# Patient Record
Sex: Female | Born: 1940 | Race: White | Hispanic: No | Marital: Married | Smoking: Never smoker
Health system: Southern US, Community
[De-identification: ages and names within clinical notes are randomized; demographics above are authoritative.]

## PROBLEM LIST (undated history)

## (undated) DIAGNOSIS — E039 Hypothyroidism, unspecified: Secondary | ICD-10-CM

## (undated) DIAGNOSIS — R5381 Other malaise: Secondary | ICD-10-CM

## (undated) DIAGNOSIS — R5383 Other fatigue: Secondary | ICD-10-CM

## (undated) DIAGNOSIS — Z85828 Personal history of other malignant neoplasm of skin: Secondary | ICD-10-CM

## (undated) DIAGNOSIS — R0789 Other chest pain: Secondary | ICD-10-CM

## (undated) DIAGNOSIS — K5730 Diverticulosis of large intestine without perforation or abscess without bleeding: Secondary | ICD-10-CM

## (undated) DIAGNOSIS — K649 Unspecified hemorrhoids: Secondary | ICD-10-CM

## (undated) DIAGNOSIS — I10 Essential (primary) hypertension: Secondary | ICD-10-CM

## (undated) DIAGNOSIS — F411 Generalized anxiety disorder: Secondary | ICD-10-CM

## (undated) DIAGNOSIS — M810 Age-related osteoporosis without current pathological fracture: Secondary | ICD-10-CM

## (undated) HISTORY — PX: OTHER SURGICAL HISTORY: SHX169

## (undated) HISTORY — DX: Unspecified hemorrhoids: K64.9

## (undated) HISTORY — DX: Other chest pain: R07.89

## (undated) HISTORY — DX: Age-related osteoporosis without current pathological fracture: M81.0

## (undated) HISTORY — PX: COLONOSCOPY: SHX174

## (undated) HISTORY — DX: Hypothyroidism, unspecified: E03.9

## (undated) HISTORY — DX: Personal history of other malignant neoplasm of skin: Z85.828

## (undated) HISTORY — DX: Generalized anxiety disorder: F41.1

## (undated) HISTORY — DX: Diverticulosis of large intestine without perforation or abscess without bleeding: K57.30

## (undated) HISTORY — DX: Other fatigue: R53.83

## (undated) HISTORY — DX: Other malaise: R53.81

---

## 1997-09-10 ENCOUNTER — Ambulatory Visit (HOSPITAL_COMMUNITY): Admission: RE | Admit: 1997-09-10 | Discharge: 1997-09-10 | Payer: Self-pay | Admitting: *Deleted

## 1997-09-10 ENCOUNTER — Other Ambulatory Visit: Admission: RE | Admit: 1997-09-10 | Discharge: 1997-09-10 | Payer: Self-pay | Admitting: *Deleted

## 1998-05-05 ENCOUNTER — Ambulatory Visit (HOSPITAL_COMMUNITY): Admission: RE | Admit: 1998-05-05 | Discharge: 1998-05-05 | Payer: Self-pay | Admitting: Internal Medicine

## 1998-05-05 ENCOUNTER — Encounter: Payer: Self-pay | Admitting: Internal Medicine

## 1998-09-17 ENCOUNTER — Other Ambulatory Visit: Admission: RE | Admit: 1998-09-17 | Discharge: 1998-09-17 | Payer: Self-pay | Admitting: *Deleted

## 2000-05-09 ENCOUNTER — Other Ambulatory Visit: Admission: RE | Admit: 2000-05-09 | Discharge: 2000-05-09 | Payer: Self-pay | Admitting: *Deleted

## 2000-05-17 ENCOUNTER — Ambulatory Visit (HOSPITAL_COMMUNITY): Admission: RE | Admit: 2000-05-17 | Discharge: 2000-05-17 | Payer: Self-pay | Admitting: Internal Medicine

## 2000-05-17 ENCOUNTER — Encounter: Payer: Self-pay | Admitting: Internal Medicine

## 2002-08-20 ENCOUNTER — Ambulatory Visit (HOSPITAL_COMMUNITY): Admission: RE | Admit: 2002-08-20 | Discharge: 2002-08-20 | Payer: Self-pay | Admitting: Pulmonary Disease

## 2002-08-20 ENCOUNTER — Encounter: Payer: Self-pay | Admitting: Pulmonary Disease

## 2004-03-18 ENCOUNTER — Ambulatory Visit: Payer: Self-pay | Admitting: Pulmonary Disease

## 2004-11-18 ENCOUNTER — Ambulatory Visit: Payer: Self-pay | Admitting: Pulmonary Disease

## 2004-12-24 ENCOUNTER — Ambulatory Visit: Payer: Self-pay | Admitting: Pulmonary Disease

## 2005-12-21 ENCOUNTER — Ambulatory Visit: Payer: Self-pay | Admitting: Pulmonary Disease

## 2006-01-18 ENCOUNTER — Ambulatory Visit: Payer: Self-pay | Admitting: Pulmonary Disease

## 2006-01-18 ENCOUNTER — Ambulatory Visit: Payer: Self-pay | Admitting: Internal Medicine

## 2006-01-19 ENCOUNTER — Ambulatory Visit: Payer: Self-pay

## 2006-11-29 ENCOUNTER — Ambulatory Visit: Payer: Self-pay | Admitting: Pulmonary Disease

## 2006-12-03 ENCOUNTER — Emergency Department (HOSPITAL_COMMUNITY): Admission: EM | Admit: 2006-12-03 | Discharge: 2006-12-03 | Payer: Self-pay | Admitting: Emergency Medicine

## 2007-05-03 DIAGNOSIS — E039 Hypothyroidism, unspecified: Secondary | ICD-10-CM

## 2007-05-03 DIAGNOSIS — R5383 Other fatigue: Secondary | ICD-10-CM

## 2007-05-03 DIAGNOSIS — F411 Generalized anxiety disorder: Secondary | ICD-10-CM

## 2007-05-03 DIAGNOSIS — R5381 Other malaise: Secondary | ICD-10-CM | POA: Insufficient documentation

## 2007-05-03 DIAGNOSIS — K649 Unspecified hemorrhoids: Secondary | ICD-10-CM

## 2007-05-04 ENCOUNTER — Ambulatory Visit: Payer: Self-pay | Admitting: Pulmonary Disease

## 2007-05-04 DIAGNOSIS — K5730 Diverticulosis of large intestine without perforation or abscess without bleeding: Secondary | ICD-10-CM

## 2007-05-04 DIAGNOSIS — Z85828 Personal history of other malignant neoplasm of skin: Secondary | ICD-10-CM

## 2007-05-04 DIAGNOSIS — R0789 Other chest pain: Secondary | ICD-10-CM | POA: Insufficient documentation

## 2007-05-09 LAB — CONVERTED CEMR LAB
ALT: 19 U/L
AST: 35 U/L
Albumin: 4.1 g/dL
Alkaline Phosphatase: 55 U/L
BUN: 12 mg/dL
Basophils Absolute: 0 K/uL
Basophils Relative: 1 %
Bilirubin, Direct: 0.6 mg/dL — ABNORMAL HIGH
CO2: 30 meq/L
Calcium: 9.5 mg/dL
Chloride: 99 meq/L
Cholesterol: 199 mg/dL
Creatinine, Ser: 0.8 mg/dL
Eosinophils Absolute: 0.1 K/uL
Eosinophils Relative: 1.6 %
GFR calc Af Amer: 92 mL/min
GFR calc non Af Amer: 76 mL/min
Glucose, Bld: 116 mg/dL — ABNORMAL HIGH
HCT: 39 %
HDL: 74.5 mg/dL
Hemoglobin: 13.1 g/dL
Hgb A1c MFr Bld: 5.7 %
LDL Cholesterol: 107 mg/dL — ABNORMAL HIGH
Lymphocytes Relative: 36.8 %
MCHC: 33.7 g/dL
MCV: 92.2 fL
Monocytes Absolute: 0.3 K/uL
Monocytes Relative: 8.2 %
Neutro Abs: 2.3 K/uL
Neutrophils Relative %: 52.4 %
Platelets: 194 K/uL
Potassium: 4.5 meq/L
RBC: 4.23 M/uL
RDW: 12.5 %
Sodium: 136 meq/L
TSH: 1.45 u[IU]/mL
Total Bilirubin: 1.5 mg/dL — ABNORMAL HIGH
Total CHOL/HDL Ratio: 2.7
Total Protein: 7 g/dL
Triglycerides: 88 mg/dL
VLDL: 18 mg/dL
WBC: 4.2 10*3/microliter — ABNORMAL LOW

## 2008-01-02 ENCOUNTER — Telehealth (INDEPENDENT_AMBULATORY_CARE_PROVIDER_SITE_OTHER): Payer: Self-pay | Admitting: *Deleted

## 2008-01-28 ENCOUNTER — Encounter: Payer: Self-pay | Admitting: Pulmonary Disease

## 2008-02-07 ENCOUNTER — Ambulatory Visit: Payer: Self-pay | Admitting: Pulmonary Disease

## 2008-02-07 LAB — CONVERTED CEMR LAB
BUN: 13 mg/dL (ref 6–23)
CO2: 29 meq/L (ref 19–32)
Calcium: 9.7 mg/dL (ref 8.4–10.5)
Chloride: 104 meq/L (ref 96–112)
Cholesterol: 183 mg/dL (ref 0–200)
Creatinine, Ser: 0.7 mg/dL (ref 0.4–1.2)
GFR calc Af Amer: 107 mL/min
GFR calc non Af Amer: 88 mL/min
Glucose, Bld: 116 mg/dL — ABNORMAL HIGH (ref 70–99)
HDL: 86.3 mg/dL (ref >39.0)
LDL Cholesterol: 83 mg/dL (ref 0–99)
Potassium: 4.1 meq/L (ref 3.5–5.1)
Sodium: 140 meq/L (ref 135–145)
TSH: 0.15 microintl units/mL — ABNORMAL LOW (ref 0.35–5.50)
Total CHOL/HDL Ratio: 2.1
Triglycerides: 68 mg/dL (ref 0–149)
VLDL: 14 mg/dL (ref 0–40)

## 2008-02-26 ENCOUNTER — Telehealth (INDEPENDENT_AMBULATORY_CARE_PROVIDER_SITE_OTHER): Payer: Self-pay | Admitting: *Deleted

## 2008-03-11 ENCOUNTER — Telehealth: Payer: Self-pay | Admitting: Pulmonary Disease

## 2008-03-25 ENCOUNTER — Ambulatory Visit: Payer: Self-pay | Admitting: Pulmonary Disease

## 2008-03-31 ENCOUNTER — Encounter: Admission: RE | Admit: 2008-03-31 | Discharge: 2008-03-31 | Payer: Self-pay | Admitting: Pulmonary Disease

## 2008-03-31 LAB — CONVERTED CEMR LAB: TSH: 0.08 microintl units/mL — ABNORMAL LOW (ref 0.35–5.50)

## 2008-04-29 ENCOUNTER — Ambulatory Visit: Payer: Self-pay | Admitting: Pulmonary Disease

## 2008-05-01 ENCOUNTER — Ambulatory Visit (HOSPITAL_COMMUNITY): Admission: RE | Admit: 2008-05-01 | Discharge: 2008-05-01 | Payer: Self-pay | Admitting: Pulmonary Disease

## 2008-05-06 LAB — CONVERTED CEMR LAB
ALT: 23 units/L (ref 0–35)
Albumin: 4.1 g/dL (ref 3.5–5.2)
Basophils Relative: 1 % (ref 0.0–3.0)
Bilirubin, Direct: 0.1 mg/dL (ref 0.0–0.3)
CO2: 32 meq/L (ref 19–32)
Chloride: 103 meq/L (ref 96–112)
Creatinine, Ser: 0.8 mg/dL (ref 0.4–1.2)
Eosinophils Absolute: 0.1 10*3/uL (ref 0.0–0.7)
Eosinophils Relative: 1.1 % (ref 0.0–5.0)
HCT: 40 % (ref 36.0–46.0)
Hemoglobin: 13.8 g/dL (ref 12.0–15.0)
Hgb A1c MFr Bld: 5.8 % (ref 4.6–6.5)
MCHC: 34.5 g/dL (ref 30.0–36.0)
MCV: 90.7 fL (ref 78.0–100.0)
Monocytes Absolute: 0.3 10*3/uL (ref 0.1–1.0)
Neutro Abs: 3 10*3/uL (ref 1.4–7.7)
Neutrophils Relative %: 62.3 % (ref 43.0–77.0)
Potassium: 4.7 meq/L (ref 3.5–5.1)
RBC: 4.42 M/uL (ref 3.87–5.11)
Sodium: 141 meq/L (ref 135–145)
Total Protein: 7.2 g/dL (ref 6.0–8.3)
WBC: 4.9 10*3/uL (ref 4.5–10.5)

## 2009-04-20 ENCOUNTER — Telehealth: Payer: Self-pay | Admitting: Pulmonary Disease

## 2009-05-27 ENCOUNTER — Ambulatory Visit: Payer: Self-pay | Admitting: Pulmonary Disease

## 2009-05-27 ENCOUNTER — Encounter: Payer: Self-pay | Admitting: Adult Health

## 2009-05-29 LAB — CONVERTED CEMR LAB
AST: 20 units/L (ref 0–37)
Alkaline Phosphatase: 57 units/L (ref 39–117)
Basophils Absolute: 0 10*3/uL (ref 0.0–0.1)
Bilirubin, Direct: 0.1 mg/dL (ref 0.0–0.3)
CO2: 32 meq/L (ref 19–32)
Calcium: 9.5 mg/dL (ref 8.4–10.5)
Creatinine, Ser: 0.7 mg/dL (ref 0.4–1.2)
Direct LDL: 107.3 mg/dL
Eosinophils Absolute: 0 10*3/uL (ref 0.0–0.7)
GFR calc non Af Amer: 87.92 mL/min (ref >60)
Glucose, Bld: 111 mg/dL — ABNORMAL HIGH (ref 70–99)
HDL: 76.6 mg/dL (ref >39.00)
Lymphocytes Relative: 29.7 % (ref 12.0–46.0)
MCHC: 34.5 g/dL (ref 30.0–36.0)
Monocytes Relative: 5.4 % (ref 3.0–12.0)
Neutrophils Relative %: 63 % (ref 43.0–77.0)
RBC: 4.48 M/uL (ref 3.87–5.11)
RDW: 13.4 % (ref 11.5–14.6)
Total CHOL/HDL Ratio: 3
Triglycerides: 148 mg/dL (ref 0.0–149.0)
VLDL: 29.6 mg/dL (ref 0.0–40.0)

## 2009-09-16 ENCOUNTER — Encounter (INDEPENDENT_AMBULATORY_CARE_PROVIDER_SITE_OTHER): Payer: Self-pay | Admitting: *Deleted

## 2010-01-15 ENCOUNTER — Telehealth (INDEPENDENT_AMBULATORY_CARE_PROVIDER_SITE_OTHER): Payer: Self-pay | Admitting: *Deleted

## 2010-03-18 NOTE — Progress Notes (Signed)
Summary: lorazepam  Phone Note Call from Patient   Caller: Patient Call For: Iva Montelongo Summary of Call: pt wants rx of lorazepam. she wants 1mg  (so she can split this in half) says the last rx given to her has expired- it was for 0.5mg . [facility] [address]. call pt on cell [phone]. pt wants a 90 days supply as well Initial call taken by: Tivis Ringer, CNA,  April 20, 2009 12:14 PM  Follow-up for Phone Call        please advise if ok to send rx as pt requested. pt last seen 04/29/2008 with no schedueld appts. Carron Curie CMA  April 20, 2009 12:33 PM  ok for the #90 but only for the 0.5mg ---we can not write for the 1 mg she will need ov to discuss this med with SN----thanks Randell Loop CMA  April 20, 2009 12:49 PM   pt advised and is ok with 0.5 mg tabs. i advised pt she needs to schedule appt because it has been 1 year fince appt. I advised she will not be able to get any refills unless she schedules an appt. Sh states she will call to schedue. Rx sent. Carron Curie CMA  April 20, 2009 2:42 PM     Prescriptions: LORAZEPAM 0.5 MG  TABS (LORAZEPAM) take one tablet by mouth at bedtime as needed for sleep...  #90 x 0   Entered by:   Carron Curie CMA   Authorized by:   Michele Mcalpine MD   Signed by:   Carron Curie CMA on 04/20/2009   Method used:   Telephoned to ...       [facility] ([facility]       [address]       [place], [place]  [address]       Ph: [phone]       Fax: [phone]   RxID:   [id number]

## 2010-03-18 NOTE — Assessment & Plan Note (Signed)
Summary: Ashley [name]   CC:  yearly follow up .  History of Present Illness: 70  y/o WF with known history of hypothyroidim, anxiety, osteoporosis.   02/07/08--/follow up visit... she notes that she has had considerable dental problems this year and feels the Actonel was responsible- after stopping the Actonel for the last 2 months things have improved... she had a f/u BMD at he GYN office w/ stable osteopenia and she is going to leave the Actonel off and treat her bones w/ calcium, vitamins, and vit D...  March 25, 2008-had TSH drawn 02-07-08, level was low and synthroid was decreased to but pt is still experiencing discomfort in left throat area, underarm arm area concerned this is related to her thyroid. She complains of multiple complaints today, aching along upper neck shoulder back under arm. nervous lately, does not respond to stress events very well, worrying alot. Husband sick recently w/ a-fib. no chest pain, dyspnea, rash, joint swelling, n/v/d, abd. pain. Has been having heartburn, belching lastely. uses lorazepam 1-2 daily.   May 27, 2009--Presents for Yearly Follow up.  Requesting blood work today - is fasting. She is feeling good. Her and husband have been doing alot of home remodeling. She is very active. Has not been to gym lately but starting back when they finish house worl. She has been under alot of family stress. She needs refills. Would like labs mailed to her. She is due for her routine colonoscopy. Denies chest pain, dyspnea, orthopnea, hemoptysis, fever, n/v/d, edema, headache,bloody stools or weight loss.    Preventive Screening-Counseling & Management  Alcohol-Tobacco     Smoking Status: never  Current Medications (verified): 1)  Adult Aspirin Low Strength 81 Mg  Tbdp (Aspirin) .... Take 1 Tablet By Mouth Once A Day 2)  Synthroid 75 Mcg Tabs (Levothyroxine Sodium) .... Take 1 Tablet By Mouth Once A Day 3)  Lorazepam 0.5 Mg  Tabs (Lorazepam) .... Take One  Tablet By Mouth At Bedtime As Needed For Sleep.Marland KitchenMarland Kitchen 4)  Caltrate 600+d 600-400 Mg-Unit  Tabs (Calcium Carbonate-Vitamin D) .... Take One Tab By Mouth Two Times A Day For Bone Health... 5)  Vitamin D 1000 Unit Caps (Cholecalciferol) .... Take 1 Cap By Mouth Once Daily.Marland KitchenMarland Kitchen 6)  Sertraline Hcl 25 Mg Tabs (Sertraline Hcl) .... Take One Tablet By Mouth Once Daily  Allergies (verified): 1)  ! Penicillin 2)  ! Evista (Raloxifene Hcl) 3)  ! Fosamax (Alendronate Sodium) 4)  ! Actonel (Risedronate Sodium)  Past History:  Family History: Last updated: 05/27/07 Father died age 56 in MVA Mother died age 2 from ovarian cancer 3 Sibs: 1Bro died age 38 w/ pancreatic cancer, hx DM;             1Bro has MS  Social History: Last updated: 05/27/2009 Patient never smoked.  married 2 children no alcohol  Risk Factors: Smoking Status: never (05/27/2009)  Past Medical History: Hx of CHEST PAIN, ATYPICAL (ICD-786.59) - on ASA 81mg /d...  ~  NuclearStressTest 3/02 was normal w/o scar or ischemia, EF=81%.  ~  ABI's 12/07 were normal (c/o pain & numbness in legs).  HYPOTHYROIDISM (ICD-244.9) - ** see above ** on SYNTHROID... she doesn't want generic... on Synthroid x yrs w/ TSH  ~14 in 1983 & med started by DrESL- she had a norm I-131 uptake & scan.   ~  labs 2/10 showed TSH= 0.08... rec- decr to 1/2 tab daily...  ~  labs 3/10 showed = 13, adjusted.   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 f/u planned 42yrs.  OSTEOPOROSIS (ICD-733.00) - prev on ACTONEL 35mg /wk (DC'd 9/09), plus ca++ & vits...  ~  BMD 12/07 showed TScores -0.8 (hips) to -2.0 (spine)... sl improved from 2005 and before.  ~  pt states VitD level was OK per DrMcPhail...  ~  repeat BMD by [facility] showed TScores -1.3 R FemNeck & -2.0 in Spine... she has stopped the Bisphos therapy due to dental  problems and feels she is improved off this med...  FATIGUE (ICD-780.79)  ANXIETY (ICD-300.00) - on LORAZEPAM 0.5mg  as needed... usually takes 1 Qhs...  ~  3/10:  she relates a story where her brother developed DM and later died from pancreatic cancer...  --abd [place] neg. 3/10  SKIN CANCER, HX OF (ICD-V10.83) - DrHall removed a squamous cell lesion from chest wall... SHINGLES - she had a bout of shingles Oct08 on her left hip area, treated at the ER w/ cortisone shot and Famvir (she refused Pred tablets due to her osteopenia)...   HEALTH MAINTENANCE -   ~  FLP 12/07 showed TChol 189, TG 102, HDL 70, LDL 99... on diet alone.  ~  FLP 3/09 showed TChol 199, TG 75, HDL 88, LDL 107...  ~  GYN= [facility] now & she saw Debbora Dus 12/09- all neg per pt.      Social History: Patient never smoked.  married 2 children no alcohol  Review of Systems      See HPI  Vital Signs:  Patient profile:   70 year old female Height:      66 inches Weight:      158.13 pounds BMI:     25.62 O2 Sat:      96 % on Room air Temp:     97.2 degrees F oral Pulse rate:   62 / minute BP sitting:   134 / 84  (left arm) Cuff size:   regular  Vitals Entered By: Gweneth Dimitri RN (May 27, 2009 9:48 AM)  O2 Flow:  Room air CC: yearly follow up  Is Patient Diabetic? No Comments Medications reviewed with patient Daytime contact number verified with patient. Gweneth Dimitri RN  May 27, 2009 9:49 AM    Physical Exam  Additional Exam:  WD, WN, 70 y/o WF in NAD... GENERAL:  Alert & oriented; pleasant & cooperative... HEENT:  [place]/AT, EOM-wnl, PERRLA, EACs-clear, TMs-wnl, NOSE-clear, THROAT-clear & wnl. NECK:  Supple w/ full ROM; no JVD; normal carotid impulses w/o bruits; no thyromegaly or nodules palpated; no lymphadenopathy. CHEST:  Clear to P & A; without wheezes/ rales/ or rhonchi. HEART:  Regular Rhythm; without murmurs/ rubs/ or gallops. ABDOMEN:  Soft & nontender; normal bowel sounds; no  organomegaly or masses detected; +left fem bruit. EXT: without deformities or arthritic changes; no varicose veins/ venous insuffic/ or edema. NEURO:  CN's intact;  no focal neuro deficits... DERM:  No lesions noted; no rash etc...     Impression & Recommendations:  Problem # 1:  HYPOTHYROIDISM (ICD-244.9)  TSH pending today. adjust meds accordingly  Her updated medication list for this problem includes:    Synthroid 75 Mcg Tabs (Levothyroxine sodium) .Marland Kitchen... Take 1 tablet by mouth once a day  Orders: TLB-BMP (Basic Metabolic Panel-BMET) (80048-METABOL) TLB-CBC Platelet - w/Differential (85025-CBCD) TLB-Hepatic/Liver Function Pnl (80076-HEPATIC) TLB-TSH (Thyroid Stimulating Hormone) (84443-TSH) TLB-Lipid Panel (80061-LIPID) Est. Patient  Level IV ([address]) Prescription Created Electronically [phone])  Problem # 2:  OSTEOPOROSIS (ICD-733.00) cont on calcium/ vit d , wt bearing exercises BMD per GYN   Problem # 3:  ANXIETY (ICD-300.00)  she is on longer taking sertraline. only took for short time.  may use ativan as needed .   advised on stress reducers   The following medications were removed from the medication list:    Sertraline Hcl 25 Mg Tabs (Sertraline hcl) .Marland Kitchen... Take one tablet by mouth once daily Her updated medication list for this problem includes:    Lorazepam 0.5 Mg Tabs (Lorazepam) .Marland Kitchen... Take one tablet by mouth at bedtime as needed for sleep...  Orders: Est. Patient Level IV ([address])  Problem # 4:  DIVERTICULOSIS OF COLON (ICD-562.10)  set up for routine colonoscopy.  Orders: TLB-CBC Platelet - w/Differential (85025-CBCD) TLB-Hepatic/Liver Function Pnl (80076-HEPATIC) Est. Patient Level IV ([address])  Labs Reviewed: Hgb: 13.8 (04/29/2008)   Hct: 40.0 (04/29/2008)   WBC: 4.9 (04/29/2008)  Medications Added to Medication List This Visit: 1)  Sertraline Hcl 25 Mg Tabs (Sertraline hcl) .... Take one tablet by mouth once daily  Complete Medication List: 1)   Adult Aspirin Low Strength 81 Mg Tbdp (Aspirin) .... Take 1 tablet by mouth once a day 2)  Synthroid 75 Mcg Tabs (Levothyroxine sodium) .... Take 1 tablet by mouth once a day 3)  Caltrate 600+d 600-400 Mg-unit Tabs (Calcium carbonate-vitamin d) .... Take one tab by mouth two times a day for bone health... 4)  Vitamin D 1000 Unit Caps (Cholecalciferol) .... Take 1 cap by mouth once daily.Marland KitchenMarland Kitchen 5)  Lorazepam 0.5 Mg Tabs (Lorazepam) .... Take one tablet by mouth at bedtime as needed for sleep...  Other Orders: T-Vitamin D (25-Hydroxy) [phone])  Patient Instructions: 1)  We are referring you GI for routine colonoscopy due in May.  2)  Continue for follow up Mammogram and Pap smears-w/ [facility] 3)  Continue on same meds.  4)  I will call with lab results.  5)  Continue on diet and exercise.  6)  follow up Dr. Kriste Basque in 6-12 months and as needed  Prescriptions: SYNTHROID 75 MCG TABS (LEVOTHYROXINE SODIUM) Take 1 tablet by mouth once a day  #90 x 3   Entered and Authorized by:   Rubye Oaks NP   Signed by:   Rubye Oaks NP on 05/27/2009   Method used:   Electronically to        [facility] ([facility]ail)       [address]       [place], [place]  [address]       Ph: [phone]       Fax: [phone]   RxID:   [id number]    Immunization History:  Influenza Immunization History:    Influenza:  historical (11/14/2008)  Pneumovax Immunization History:    Pneumovax:  historical (05/16/2007)

## 2010-03-18 NOTE — Letter (Signed)
Summary: Colonoscopy Letter  [facility]  [address] [place], [place] [address]   Phone: [phone]  Fax: [phone]      September 16, 2009 MRN: [id number]   [facility] [address] [place], [place]  [address]   Dear Ms. Presley,   According to your medical record, it is time for you to schedule a Colonoscopy. The American Cancer Society recommends this procedure as a method to detect early colon cancer. Patients with a family history of colon cancer, or a personal history of colon polyps or inflammatory bowel disease are at increased risk.  This letter has been generated based on the recommendations made at the time of your procedure. If you feel that in your particular situation this may no longer apply, please contact our office.  Please call our office at [phone] to schedule this appointment or to update your records at your earliest convenience.  Thank you for cooperating with [place] to provide you with the very best care possible.   Sincerely,   Barbette Hair. Arlyce Dice, M.D.  [facility] [phone]

## 2010-03-18 NOTE — Progress Notes (Signed)
Summary: prescription > synthroid mailed to pt's home  Phone Note Call from Patient Call back at [facility] Phone [phone]   Caller: Patient Call For: NADEL Summary of Call: Pt states when her synthroid was lowered to ., her symptoms were tiredness, sleepiness, aches in her body, and when she resumed her old rx synthroid , all these symptoms disappeared therefore she wants a written rx for synthroid mailed to her home, pls advise. Initial call taken by: Darletta Moll,  January 15, 2010 9:11 AM  Follow-up for Phone Call        spoke with pt and she states she was on Synthroid but was having some tiredness, sleepiness, and body aches, so she started taking Synthroid . She states she was on this prev and had some left over. She states she has been on the x 3 months now and her symptoms have go away. She is now requesting a rx for symthroid . Please advise. Carron Curie CMA  January 15, 2010 10:19 AM   Additional Follow-up for Phone Call Additional follow up Details #1::        per SN: okay for synthroid , 30 or 90 day supply.  1 by mouth once daily , refill as needed.  called spoke with patient, advised of SN's recs as stated above.  pt verbalized her understanding.  requesting rx be mailed to her home.  home address verified with patient.  rx signed by SN and placed in the mail. Boone Master CNA/MA  January 15, 2010 4:23 PM     New/Updated Medications: SYNTHROID 112 MCG TABS (LEVOTHYROXINE SODIUM) Take 1 tablet by mouth once a day Prescriptions: SYNTHROID 112 MCG TABS (LEVOTHYROXINE SODIUM) Take 1 tablet by mouth once a day  #90 x 4   Entered by:   Boone Master CNA/MA   Authorized by:   Michele Mcalpine MD   Signed by:   Boone Master CNA/MA on 01/15/2010   Method used:   Print then Give to Patient   RxID:   [id number]

## 2010-04-27 ENCOUNTER — Telehealth (INDEPENDENT_AMBULATORY_CARE_PROVIDER_SITE_OTHER): Payer: Self-pay | Admitting: *Deleted

## 2010-04-29 ENCOUNTER — Encounter: Payer: Self-pay | Admitting: Adult Health

## 2010-04-29 ENCOUNTER — Ambulatory Visit (INDEPENDENT_AMBULATORY_CARE_PROVIDER_SITE_OTHER): Payer: Medicare Other | Admitting: Adult Health

## 2010-04-29 DIAGNOSIS — R030 Elevated blood-pressure reading, without diagnosis of hypertension: Secondary | ICD-10-CM

## 2010-05-04 ENCOUNTER — Telehealth: Payer: Self-pay | Admitting: Pulmonary Disease

## 2010-05-04 NOTE — Telephone Encounter (Signed)
Pt was just here on 04/29/10 and was going to keep log of b/p  It is fine for her to come in for bmet  But if she wants rx will need ov

## 2010-05-04 NOTE — Telephone Encounter (Signed)
Called and spoke with patient and per TP pt will come in on 3-21 for BMP to check her sugar---pt voiced her understanding of this.

## 2010-05-04 NOTE — Telephone Encounter (Signed)
LMOMTCB

## 2010-05-04 NOTE — Telephone Encounter (Signed)
Called and spoke with pt and she states she has an apt with Dr. Kriste Basque 4/12 and wants top go ahead and haver her labs done. Pt states this is bc she has been waking up in the mornings and "feeling strange in her head" and feels shaky and weak during the day. Pt states her BP has been staying high aroung 152/90. Pt wanted to come in and just have her blood sugar checked but i informed her we do not do do that unless she comes in for ov. Pt states then she would like to have her labs done. Please advise Dr. Kriste Basque if okay for pt to have labs done. Thanks Carver [place], [place]

## 2010-05-04 NOTE — Progress Notes (Signed)
Summary: bp slightly elevated wants to know if she should come in before   Phone Note Call from Patient   Caller: Patient Call For: NADEL Summary of Call: Patient phoned she has an appt with Dr Kriste Basque on 4/12 but her BP has been slightoly elevated running around 160/82 to 140/75 but mostly in the 140 range. She stated that she has had some stressful family issues. She wants to know if she needs to come in and have it checked or if it can wait until her April 12 visit. Patient can be reached at [phone] Initial call taken by: Vedia Coffer,  April 27, 2010 10:53 AM  Follow-up for Phone Call        Spoke with pt.  She states that she is having elevated BP readings.  She states that the diastolic number is okay but systolic is usually in the upper 160's.  She states that she does not feel "too bad"- but states does not feel like herself and sometimes feels tigh in her chest.  No vision changes or HA.  I advised needs ov and so sched her to see TP at 9:15 on 3/15 and advised call sooner if it gets worse or go to ED if needed. Pt verbalized understanding.  Follow-up by: Vernie Murders,  April 27, 2010 11:02 AM

## 2010-05-05 ENCOUNTER — Ambulatory Visit: Payer: Medicare Other

## 2010-05-05 DIAGNOSIS — I10 Essential (primary) hypertension: Secondary | ICD-10-CM

## 2010-05-05 LAB — BASIC METABOLIC PANEL
BUN: 14 mg/dL (ref 6–23)
Calcium: 9.6 mg/dL (ref 8.4–10.5)
Chloride: 102 mEq/L (ref 96–112)
Creatinine, Ser: 0.7 mg/dL (ref 0.4–1.2)
GFR: 83.53 mL/min (ref >60.00)

## 2010-05-06 ENCOUNTER — Telehealth: Payer: Self-pay | Admitting: *Deleted

## 2010-05-06 NOTE — Telephone Encounter (Signed)
Called and spoke with pt about her lab results per SN---BMP is normal----kidney function and bs were normal.  This lab was done at pts request.

## 2010-05-13 NOTE — Assessment & Plan Note (Signed)
Summary: Acute NP office visit - HTN   CC:  elevated blood pressure .  History of Present Illness: 70  y/o WF with known history of hypothyroidim, anxiety, osteoporosis.   May 03, 2010 --Presents for work in visit. Complains of that blood pressure has been running high at times. She checks several times a day with labile readings 120-170/80-100. No associated headache, visual changes, chest pain or dyspnea. Denies caffeine, sudafed or NSAIDS. Denies chest pain, dyspnea, orthopnea, hemoptysis, fever, n/v/d, edema, headache. Per chart review b/p averaging 130-140/80-88 on past visits.   Medications Prior to Update: 1)  Adult Aspirin Low Strength 81 Mg  Tbdp (Aspirin) .... Take 1 Tablet By Mouth Once A Day 2)  Synthroid 112 Mcg Tabs (Levothyroxine Sodium) .... Take 1 Tablet By Mouth Once A Day 3)  Caltrate 600+d 600-400 Mg-Unit  Tabs (Calcium Carbonate-Vitamin D) .... Take One Tab By Mouth Two Times A Day For Bone Health... 4)  Vitamin D 1000 Unit Caps (Cholecalciferol) .... Take 1 Cap By Mouth Once Daily.Marland KitchenMarland Kitchen 5)  Lorazepam 0.5 Mg  Tabs (Lorazepam) .... Take One Tablet By Mouth At Bedtime As Needed For Sleep...  Current Medications (verified): 1)  Adult Aspirin Low Strength 81 Mg  Tbdp (Aspirin) .... Take 1 Tablet By Mouth Once A Day 2)  Synthroid 112 Mcg Tabs (Levothyroxine Sodium) .... Take 1 Tablet By Mouth Once A Day 3)  Caltrate 600+d 600-400 Mg-Unit  Tabs (Calcium Carbonate-Vitamin D) .... Take One Tab By Mouth Two Times A Day For Bone Health... 4)  Vitamin D 1000 Unit Caps (Cholecalciferol) .... Take 1 Cap By Mouth Once Daily.Marland KitchenMarland Kitchen 5)  Lorazepam 0.5 Mg  Tabs (Lorazepam) .... Take One Tablet By Mouth At Bedtime As Needed For Sleep...  Allergies (verified): 1)  ! Penicillin 2)  ! Evista (Raloxifene Hcl) 3)  ! Fosamax (Alendronate Sodium) 4)  ! Actonel (Risedronate Sodium)  Past History:  Family History: Last updated: 18-May-2007 Father died age 58 in MVA Mother died age 47 from  ovarian cancer 3 Sibs: 1Bro died age 91 w/ pancreatic cancer, hx DM;             1Bro has MS  Social History: Last updated: 05/27/2009 Patient never smoked.  married 2 children no alcohol  Risk Factors: Smoking Status: never (05/27/2009)  Past Medical History: Hx of CHEST PAIN, ATYPICAL (ICD-786.59) - on ASA 81mg /d...  ~  NuclearStressTest 3/02 was normal w/o scar or ischemia, EF=81%.  ~  ABI's 12/07 were normal (c/o pain & numbness in legs).  HYPOTHYROIDISM (ICD-244.9) - ** see above ** on SYNTHROID... she doesn't want generic... on Synthroid x yrs w/ TSH  ~14 in 1983 & med started by DrESL- she had a norm I-131 uptake & scan.   ~  labs 2/10 showed TSH= 0.08... rec- decr to 1/2 tab daily...  ~  labs 3/10 showed = 13, adjusted.   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 f/u planned 11yrs.  OSTEOPOROSIS (ICD-733.00) - prev on ACTONEL 35mg /wk (DC'd 9/09), plus ca++ & vits...  ~  BMD 12/07 showed TScores -0.8 (hips) to -2.0 (spine)... sl improved from 2005 and before.  ~  pt states VitD level was OK per DrMcPhail...  ~  repeat BMD by [facility] showed TScores -1.3 R FemNeck & -2.0 in Spine... she has stopped the Bisphos therapy due to dental problems  and feels she is improved off this med...  FATIGUE (ICD-780.79)  ANXIETY (ICD-300.00) - on LORAZEPAM 0.5mg  as needed... usually takes 1 Qhs...   SKIN CANCER, HX OF (ICD-V10.83) - DrHall removed a squamous cell lesion from chest wall... SHINGLES - she had a bout of shingles Oct08 on her left hip area, treated at the ER w/ cortisone shot and Famvir (she refused Pred tablets due to her osteopenia)...   HEALTH MAINTENANCE -   ~  FLP 12/07 showed TChol 189, TG 102, HDL 70, LDL 99... on diet alone.  ~  FLP 3/09 showed TChol 199, TG 75, HDL 88, LDL 107...  ~  GYN= [facility] now & she saw Debbora Dus 12/09- all neg per pt.      Review of Systems      See HPI  Vital Signs:  Patient profile:   70 year old female Height:      66 inches Weight:      158.13 pounds BMI:     25.62 O2 Sat:      100 % on Room air Temp:     97.0 degrees F oral Pulse rate:   63 / minute BP sitting:   132 / 84  (left arm) Cuff size:   regular  Vitals Entered By: Boone Master CNA/MA (April 29, 2010 9:44 AM)  O2 Flow:  Room air  Physical Exam  Additional Exam:  WD, WN, 70 y/o WF in NAD... GENERAL:  Alert & oriented; pleasant & cooperative... HEENT:  [place]/AT, EOM-wnl, PERRLA, EACs-clear, TMs-wnl, NOSE-clear, THROAT-clear & wnl. NECK:  Supple w/ full ROM; no JVD; normal carotid impulses w/o bruits; no thyromegaly or nodules palpated; no lymphadenopathy. CHEST:  Clear to P & A; without wheezes/ rales/ or rhonchi. HEART:  Regular Rhythm; without murmurs/ rubs/ or gallops. ABDOMEN:  Soft & nontender; normal bowel sounds; no organomegaly or masses detected; +left fem bruit. EXT: without deformities or arthritic changes; no varicose veins/ venous insuffic/ or edema. NEURO:  CN's intact;  no focal neuro deficits... DERM:  No lesions noted; no rash etc...     Impression & Recommendations:  Problem # 1:  ELEVATED BLOOD PRESSURE (ICD-796.2)  we discussed several options, verified her b/p machine.  no meds at this time will keep log at home and bring to next ov.  plan:  Check blood pressure daily, record in log.  Avoid ibuprofen, advil, aleve, etc Low salt diet Exercise and walking as tolerated.  follow up Dr. Kriste Basque in 2 weeks as planned and as needed  Please contact office for sooner follow up if symptoms do not improve or worsen   Orders: Est. Patient Level III (119[address])  Patient Instructions: 1)  Check blood pressure daily, record in log.  2)  Avoid ibuprofen, advil, aleve, etc 3)  Low salt diet 4)  Exercise and walking as tolerated.  5)  follow up Dr. Kriste Basque in 2 weeks as planned and as  needed  6)  Please contact office for sooner follow up if symptoms do not improve or worsen    Immunization History:  Influenza Immunization History:    Influenza:  historical (11/14/2009)

## 2010-05-25 ENCOUNTER — Encounter: Payer: Self-pay | Admitting: Pulmonary Disease

## 2010-05-27 ENCOUNTER — Encounter: Payer: Self-pay | Admitting: Pulmonary Disease

## 2010-05-27 ENCOUNTER — Other Ambulatory Visit (INDEPENDENT_AMBULATORY_CARE_PROVIDER_SITE_OTHER): Payer: Medicare Other

## 2010-05-27 ENCOUNTER — Ambulatory Visit (INDEPENDENT_AMBULATORY_CARE_PROVIDER_SITE_OTHER): Payer: Medicare Other | Admitting: Pulmonary Disease

## 2010-05-27 DIAGNOSIS — E7800 Pure hypercholesterolemia, unspecified: Secondary | ICD-10-CM

## 2010-05-27 DIAGNOSIS — R5381 Other malaise: Secondary | ICD-10-CM

## 2010-05-27 DIAGNOSIS — R030 Elevated blood-pressure reading, without diagnosis of hypertension: Secondary | ICD-10-CM

## 2010-05-27 DIAGNOSIS — E039 Hypothyroidism, unspecified: Secondary | ICD-10-CM

## 2010-05-27 DIAGNOSIS — R5383 Other fatigue: Secondary | ICD-10-CM

## 2010-05-27 LAB — CBC WITH DIFFERENTIAL/PLATELET
Basophils Absolute: 0 10*3/uL (ref 0.0–0.1)
Basophils Relative: 0.8 % (ref 0.0–3.0)
Eosinophils Absolute: 0 10*3/uL (ref 0.0–0.7)
Hemoglobin: 14.7 g/dL (ref 12.0–15.0)
Lymphocytes Relative: 31.3 % (ref 12.0–46.0)
MCHC: 34.5 g/dL (ref 30.0–36.0)
MCV: 92.8 fl (ref 78.0–100.0)
Monocytes Absolute: 0.3 10*3/uL (ref 0.1–1.0)
Neutro Abs: 2.7 10*3/uL (ref 1.4–7.7)
RDW: 12.8 % (ref 11.5–14.6)

## 2010-05-27 LAB — BASIC METABOLIC PANEL
BUN: 17 mg/dL (ref 6–23)
Calcium: 9.8 mg/dL (ref 8.4–10.5)
Creatinine, Ser: 0.7 mg/dL (ref 0.4–1.2)
GFR: 90.65 mL/min (ref >60.00)

## 2010-05-27 LAB — HEPATIC FUNCTION PANEL: Total Bilirubin: 0.6 mg/dL (ref 0.3–1.2)

## 2010-05-27 LAB — LIPID PANEL
Cholesterol: 195 mg/dL (ref 0–200)
LDL Cholesterol: 93 mg/dL (ref 0–99)
Triglycerides: 110 mg/dL (ref 0.0–149.0)
VLDL: 22 mg/dL (ref 0.0–40.0)

## 2010-05-27 MED ORDER — LORAZEPAM 1 MG PO TABS: 1.0000 mg | ORAL_TABLET | Freq: Three times a day (TID) | ORAL | Status: DC | PRN

## 2010-05-27 MED ORDER — LORAZEPAM 0.5 MG PO TABS: ORAL_TABLET | ORAL | Status: DC

## 2010-05-27 MED ORDER — LEVOTHYROXINE SODIUM 112 MCG PO TABS: 112.0000 ug | ORAL_TABLET | Freq: Every day | ORAL | Status: DC

## 2010-05-27 NOTE — Patient Instructions (Signed)
Today we updated your meds in our EPIC system...    We refilled Your Lorazepam & Levothyroid as we discussed...  Today we did your follow up Fasting blood work...    We will mail a copy to you for your records...  Try to continue your aerobic activity, it is also a great stress reliever...    Try to monitor your BP at home & call if you find the readings are consistantly >150/90...    Call for any questions...    Let's plan a routine follow up in 1 yr, but sooner if needed.Marland KitchenMarland Kitchen

## 2010-05-27 NOTE — Progress Notes (Signed)
Subjective:    Patient ID: Ashley [name], female    DOB: [birth year], 70 y.o.   MRN: [id number]  HPI 70 y/o WF here for a follow up visit...  She has mult medical problems including:  Hypothyroidism;  Divertics/ Hems;  DJD/ Osteopenia;  Anxiety...  ~  May 27, 2010:  2 yr ROV and she has been under a lot of stress w/ daughter's divorce "it's taking a toll on me";  She was having weak spells & BP sl labile but has settled at 140s/ 80s at home checks;  She notes that for the weak spells she will go to sleep for relief & wakes feeling better, also eating more freq smaller meals helps "even eating celery helps";  She requests increase in her Ativan Rx, fasting blood work, and Synthroid refill today...  Also c/o some right shoulder discomfort & inability to fully abduct right arm- [facility]- we discussed Ortho eval (she will call DrWainer when she is ready for this)...     Problem List:  Hx of CHEST PAIN, ATYPICAL (ICD-786.59) - on ASA 81mg /d... ~  NuclearStressTest 3/02 was normal w/o scar or ischemia, EF=81%. ~  ABI's 12/07 were normal (c/o pain & numbness in legs). ~  4/12:  She denies CP, palpit, dyspnea, syncope, edema, etc...  HYPOTHYROIDISM (ICD-244.9) - currently on SYNTHROID 163mcg/d she doesn't want generic...  ~  on Synthroid Rx x yrs w/ TSH ~14 in 1983 & med started by DrESL. ~  Thyroid Ultrasound 3/10 showed = sl goiter, no nodules seen. ~  Labs & Synthroid dose have been adjusted slightly over the yrs... ~  Labs 4/12 on Synth112 showed TSH= 0.30... rec sl decr to 125mcg/d.  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 f/u planned 51yrs. ~  4/12:  Reminded of the need for her f/u colon & she promises to call GI to set this up at her convenience.  DEGENERATIVE ARTHRITIS (ICD-715.90) - she uses OTC anti-inflamm meds Prn. ~  4/12: c/o right shoulder pain & decr ROM, advised to get  eval by Ortho & she will call DrWainer when she is ready.  OSTEOPOROSIS (ICD-733.00) - prev Actonel was stopped 9/09; taking Ca++/ Vits/ Vit D... ~  BMD 12/07 showed TScores -0.8 (hips) to -2.0 (spine)... sl improved from 2005 and before. ~  pt states VitD level was OK per DrMcPhail... ~  repeat BMD 2009 by [facility] showed TScores -1.3 R FemNeck & -2.0 in Spine... she has stopped the Bisphos therapy due to dental problems and feels she is improved off this med... ~  4/12:  She tells me that GYN continues to follow her BMDs & manages this prob for her.  FATIGUE (ICD-780.79)  ANXIETY (ICD-300.00) - on LORAZEPAM 0.5mg  as needed... usually takes 1 Qhs... ~  3/10:  she relates a story where her brother developed DM and later died from pancreatic cancer... she likes to get copies of all her lab work and noted her BS was 116 & she thinks that she is diabetic and may also have pancreatic cancer causing this... we discussed this in detail & I tried to reassure her--- we will check BS=109/ A1c=5.8/ Abd Ultrasound= WNL.Marland Kitchen. ~  4/12:  Under a lot of stress w/ daughter's divorce> she wants to incr the LORAZEPAM to 1mg  tabs- 1/2 to 1 tab Tid Prn...  SKIN CANCER, HX OF (ICD-V10.83) - DrHall removed a  squamous cell lesion from chest wall... SHINGLES - she had a bout of shingles Oct08 on her left hip area, treated at the ER w/ cortisone shot and Famvir (she refused Pred tablets due to her osteopenia)...  HEALTH MAINTENANCE -  ~  FLP 12/07 showed TChol 189, TG 102, HDL 70, LDL 99... on diet alone. ~  FLP 3/09 showed TChol 199, TG 75, HDL 88, LDL 107... ~  GYN= [facility] now & she saw Debbora Dus 12/09- all neg per pt.  No past surgical history on file.   Outpatient Encounter Prescriptions as of 05/27/2010  Medication Sig Dispense Refill  . aspirin 81 MG tablet Take 81 mg by mouth daily.        . Calcium Carbonate-Vitamin D (CALTRATE 600+D) 600-400 MG-UNIT per tablet Take 1 tablet by mouth 2 (two)  times daily.        . Cholecalciferol (VITAMIN D) 1000 UNITS capsule Take 1,000 Units by mouth daily.        Marland Kitchen levothyroxine (SYNTHROID, LEVOTHROID) 112 MCG tablet Take 112 mcg by mouth daily.    ==> DOSE DECREASED to 124mcg/d this visit...    . LORazepam (ATIVAN) 0.5 MG tablet Take 0.5 mg by mouth at bedtime as needed.    ==> DOSE Increased to 1mg  tabs this visit...      Allergies  Allergen Reactions  . Alendronate Sodium     REACTION: pt states "blisters in my mouth"  . Latex     sensitive  . Penicillins     REACTION: fatigue/malaise, "feels extremely bad"  . Raloxifene     REACTION: pt states "it dried me out"  . Risedronate Sodium     REACTION: pt states "teeth problems"    Review of Systems        See HPI - all other systems neg except as noted...       The patient c/o anxiety, weak spells, & pain in the right shoulder;  She denies anorexia, fever, weight loss, weight gain, vision loss, decreased hearing, hoarseness, chest pain, syncope, dyspnea on exertion, peripheral edema, prolonged cough, headaches, hemoptysis, abdominal pain, melena, hematochezia, severe indigestion/heartburn, hematuria, incontinence, suspicious skin lesions, transient blindness, difficulty walking, depression, unusual weight change, abnormal bleeding, enlarged lymph nodes, and angioedema.     Objective:   Physical Exam      WD, WN, 70 y/o WF in NAD... GENERAL:  Alert & oriented; pleasant & cooperative... HEENT:  [place]/AT, EOM-wnl, PERRLA, EACs-clear, TMs-wnl, NOSE-clear, THROAT-clear & wnl. NECK:  Supple w/ full ROM; no JVD; normal carotid impulses w/o bruits; no thyromegaly or nodules palpated; no lymphadenopathy. CHEST:  Clear to P & A; without wheezes/ rales/ or rhonchi. HEART:  Regular Rhythm; without murmurs/ rubs/ or gallops. ABDOMEN:  Soft & nontender; normal bowel sounds; no organomegaly or masses detected; +left fem bruit. EXT: without deformities, mild arthritic changes & decr ROM right shoulder;  no varicose veins/ venous insuffic/ or edema. NEURO:  CN's intact;  no focal neuro deficits... DERM:  No lesions noted; no rash etc...   Assessment & Plan:   HYPOTHYROIDISM>  She is clinically euthyroid but TSH returns sl over suppressed at 0.30 on the 112 mcg/d dose;  We discussed decr dose to 175mcg/d...  DIVERTICS>  She is overdue for her screening colon, last done 5/01 by Emiliano Dyer;  We offered to set this up for her but she wants to sched her own appt & will call.  OSTEOARTHRITIS>  She has some right shoulder pain &  decr ROM (can't abduct);  We have rec Ortho eval & she will call DrWainer when she is ready for this eval...  OSTEOPENIA>  She is on Caltrate/ MVI/ Vit D & she is followed by her GYN;  Advised to have f/u BMD on sched so they canaddress the need for medication if nec...  ANXIETY>  She requests incr in her Lorazepam to 1mg  tabs- 1/2 to 1 tab  Tid Prn.Marland KitchenMarland Kitchen

## 2010-05-29 ENCOUNTER — Encounter: Payer: Self-pay | Admitting: Pulmonary Disease

## 2010-05-29 MED ORDER — SYNTHROID 100 MCG PO TABS: 100.0000 ug | ORAL_TABLET | Freq: Every day | ORAL | Status: DC

## 2010-06-14 ENCOUNTER — Encounter: Payer: Self-pay | Admitting: Pulmonary Disease

## 2010-06-17 ENCOUNTER — Telehealth: Payer: Self-pay | Admitting: Pulmonary Disease

## 2010-06-17 MED ORDER — AMLODIPINE BESYLATE 5 MG PO TABS: 5.0000 mg | ORAL_TABLET | Freq: Every day | ORAL | Status: DC

## 2010-06-17 NOTE — Telephone Encounter (Signed)
Spoke w/ pt and she states she has been monitoring her BP. Pt states last night it was 190/86 and this morning it was 156/84. Pt states she has been just feeling "bad". Pt states Tammy advised her that if she continued to have this BP problem then she could possibly be put on a low dose BP medication. Pt is just worried that something may happen to her if she continues like this. Pt states if Dr. Kriste Basque wants her to wait and continue to monitor her BP she will before putting her on any bp medication. Please advise Dr. Kriste Basque. Thanks  Allergies  Allergen Reactions  . Alendronate Sodium     REACTION: pt states "blisters in my mouth"  . Latex     sensitive  . Penicillins     REACTION: fatigue/malaise, "feels extremely bad"  . Raloxifene     REACTION: pt states "it dried me out"  . Risedronate Sodium     REACTION: pt states "teeth problems"    Carver Fila, CMA

## 2010-06-17 NOTE — Telephone Encounter (Signed)
lmomtcb x1. RX for amlodipine sent to [facility] on [address].

## 2010-06-17 NOTE — Telephone Encounter (Signed)
Pt called back iand is aware of rx for amlodipine. She was instructed to call with any problems or questions on this medication.

## 2010-06-17 NOTE — Telephone Encounter (Signed)
PT USES [facility] ON [address]

## 2010-06-17 NOTE — Telephone Encounter (Signed)
Per SN--yes looks like its time to start on a low dose anti-hypertensive med---amlodipine 5mg   #30   1 daily  Refill x 11.  thanks

## 2010-08-02 ENCOUNTER — Telehealth: Payer: Self-pay | Admitting: Pulmonary Disease

## 2010-08-02 NOTE — Telephone Encounter (Signed)
Spoke with pt. She states that she has been taking amlodipine for approx 6 wks and her BP has improved a lot, but she is c/o constipation x 2 wks- has to take MOM and dulcolax every day and this helps, but does not want to continue to have to take these meds. She likes the amlodipine, and wants to stay on it, however wants recs from SN of "something natural" she can take to help with the constipation. She states if he wants her to stop med that would be okay also. Please advise thanks!

## 2010-08-02 NOTE — Telephone Encounter (Signed)
Per SN- stay on the amlodipine and takes miralax daily. Spoke with pt and notified of these recs and she verbalized understanding.

## 2010-12-08 ENCOUNTER — Other Ambulatory Visit: Payer: Self-pay | Admitting: *Deleted

## 2010-12-08 MED ORDER — LORAZEPAM 1 MG PO TABS: 1.0000 mg | ORAL_TABLET | Freq: Three times a day (TID) | ORAL | Status: DC | PRN

## 2011-03-21 ENCOUNTER — Telehealth: Payer: Self-pay | Admitting: *Deleted

## 2011-03-21 NOTE — Telephone Encounter (Signed)
LM for pt to r/c

## 2011-03-23 NOTE — Telephone Encounter (Signed)
Pt said she is returning your call. Her daughter is having surgery and then she will be out of town. Will call back in April to sch'd COL

## 2011-04-26 ENCOUNTER — Telehealth: Payer: Self-pay | Admitting: Pulmonary Disease

## 2011-04-26 NOTE — Telephone Encounter (Signed)
She will need to check with travel agency or check on line for any vaccinations that she may need.  thanks

## 2011-04-26 NOTE — Telephone Encounter (Signed)
Spoke with pt. She states traveling to [facility] in 1 month and was told by the HD that there are no vaccines necessarily required, but she should have TDAP. I have ordered paper chart to see when last one was given. Pt states that after doing some research, she thinks she may need to have Hep A shot just to be safe. She knows this is not required, but wants to know what SN thinks. Please advise, thanks

## 2011-04-26 NOTE — Telephone Encounter (Signed)
Spoke with pt and made aware of recs per SN. She states will call and check with travel agency and let [place] know if there is anything needed.

## 2011-04-26 NOTE — Telephone Encounter (Signed)
lmomtcb  

## 2011-04-26 NOTE — Telephone Encounter (Signed)
Pt return call °

## 2011-05-11 ENCOUNTER — Telehealth: Payer: Self-pay | Admitting: Pulmonary Disease

## 2011-05-11 NOTE — Telephone Encounter (Signed)
I spoke with pt and she states she is wanting to come in for TDAP shot. Pt states she knows she had the tetanus 3-4 years ago. Pt is wanting to come in as soon as possible. Please advise Dr. Kriste Basque, thanks

## 2011-05-11 NOTE — Telephone Encounter (Signed)
Per SN---this vaccine is given every 10 years.  Pt is not due for the vaccine at this time if she had this done 3-4 years ago.  thanks

## 2011-05-11 NOTE — Telephone Encounter (Signed)
Spoke with pt-she is thinking she only had a tetanus injection and not a TDAP about 3-4 years ago; she would like for [place] to check our records and make sure. I explained to her that I would have to order her chart and see in that record as EMR doesn't show any records of a tetanus shot. Will hold in Triage until chart comes up.

## 2011-05-12 NOTE — Telephone Encounter (Signed)
Pt aware when the tdap was done and is fine with this

## 2011-05-12 NOTE — Telephone Encounter (Signed)
Pt last tdap was 11/2004.   Her immunizations have been updated.  thanks

## 2011-05-31 ENCOUNTER — Ambulatory Visit (INDEPENDENT_AMBULATORY_CARE_PROVIDER_SITE_OTHER)
Admission: RE | Admit: 2011-05-31 | Discharge: 2011-05-31 | Disposition: A | Discharge status: Home / Self Care | Payer: Medicare Other | Source: Ambulatory Visit | Attending: Pulmonary Disease | Admitting: Pulmonary Disease

## 2011-05-31 ENCOUNTER — Ambulatory Visit (INDEPENDENT_AMBULATORY_CARE_PROVIDER_SITE_OTHER): Payer: Medicare Other | Admitting: Pulmonary Disease

## 2011-05-31 ENCOUNTER — Encounter: Payer: Self-pay | Admitting: Pulmonary Disease

## 2011-05-31 ENCOUNTER — Encounter: Payer: Self-pay | Admitting: Gastroenterology

## 2011-05-31 ENCOUNTER — Other Ambulatory Visit (INDEPENDENT_AMBULATORY_CARE_PROVIDER_SITE_OTHER): Payer: Medicare Other

## 2011-05-31 VITALS — BP 126/82 | HR 72 | Temp 96.9°F | Ht 67.0 in | Wt 156.4 lb

## 2011-05-31 DIAGNOSIS — E039 Hypothyroidism, unspecified: Secondary | ICD-10-CM

## 2011-05-31 DIAGNOSIS — Z85828 Personal history of other malignant neoplasm of skin: Secondary | ICD-10-CM

## 2011-05-31 DIAGNOSIS — K5730 Diverticulosis of large intestine without perforation or abscess without bleeding: Secondary | ICD-10-CM

## 2011-05-31 DIAGNOSIS — M810 Age-related osteoporosis without current pathological fracture: Secondary | ICD-10-CM

## 2011-05-31 DIAGNOSIS — R0789 Other chest pain: Secondary | ICD-10-CM

## 2011-05-31 DIAGNOSIS — F411 Generalized anxiety disorder: Secondary | ICD-10-CM

## 2011-05-31 LAB — BASIC METABOLIC PANEL
BUN: 14 mg/dL (ref 6–23)
Chloride: 102 mEq/L (ref 96–112)
Glucose, Bld: 107 mg/dL — ABNORMAL HIGH (ref 70–99)
Potassium: 4.1 mEq/L (ref 3.5–5.1)
Sodium: 139 mEq/L (ref 135–145)

## 2011-05-31 LAB — HEPATIC FUNCTION PANEL
ALT: 17 U/L (ref 0–35)
AST: 19 U/L (ref 0–37)
Alkaline Phosphatase: 49 U/L (ref 39–117)
Total Bilirubin: 0.4 mg/dL (ref 0.3–1.2)

## 2011-05-31 LAB — CBC WITH DIFFERENTIAL/PLATELET
Eosinophils Relative: 1.5 % (ref 0.0–5.0)
HCT: 41.3 % (ref 36.0–46.0)
Lymphs Abs: 1.4 10*3/uL (ref 0.7–4.0)
MCHC: 33.3 g/dL (ref 30.0–36.0)
MCV: 93.7 fl (ref 78.0–100.0)
Monocytes Absolute: 0.2 10*3/uL (ref 0.1–1.0)
Platelets: 184 10*3/uL (ref 150.0–400.0)
RDW: 13.2 % (ref 11.5–14.6)
WBC: 3.8 10*3/uL — ABNORMAL LOW (ref 4.5–10.5)

## 2011-05-31 LAB — TSH: TSH: 1.33 u[IU]/mL (ref 0.35–5.50)

## 2011-05-31 LAB — LIPID PANEL: VLDL: 28 mg/dL (ref 0.0–40.0)

## 2011-05-31 MED ORDER — LORAZEPAM 1 MG PO TABS: 1.0000 mg | ORAL_TABLET | Freq: Three times a day (TID) | ORAL | Status: DC | PRN

## 2011-05-31 MED ORDER — SYNTHROID 100 MCG PO TABS: 100.0000 ug | ORAL_TABLET | Freq: Every day | ORAL | Status: DC

## 2011-05-31 NOTE — Progress Notes (Signed)
Subjective:    Patient ID: Ashley [name], female    DOB: [birth year], 71 y.o.   MRN: [id number]  HPI 71 y/o WF here for a follow up visit...  She has mult medical problems including:  Hypothyroidism;  Divertics/ Hems;  DJD/ Osteopenia;  Anxiety...  ~  May 27, 2010:  2 yr ROV and she has been under a lot of stress w/ daughter's divorce "it's taking a toll on me";  She was having weak spells & BP sl labile but has settled at 140s/ 80s at home checks;  She notes that for the weak spells she will go to sleep for relief & wakes feeling better, also eating more freq smaller meals helps "even eating celery helps";  She requests increase in her Ativan Rx, fasting blood work, and Synthroid refill today...  Also c/o some right shoulder discomfort & inability to fully abduct right arm- [facility]- we discussed Ortho eval (she will call DrWainer when she is ready for this)...  ~  May 31, 2011:  Yearly ROV & [place] reports that she was Dx w/ overactive bladder by DrOttelin & started on new medication MyrbetriqSR 25mg /d- and it seems to be helping;  She wants me to know that she stopped her Norvasc on her own because she didn't think she needed it (stress level is decr & home BP checks all good in the 120-130 range) & claims it caused constipation;  She denies CP, palpit, dizzy, SOB, edema, etc...  See prob list below>>  We reviewed medical issues, meds, XRays, & lab work... CXR 4/13 showed normal heart size, clear lungs, WNL.Marland KitchenMarland Kitchen LABS 4/13:  FLP- ok on diet;  Chems- ok w/ BS107;  CBC- wnl;  TSH=1.33, VitD=67    Problem List:    << PROBLEM LIST UPDATED 05/31/11 >>  Hx of CHEST PAIN, ATYPICAL (ICD-786.59) - on ASA 81mg /d... ~  NuclearStressTest 3/02 was normal w/o scar or ischemia, EF=81%. ~  ABI's 12/07 were normal (c/o pain & numbness in legs). ~  4/13:  She denies CP, palpit, dyspnea, syncope, edema, etc...  HYPOTHYROIDISM (ICD-244.9) - currently on SYNTHROID 173mcg/d she doesn't want  generic...  ~  on Synthroid Rx x yrs w/ TSH ~14 in 1983 & med started by DrESL. ~  Thyroid Ultrasound 3/10 showed = sl goiter, no nodules seen. ~  Labs & Synthroid dose have been adjusted slightly over the yrs... ~  Labs 4/12 on Synth112 showed TSH= 0.30... rec sl decr to 158mcg/d. ~  Labs 4/13 on Synth100 showed TSH= 1.33... Continue same.  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 f/u planned 37yrs. ~  4/12 & 4/13:  Reminded of the need for her f/u colon & she promises to call GI to set this up at her convenience.  DEGENERATIVE ARTHRITIS (ICD-715.90) - she uses OTC anti-inflamm meds Prn. ~  4/12: c/o right shoulder pain & decr ROM, advised to get eval by Ortho==>DrWainer, Dx rotator cuff tendonitis vs part tear; given shot, PT, Mobic15, & consider MRI. ~  4/13:  She reports improved, no problem...  OSTEOPOROSIS (ICD-733.00) - prev Actonel was stopped 9/09; taking Ca++/ Vits/ Vit D... ~  BMD 12/07 showed TScores -0.8 (hips) to -2.0 (spine)... sl improved from 2005 and before. ~  pt states VitD level was OK per DrMcPhail... ~  repeat BMD 12/09 by [facility] showed TScores -1.3 R FemNeck & -2.0 in Spine.Marland KitchenMarland Kitchen  she has stopped the Bisphos therapy due to dental problems and feels she is improved off this med... ~  4/12:  She tells me that GYN continues to follow her BMDs & manages this prob for her; she refuses all bisphos therapies...  FATIGUE (ICD-780.79)  ANXIETY (ICD-300.00) - on LORAZEPAM 1mg  as needed... usually takes 1 Qhs... ~  3/10:  she relates a story where her brother developed DM and later died from pancreatic cancer... she likes to get copies of all her lab work and noted her BS was 116 & she thinks that she is diabetic and may also have pancreatic cancer causing this... we discussed this in detail & I tried to reassure her--- we will check BS=109/ A1c=5.8/ Abd Ultrasound= WNL.Marland Kitchen. ~  4/12:   Under a lot of stress w/ daughter's divorce> she wants to incr the LORAZEPAM to 1mg  tabs- 1/2 to 1 tab Tid Prn... ~  4/13:  She notes that stress is diminished but still taking ativan 1mg  Qhs for sleep...  SKIN CANCER, HX OF (ICD-V10.83) - DrHall removed a squamous cell lesion from chest wall... SHINGLES - she had a bout of shingles Oct08 on her left hip area, treated at the ER w/ cortisone shot and Famvir (she refused Pred tablets due to her osteopenia)...  HEALTH MAINTENANCE -  ~  FLP 4/13 showed TChol 201, TG 140, HDL 79, LDL 97... on diet alone. ~  GYN= [facility] now & she saw Debbora Dus for PAP etc... ~  Urology, DrOttelin for overactive bladder eval & Rx w/ Myrbetriq... ~  Immuniz:  She gets the yearly seasonal Flu vaccines...  No past surgical history on file.   Outpatient Encounter Prescriptions as of 05/31/2011  Medication Sig Dispense Refill  . aspirin 81 MG tablet Take 81 mg by mouth daily.        . Calcium Carbonate-Vitamin D (CALTRATE 600+D) 600-400 MG-UNIT per tablet Take 1 tablet by mouth 2 (two) times daily.        . Cholecalciferol (VITAMIN D) 1000 UNITS capsule Take 1,000 Units by mouth daily.        Marland Kitchen LORazepam (ATIVAN) 1 MG tablet Take 1 tablet (1 mg total) by mouth 3 (three) times daily as needed.  90 tablet  3  . Mirabegron ER (MYRBETRIQ) 25 MG TB24 Take 1 tablet by mouth daily.      Marland Kitchen SYNTHROID 100 MCG tablet Take 1 tablet (100 mcg total) by mouth daily. BRAND NAME ONLY- per pt request.  90 tablet  3  . amLODipine (NORVASC) 5 MG tablet  she stopped NORVASC on her own in 2012...      Allergies  Allergen Reactions  . Alendronate Sodium     REACTION: pt states "blisters in my mouth"  . Latex     sensitive  . Penicillins     REACTION: fatigue/malaise, "feels extremely bad"  . Raloxifene     REACTION: pt states "it dried me out"  . Risedronate Sodium     REACTION: pt states "teeth problems"    Review of Systems        See HPI - all other systems neg  except as noted...       The patient c/o anxiety, weak spells, & pain in the right shoulder;  She denies anorexia, fever, weight loss, weight gain, vision loss, decreased hearing, hoarseness, chest pain, syncope, dyspnea on exertion, peripheral edema, prolonged cough, headaches, hemoptysis, abdominal pain, melena, hematochezia, severe indigestion/heartburn, hematuria, incontinence, suspicious skin lesions, transient  blindness, difficulty walking, depression, unusual weight change, abnormal bleeding, enlarged lymph nodes, and angioedema.     Objective:   Physical Exam      WD, WN, 71 y/o WF in NAD... GENERAL:  Alert & oriented; pleasant & cooperative... HEENT:  [place]/AT, EOM-wnl, PERRLA, EACs-clear, TMs-wnl, NOSE-clear, THROAT-clear & wnl. NECK:  Supple w/ full ROM; no JVD; normal carotid impulses w/o bruits; no thyromegaly or nodules palpated; no lymphadenopathy. CHEST:  Clear to P & A; without wheezes/ rales/ or rhonchi. HEART:  Regular Rhythm; without murmurs/ rubs/ or gallops. ABDOMEN:  Soft & nontender; normal bowel sounds; no organomegaly or masses detected; +left fem bruit. EXT: without deformities, mild arthritic changes & decr ROM right shoulder; no varicose veins/ venous insuffic/ or edema. NEURO:  CN's intact;  no focal neuro deficits... DERM:  No lesions noted; no rash etc...  RADIOLOGY DATA:  Reviewed in the EPIC EMR & discussed w/ the patient...  LABORATORY DATA:  Reviewed in the EPIC EMR & discussed w/ the patient...   Assessment & Plan:   Borderline BP>  She stopped Norvasc on her own;  BP is reasonable at present 7 asked to monitor BP at home please...  HYPOTHYROIDISM>  She is clinically euthyroid on Synthroid 179mcg/d; TSH is now WNL, continue same...  DIVERTICS>  She is overdue for her screening colon, last done 5/01 by Emiliano Dyer;  We offered to set this up for her but she wants to sched her own appt & will call.  Overactive Bladder>  Dx by DrOttelin during recent eval,  her voiding symptoms are improvedon Myrbetiq...  OSTEOARTHRITIS>  She has some right shoulder pain & decr ROM (can't abduct);  She saw DrWainer & he Rx w/ shot + Medrol, etc,,,  OSTEOPENIA>  She is on Caltrate/ MVI/ Vit D & she is followed by her GYN;  Advised to have f/u BMD on sched so they can address the need for medication if nec...  ANXIETY>   On Lorazepam to 1mg  tabs- 1/2 to 1 tab  Tid Prn & she tells me she is using one qhs...   Patient's Medications  New Prescriptions   No medications on file  Previous Medications   ASPIRIN 81 MG TABLET    Take 81 mg by mouth daily.     CALCIUM CARBONATE-VITAMIN D (CALTRATE 600+D) 600-400 MG-UNIT PER TABLET    Take 1 tablet by mouth 2 (two) times daily.     CHOLECALCIFEROL (VITAMIN D) 1000 UNITS CAPSULE    Take 1,000 Units by mouth daily.     MIRABEGRON ER (MYRBETRIQ) 25 MG TB24    Take 1 tablet by mouth daily.  Modified Medications   Modified Medication Previous Medication   LORAZEPAM (ATIVAN) 1 MG TABLET LORazepam (ATIVAN) 1 MG tablet      Take 1 tablet (1 mg total) by mouth 3 (three) times daily as needed.    Take 1 tablet (1 mg total) by mouth 3 (three) times daily as needed.   SYNTHROID 100 MCG TABLET SYNTHROID 100 MCG tablet      Take 1 tablet (100 mcg total) by mouth daily. BRAND NAME ONLY- per pt request.    Take 1 tablet (100 mcg total) by mouth daily. BRAND NAME ONLY- per pt request.  Discontinued Medications   AMLODIPINE (NORVASC) 5 MG TABLET    Take 1 tablet (5 mg total) by mouth daily.

## 2011-05-31 NOTE — Patient Instructions (Signed)
Today we updated your med list in our EPIC system...    Continue your current medications the same...    We refilled your meds per request...  Today we did your follow up CXR & FASTING blood work...    We will call you w/ the results and mail a copy to you for your records...  Call for any questions...  Let's plan a follow up visit in 1 yr, sooner if needed of rproblems.Marland KitchenMarland Kitchen

## 2011-06-06 ENCOUNTER — Telehealth: Payer: Self-pay | Admitting: Pulmonary Disease

## 2011-06-06 NOTE — Telephone Encounter (Signed)
Per result note for 4.16.13 labs:  Notes Recorded by Marcellus Scott, CMA on 06/03/2011 at 2:18 PM lmomtcb to discuss lab results. Notes Recorded by Michele Mcalpine, MD on 06/01/2011 at 8:12 AM Pt notified by Leigh... SMN FLP looks good on diet alone... Chems w/ borderline FBS> rec low carb diet; otherw all wnl... CBC- wnl; Thyroid- ok on Synthroid100> continue same; VitD is good on OTC supplement. ----------------- Pt returned call, advised of lab results / recs as stated by SN above.  Pt verbalized her understanding and denied any questions.

## 2011-06-14 ENCOUNTER — Ambulatory Visit (AMBULATORY_SURGERY_CENTER): Payer: Medicare Other

## 2011-06-14 ENCOUNTER — Encounter: Payer: Self-pay | Admitting: Gastroenterology

## 2011-06-14 VITALS — Ht 67.0 in | Wt 156.0 lb

## 2011-06-14 DIAGNOSIS — Z1211 Encounter for screening for malignant neoplasm of colon: Secondary | ICD-10-CM

## 2011-06-14 MED ORDER — PEG-KCL-NACL-NASULF-NA ASC-C 100 G PO SOLR: 1.0000 | Freq: Once | ORAL | Status: AC

## 2011-06-28 ENCOUNTER — Ambulatory Visit (AMBULATORY_SURGERY_CENTER): Payer: Medicare Other | Admitting: Gastroenterology

## 2011-06-28 ENCOUNTER — Encounter: Payer: Self-pay | Admitting: Gastroenterology

## 2011-06-28 VITALS — BP 147/71 | HR 69 | Temp 95.9°F | Resp 21 | Ht 67.0 in | Wt 156.0 lb

## 2011-06-28 DIAGNOSIS — K5730 Diverticulosis of large intestine without perforation or abscess without bleeding: Secondary | ICD-10-CM

## 2011-06-28 DIAGNOSIS — Z1211 Encounter for screening for malignant neoplasm of colon: Secondary | ICD-10-CM

## 2011-06-28 MED ORDER — SODIUM CHLORIDE 0.9 % IV SOLN: 500.0000 mL | INTRAVENOUS | Status: DC

## 2011-06-28 NOTE — Progress Notes (Signed)
Difficulity advancing the scope.  The tech applied abdominal pressure to the pt which aided the scope advancement to reach the cecum. The pt tolerated the colonoscopy very well. Maw

## 2011-06-28 NOTE — Progress Notes (Signed)
Patient did not have preoperative order for IV antibiotic SSI prophylaxis. (G8918)  Patient did not experience any of the following events: a burn prior to discharge; a fall within the facility; wrong site/side/patient/procedure/implant event; or a hospital transfer or hospital admission upon discharge from the facility. ([id number])  

## 2011-06-28 NOTE — Op Note (Signed)
[facility] [address] [place], [place]  [address]  COLONOSCOPY PROCEDURE REPORT  PATIENT:  Ashley [name], Ashley [name]  MR#:  [id number] BIRTHDATE:  06-04-1940, 71 yrs. old  GENDER:  female ENDOSCOPIST:  Barbette Hair. Arlyce Dice, MD REF. BY: PROCEDURE DATE:  06/28/2011 PROCEDURE:  Diagnostic Colonoscopy ASA CLASS:  Class II INDICATIONS:  Routine Risk Screening MEDICATIONS:   MAC sedation, administered by CRNA propofol 150mg IV  DESCRIPTION OF PROCEDURE:   After the risks benefits and alternatives of the procedure were thoroughly explained, informed consent was obtained.  Digital rectal exam was performed and revealed no abnormalities.   The LB [id number]-[id number] endoscope was introduced through the anus and advanced to the cecum, which was identified by both the appendix and ileocecal valve, without limitations.  The quality of the prep was excellent, using MoviPrep.  The instrument was then slowly withdrawn as the colon was fully examined. <<PROCEDUREIMAGES>>  FINDINGS:  Scattered diverticula were found in the sigmoid colon. This was otherwise a normal examination of the colon (see image1, image2, and image3).   Retroflexed views in the rectum revealed no abnormalities.    The time to cecum =  1) 5.75  minutes. The scope was then withdrawn in  1) 6.75  minutes from the cecum and the procedure completed. COMPLICATIONS:  None ENDOSCOPIC IMPRESSION: 1) Diverticula, scattered in the sigmoid colon 2) Otherwise normal examination RECOMMENDATIONS: 1) Continue current colorectal screening recommendations for "routine risk" patients with a repeat colonoscopy in 10 years. REPEAT EXAM:  In 10 year(s) for Colonoscopy.  ______________________________ Barbette Hair. Arlyce Dice, MD  CC:  Michele Mcalpine, MD  n. Rosalie Doctor:   Barbette Hair. Shuronda Santino at 06/28/2011 10:11 AM  [place], Bonita Quin, [id number]

## 2011-06-28 NOTE — Patient Instructions (Signed)
Impressions/recommendations:  Diverticulosis (handout given) High Fiber Diet (handout given)  Repeat colonoscopy in 10 years.  YOU HAD AN ENDOSCOPIC PROCEDURE TODAY AT THE [facility]: Refer to the procedure report that was given to you for any specific questions about what was found during the examination.  If the procedure report does not answer your questions, please call your gastroenterologist to clarify.  If you requested that your care partner not be given the details of your procedure findings, then the procedure report has been included in a sealed envelope for you to review at your convenience later.  YOU SHOULD EXPECT: Some feelings of bloating in the abdomen. Passage of more gas than usual.  Walking can help get rid of the air that was put into your GI tract during the procedure and reduce the bloating. If you had a lower endoscopy (such as a colonoscopy or flexible sigmoidoscopy) you may notice spotting of blood in your stool or on the toilet paper. If you underwent a bowel prep for your procedure, then you may not have a normal bowel movement for a few days.  DIET: Your first meal following the procedure should be a light meal and then it is ok to progress to your normal diet.  A half-sandwich or bowl of soup is an example of a good first meal.  Heavy or fried foods are harder to digest and may make you feel nauseous or bloated.  Likewise meals heavy in dairy and vegetables can cause extra gas to form and this can also increase the bloating.  Drink plenty of fluids but you should avoid alcoholic beverages for 24 hours.  ACTIVITY: Your care partner should take you home directly after the procedure.  You should plan to take it easy, moving slowly for the rest of the day.  You can resume normal activity the day after the procedure however you should NOT DRIVE or use heavy machinery for 24 hours (because of the sedation medicines used during the test).    SYMPTOMS TO REPORT  IMMEDIATELY: A gastroenterologist can be reached at any hour.  During normal business hours, 8:30 AM to 5:00 PM Monday through Friday, call [phone].  After hours and on weekends, please call the GI answering service at [phone] who will take a message and have the physician on call contact you.   Following lower endoscopy (colonoscopy or flexible sigmoidoscopy):  Excessive amounts of blood in the stool  Significant tenderness or worsening of abdominal pains  Swelling of the abdomen that is new, acute  Fever of 100F or higher  Following upper endoscopy (EGD)  Vomiting of blood or coffee ground material  New chest pain or pain under the shoulder blades  Painful or persistently difficult swallowing  New shortness of breath  Fever of 100F or higher  Black, tarry-looking stools  FOLLOW UP: If any biopsies were taken you will be contacted by phone or by letter within the next 1-3 weeks.  Call your gastroenterologist if you have not heard about the biopsies in 3 weeks.  Our staff will call the home number listed on your records the next business day following your procedure to check on you and address any questions or concerns that you may have at that time regarding the information given to you following your procedure. This is a courtesy call and so if there is no answer at the home number and we have not heard from you through the emergency physician on call, we will assume   that you have returned to your regular daily activities without incident.  SIGNATURES/CONFIDENTIALITY: You and/or your care partner have signed paperwork which will be entered into your electronic medical record.  These signatures attest to the fact that that the information above on your After Visit Summary has been reviewed and is understood.  Full responsibility of the confidentiality of this discharge information lies with you and/or your care-partner.  

## 2011-06-29 ENCOUNTER — Telehealth: Payer: Self-pay

## 2011-06-29 NOTE — Telephone Encounter (Signed)
  Follow up Call-  Call back number 06/28/2011  Post procedure Call Back phone  # [phone]  Permission to leave phone message Yes     Patient questions:  Do you have a fever, pain , or abdominal swelling? no Pain Score  0 *  Have you tolerated food without any problems? yes  Have you been able to return to your normal activities? yes  Do you have any questions about your discharge instructions: Diet   no Medications  no Follow up visit  no  Do you have questions or concerns about your Care? no  Actions: * If pain score is 4 or above: No action needed, pain <4.

## 2011-08-31 ENCOUNTER — Other Ambulatory Visit: Payer: Self-pay | Admitting: *Deleted

## 2011-08-31 MED ORDER — SYNTHROID 100 MCG PO TABS: 100.0000 ug | ORAL_TABLET | Freq: Every day | ORAL | Status: DC

## 2011-11-27 ENCOUNTER — Other Ambulatory Visit: Payer: Self-pay | Admitting: Pulmonary Disease

## 2012-01-17 ENCOUNTER — Telehealth: Payer: Self-pay | Admitting: Pulmonary Disease

## 2012-01-17 MED ORDER — LORAZEPAM 1 MG PO TABS: ORAL_TABLET | ORAL | Status: DC

## 2012-01-17 NOTE — Telephone Encounter (Signed)
SN---pt is requesting that her lorazapam 1 mg be sent in to the [facility] for a refill.  Please advise on the directions of this medication and quantity to give the pt.  thanks

## 2012-01-17 NOTE — Telephone Encounter (Signed)
rx has been printed for the pt and will be faxed to [facility] per pts request. Nothing further is needed.

## 2012-03-01 ENCOUNTER — Telehealth: Payer: Self-pay | Admitting: Pulmonary Disease

## 2012-03-01 NOTE — Telephone Encounter (Signed)
[email or web address].Jennifer Castillo, CMA  

## 2012-03-02 MED ORDER — SYNTHROID 100 MCG PO TABS: ORAL_TABLET | ORAL | Status: DC

## 2012-03-02 NOTE — Telephone Encounter (Signed)
Called, spoke with pt.  Pt states she has been on synthroid brand name for 30 yrs so doesn't want to change rxs but this level is going up.  Pt states it will be over $100 through [facility] but could be cheaper through [facility].  Pt states she was told by [facility] that if she didn't want to change rxs she could get our office to call them and request she be left on a level 3 so the cost doesn't go up for her.  Advised I would call [facility] and call her back.  She verbalized understanding.  Called [facility], spoke with Nidhi, Pharmacist, was advised she could not do anything on the levels and had no idea what I was talking about.  Was advised she could only take a VO for the medication synthriod.  Gave VO for this and was advised by [facility] that she was going to place this medication on hold with the comment about in it and pt would have to call to release the order.  Per Nidhi, this medication would be $80.88 for a 3 month supply.    Called, spoke with pt again.  Informed her of above per Nidhi.  Pt states she called [phone] when she was advised of above.    I called # provided by pt, spoke with Renita with [facility].  States there is a Tier exception form that needs to be filled out.  States she will locate this form and fax to triage.  She will also call back later today to make sure we received the fax.    Called, spoke with pt.  Informed her of above per Renita and advised we would call her back once this has been taken care of.  She verbalized understanding and voiced no further questions or concern at this time.  Will route msg to triage to await fax.

## 2012-03-06 NOTE — Telephone Encounter (Signed)
Leigh, I do not see the tier exception form in triage. I have looked on the fax and in the forms. Have you seen this come across yet?

## 2012-03-14 ENCOUNTER — Telehealth: Payer: Self-pay | Admitting: Pulmonary Disease

## 2012-03-14 NOTE — Telephone Encounter (Signed)
I spoke with pt. She stated her insurance is going to fax form over for her synthroid medication. She stated as soon as we receive this she needs this faxed back over. Will forward to leigh to look out for fax

## 2012-03-14 NOTE — Telephone Encounter (Signed)
i have called and spoke with [facility] pharmacy---they stated that this medication price has been increased by the manufacturer and the insurance company and the pharmacy are not able to change the price of the medication.  If she feels that her insurance company will write a letter to Dr. Kriste Basque stating that if he writes a letter for her that they will be able to give her this medication cheaper, then she will need to have this letter sent to Dr. Kriste Basque from her insurance company.  thanks

## 2012-03-14 NOTE — Telephone Encounter (Signed)
The pt was made aware of the below and will contact her insurance regarding this matter. Pt verbalized understanding that there may be nothing else to be done to lower the price of the Synthroid.

## 2012-03-15 NOTE — Telephone Encounter (Signed)
i have not seen any form come through for her.  thanks

## 2012-03-15 NOTE — Telephone Encounter (Signed)
Leigh, please advise if fax has been received.

## 2012-03-15 NOTE — Telephone Encounter (Signed)
I called [employer] [phone] and they will have form re-sent to [phone]. Will forward to leigh so she is aware

## 2012-03-15 NOTE — Telephone Encounter (Signed)
Pt aware form has been received from [facility] and given to Marliss Czar, SN nurse. She will have SN review form or complete and fax this back to [facility], We will call her once we get a response from her insurance.

## 2012-03-15 NOTE — Telephone Encounter (Signed)
Pt called back again.  She can be reached @ [phone]. Ashley [name]

## 2012-03-15 NOTE — Telephone Encounter (Signed)
Alatiesha with [employer] called re: form that was faxed twice on 03-14-12. She needs dx and "list of meds that have been tried as regards to SYNTHROID.  Ph# is [phone]. Hazel Sams

## 2012-03-16 ENCOUNTER — Telehealth: Payer: Self-pay | Admitting: Pulmonary Disease

## 2012-03-16 NOTE — Telephone Encounter (Signed)
I will forward this message to Leigh.

## 2012-03-16 NOTE — Telephone Encounter (Signed)
See other phone note.  This form has been received from [employer] and denied since the pt has not tried the generic form of the medication.

## 2012-03-16 NOTE — Telephone Encounter (Signed)
i called and spoke with pt and she is aware and she stated that they did call her to and said that they have not closed this out yet.  The pt stated that she felt that they would not approve it.  Nothing further is needed.

## 2012-04-23 ENCOUNTER — Telehealth: Payer: Self-pay | Admitting: Pulmonary Disease

## 2012-04-23 NOTE — Telephone Encounter (Signed)
I spoke with pt and she stated she is already taking this once. Please advise SN thanks

## 2012-04-23 NOTE — Telephone Encounter (Signed)
Per SN----  Restart the amlodipine  5 mg  1 daily and will need an rov in 1 month with SN.  thanks

## 2012-04-23 NOTE — Telephone Encounter (Signed)
Patient states she was given BP (amlodipine 5mg  1 tab daily)  in 2012. States she used it briefly but has not needed in in a while. Patient states she has been going through a diff time last few weeks Has noticed BP has been increasing.  Systolic 170s-180s and diastolic has been 70s-80s. States the AVS says to call MD if BP is 150s consistently Also states she has been having a slight "tightness" across forehead but not quite an actual headache Patient would like recs on this matter.  Dr. Kriste Basque please advise, thank you.

## 2012-04-23 NOTE — Telephone Encounter (Signed)
Called spoke with patient for clarification per SN's request Patient stated that she began taking the amlodipine 5mg  4 days ago on 3.6.14 Has been taking regularly Taking her BP twice daily > in the AM before taking her medication and in the PM AM readings are still elevated after restarting the amlodipine, though lower still than prior Advised patient to continue taking the amlodipine EVERY DAY and REGULARLY Advised will take a few weeks for her BP to level out and the med to be in full effect Advised pt if her BPs are consistently over >160 with the medication, she will need to call the office Pt verbalized her understanding and denied any further questions Will sign off

## 2012-05-25 ENCOUNTER — Other Ambulatory Visit: Payer: Self-pay | Admitting: Pulmonary Disease

## 2012-06-15 ENCOUNTER — Telehealth: Payer: Self-pay | Admitting: Pulmonary Disease

## 2012-06-15 ENCOUNTER — Telehealth: Payer: Self-pay | Admitting: Internal Medicine

## 2012-06-15 MED ORDER — SYNTHROID 100 MCG PO TABS: ORAL_TABLET | ORAL | Status: DC

## 2012-06-15 NOTE — Telephone Encounter (Signed)
I spoke with pt and she wanted her synthroid RX sent to [facility] in [place]. She stated she knows her insurance isn't going to pay for it but it is cheaper there cash price.Marland Kitchen i have sent RX. Nothing further was needed

## 2012-06-15 NOTE — Telephone Encounter (Signed)
Error.  Wrong doc.  Holly D Pryor ° °

## 2012-06-15 NOTE — Telephone Encounter (Signed)
Called and spoke with Dr. Pollyann Kennedy and he stated that the appeal for the synthroid for this pt had come to him--he was trying to make the decision today for this and stated that on the papers sent in this was requested by the pt.  He is aware that the pt has been on this dose for years and did not want to change to another "brand" of synthroid.  Dr. Pollyann Kennedy did not need anything further  And they will fax over the denial or approval. Nothing further is needed.

## 2012-08-15 ENCOUNTER — Ambulatory Visit (INDEPENDENT_AMBULATORY_CARE_PROVIDER_SITE_OTHER): Payer: Medicare Other | Admitting: Pulmonary Disease

## 2012-08-15 ENCOUNTER — Ambulatory Visit: Payer: Medicare Other | Admitting: Pulmonary Disease

## 2012-08-15 ENCOUNTER — Encounter: Payer: Self-pay | Admitting: Pulmonary Disease

## 2012-08-15 VITALS — BP 124/82 | HR 64 | Temp 97.5°F | Ht 67.0 in | Wt 154.6 lb

## 2012-08-15 DIAGNOSIS — E039 Hypothyroidism, unspecified: Secondary | ICD-10-CM

## 2012-08-15 DIAGNOSIS — R030 Elevated blood-pressure reading, without diagnosis of hypertension: Secondary | ICD-10-CM

## 2012-08-15 DIAGNOSIS — Z85828 Personal history of other malignant neoplasm of skin: Secondary | ICD-10-CM

## 2012-08-15 DIAGNOSIS — E7800 Pure hypercholesterolemia, unspecified: Secondary | ICD-10-CM

## 2012-08-15 DIAGNOSIS — M810 Age-related osteoporosis without current pathological fracture: Secondary | ICD-10-CM

## 2012-08-15 DIAGNOSIS — K5730 Diverticulosis of large intestine without perforation or abscess without bleeding: Secondary | ICD-10-CM

## 2012-08-15 DIAGNOSIS — F411 Generalized anxiety disorder: Secondary | ICD-10-CM

## 2012-08-15 MED ORDER — AMLODIPINE BESYLATE 5 MG PO TABS: 5.0000 mg | ORAL_TABLET | Freq: Every day | ORAL | Status: DC

## 2012-08-15 MED ORDER — LORAZEPAM 1 MG PO TABS: ORAL_TABLET | ORAL | Status: DC

## 2012-08-15 NOTE — Patient Instructions (Addendum)
Today we updated your med list in our EPIC system...    Continue your current medications the same...  We refilled the meds you requested....  Please return to our lab in the AM for your FASTING blood work...    We will contact you w/ the results when available...   Call for any questions...  Let's plan a follow up visit in 5yr, sooner if needed for problems.Marland KitchenMarland Kitchen

## 2012-08-15 NOTE — Progress Notes (Signed)
Subjective:    Patient ID: Ashley [name], female    DOB: 1940/04/16, 72 y.o.   MRN: [id number]  HPI 72 y/o WF here for a follow up visit...  She has mult medical problems including:  Hypothyroidism;  Divertics/ Hems;  DJD/ Osteopenia;  Anxiety...  ~  May 27, 2010:  2 yr ROV and she has been under a lot of stress w/ daughter's divorce "it's taking a toll on me";  She was having weak spells & BP sl labile but has settled at 140s/ 80s at home checks;  She notes that for the weak spells she will go to sleep for relief & wakes feeling better, also eating more freq smaller meals helps "even eating celery helps";  She requests increase in her Ativan Rx, fasting blood work, and Synthroid refill today...  Also c/o some right shoulder discomfort & inability to fully abduct right arm- [facility]- we discussed Ortho eval (she will call DrWainer when she is ready for this)...  ~  May 31, 2011:  Yearly ROV & Aldea reports that she was Dx w/ overactive bladder by DrOttelin & started on new medication MyrbetriqSR 25mg /d- and it seems to be helping;  She wants me to know that she stopped her Norvasc on her own because she didn't think she needed it (stress level is decr & home BP checks all good in the 120-130 range) & claims it caused constipation;  She denies CP, palpit, dizzy, SOB, edema, etc...  See prob list below>>  We reviewed medical issues, meds, XRays, & lab work... CXR 4/13 showed normal heart size, clear lungs, WNL.Marland KitchenMarland Kitchen LABS 4/13:  FLP- ok on diet;  Chems- ok w/ BS107;  CBC- wnl;  TSH=1.33, VitD=67    ~  August 15, 2012:  35mo ROV & Larae is doing well- no new complaints or concerns just incr stress w/ husb illness 7 rec to take the Lorazepam more regularly;  We reviewed the following medical problems during today's office visit >>     Hx HBP, AtypCP> on Amlod5, ASA81; BP= 124/82 & she denies CP, palpit, dizzy, SOB, edema...    Hypothyroid> on Levothy100; she is clinically & biochemically  euthyroid; labs 7/14 showed TSH= 0.94    GI- Divertics, Hems> followed by DrKaplan & last colon was 5/13 by DrKaplan- scat divertics only...    [facility]- OB> prev eval by DrOttelin & symptoms improved on Myrbetriq; she has since stopped the med 7 decr fluid intake w/ same result & denies "gotta go" symptoms...    DJD> followed by DrWainer on OTC analgesics as needed...    Osteopenia> on Calcium, Mag, MVI, VitD1000; BMDs per Gyn & she tells me she is stable & doing ok on her supplements; "I feel stronger & I exercise"...    Anxiety> on Lorazepam 1mg  prn...    Hx skin cancer> aware... We reviewed prob list, meds, xrays and labs> see below for updates >>  LABS 7/14:  FLP- at goals on diet alone;  Chems- ok x BS=110;  CBC- wnl;  TSH=0.94 on Levothy100...          Problem List:     HYPERTENSION, Borderline >> prev on Amlodipine 5mg /d- pt stopped on her own. Hx of CHEST PAIN, ATYPICAL (ICD-786.59) - on ASA 81mg /d... ~  NuclearStressTest 3/02 was normal w/o scar or ischemia, EF=81%. ~  ABI's 12/07 were normal (c/o pain & numbness in legs). ~  CXR 4/13 showed normal heart size, clear lungs,  wnl... ~  4/13:  She stopped Amlod5 on her own- denies CP, palpit, dyspnea, syncope, edema, etc... ~  7/14: on Amlod5, ASA81; BP= 124/82 & she denies CP, palpit, dizzy, SOB, edema  HYPOTHYROIDISM (ICD-244.9) - currently on SYNTHROID 166mcg/d she doesn't want generic...  ~  on Synthroid Rx x yrs w/ TSH ~14 in 1983 & med started by DrESL. ~  Thyroid Ultrasound 3/10 showed = sl goiter, no nodules seen. ~  Labs & Synthroid dose have been adjusted slightly over the yrs... ~  Labs 4/12 on Synth112 showed TSH= 0.30... rec sl decr to 172mcg/d. ~  Labs 4/13 on Synth100 showed TSH= 1.33... Continue same. 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 f/u planned 46yrs. ~  f/u colonoscopy 5/13 by DrKaplan showed scat divertics, otherw neg & f/u planned in 10 yrs...  DEGENERATIVE ARTHRITIS (ICD-715.90) - she uses OTC anti-inflamm meds Prn. ~  4/12: c/o right shoulder pain & decr ROM, advised to get eval by Ortho==>DrWainer, Dx rotator cuff tendonitis vs part tear; given shot, PT, Mobic15, & consider MRI. ~  4/13:  She reports improved, no problem...  OSTEOPOROSIS (ICD-733.00) - prev Actonel was stopped 9/09; taking Ca++/ Vits/ Vit D... ~  BMD 12/07 showed TScores -0.8 (hips) to -2.0 (spine)... sl improved from 2005 and before. ~  pt states VitD level was OK per DrMcPhail... ~  repeat BMD 12/09 by [facility] showed TScores -1.3 R FemNeck & -2.0 in Spine... she has stopped the Bisphos therapy due to dental problems and feels she is improved off this med... ~  4/12:  She tells me that GYN continues to follow her BMDs & manages this prob for her; she refuses all bisphos therapies...  FATIGUE (ICD-780.79)  ANXIETY (ICD-300.00) - on LORAZEPAM 1mg  as needed... usually takes 1 Qhs... ~  3/10:  she relates a story where her brother developed DM and later died from pancreatic cancer... she likes to get copies of all her lab work and noted her BS was 116 & she thinks that she is diabetic and may also have pancreatic cancer causing this... we discussed this in detail & I tried to reassure her--- we will check BS=109/ A1c=5.8/ Abd Ultrasound= WNL.Marland Kitchen. ~  4/12:  Under a lot of stress w/ daughter's divorce> she wants to incr the LORAZEPAM to 1mg  tabs- 1/2 to 1 tab Tid Prn... ~  4/13:  She notes that stress is diminished but still taking ativan 1mg  Qhs for sleep...  SKIN CANCER, HX OF (ICD-V10.83) - DrHall removed a squamous cell lesion from chest wall... SHINGLES - she had a bout of shingles Oct08 on her left hip area, treated at the ER w/ cortisone shot and Famvir (she refused Pred tablets due to her osteopenia)...  HEALTH MAINTENANCE -  ~   FLP 4/13 showed TChol 201, TG 140, HDL 79, LDL 97... on diet alone. ~  GYN= [facility] now & she saw Debbora Dus for PAP etc... ~  Urology, DrOttelin for overactive bladder eval & Rx w/ Myrbetriq... ~  Immuniz:  She gets the yearly seasonal Flu vaccines...  Past Surgical History  Procedure Laterality Date  . Colonoscopy      Outpatient Encounter Prescriptions as of 08/15/2012  Medication Sig Dispense Refill  . amLODipine (NORVASC) 5 MG tablet Take 5 mg by mouth daily.      Marland Kitchen  aspirin 81 MG tablet Take 81 mg by mouth daily.        . Calcium Carbonate-Vitamin D (CALTRATE 600+D) 600-400 MG-UNIT per tablet Take 1 tablet by mouth 2 (two) times daily.        . Cholecalciferol (VITAMIN D) 1000 UNITS capsule Take 1,000 Units by mouth daily.        . fish oil-omega-3 fatty acids 1000 MG capsule Take 1 g by mouth daily.      Marland Kitchen LORazepam (ATIVAN) 1 MG tablet Take 1/2 to 1 tablet by mouth three times daily as needed  90 tablet  1  . Magnesium Oxide (MAG-200 PO) Take by mouth 3 (three) times a week.      . Multiple Vitamin (MULTIVITAMIN) tablet Take 1 tablet by mouth daily. Take one Women's MVI Daily      . SYNTHROID 100 MCG tablet TAKE ONE TABLET BY MOUTH ONE TIME DAILY  90 tablet  1  . [DISCONTINUED] Mirabegron ER (MYRBETRIQ) 25 MG TB24 Take 1 tablet by mouth daily.       Facility-Administered Encounter Medications as of 08/15/2012  Medication Dose Route Frequency Provider Last Rate Last Dose  . 0.9 %  sodium chloride infusion  500 mL Intravenous Continuous Louis Meckel, MD        Allergies  Allergen Reactions  . Alendronate Sodium     REACTION: pt states "blisters in my mouth"  . Latex     sensitive  . Penicillins     REACTION: fatigue/malaise, "feels extremely bad"  . Raloxifene     REACTION: pt states "it dried me out"  . Risedronate Sodium     REACTION: pt states "teeth problems"    Review of Systems        See HPI - all other systems neg except as noted...       The patient  c/o anxiety, weak spells, & pain in the right shoulder;  She denies anorexia, fever, weight loss, weight gain, vision loss, decreased hearing, hoarseness, chest pain, syncope, dyspnea on exertion, peripheral edema, prolonged cough, headaches, hemoptysis, abdominal pain, melena, hematochezia, severe indigestion/heartburn, hematuria, incontinence, suspicious skin lesions, transient blindness, difficulty walking, depression, unusual weight change, abnormal bleeding, enlarged lymph nodes, and angioedema.     Objective:   Physical Exam      WD, WN, 72 y/o WF in NAD... GENERAL:  Alert & oriented; pleasant & cooperative... HEENT:  [place]/AT, EOM-wnl, PERRLA, EACs-clear, TMs-wnl, NOSE-clear, THROAT-clear & wnl. NECK:  Supple w/ full ROM; no JVD; normal carotid impulses w/o bruits; no thyromegaly or nodules palpated; no lymphadenopathy. CHEST:  Clear to P & A; without wheezes/ rales/ or rhonchi. HEART:  Regular Rhythm; without murmurs/ rubs/ or gallops. ABDOMEN:  Soft & nontender; normal bowel sounds; no organomegaly or masses detected; +left fem bruit. EXT: without deformities, mild arthritic changes & decr ROM right shoulder; no varicose veins/ venous insuffic/ or edema. NEURO:  CN's intact;  no focal neuro deficits... DERM:  No lesions noted; no rash etc...  RADIOLOGY DATA:  Reviewed in the EPIC EMR & discussed w/ the patient...  LABORATORY DATA:  Reviewed in the EPIC EMR & discussed w/ the patient...   Assessment & Plan:   Borderline BP>  She is back on her Amlod5? BP remains well controlled...  HYPOTHYROIDISM>  She is clinically euthyroid on Synthroid 133mcg/d; TSH is now WNL, continue same...  DIVERTICS>  Follow up colon done 5/13 & showed just scat divertics...  Overactive Bladder>  Dx by DrOttelin  during recent eval, her voiding symptoms are improved on Myrbetiq=> stopped on her own 7 doing satis w. decr fluids she says...  OSTEOARTHRITIS>  She has some right shoulder pain & decr ROM  (can't abduct);  She saw DrWainer & he Rx w/ shot + Medrol, etc,,,  OSTEOPENIA>  She is on Caltrate/ MVI/ Vit D & she is followed by her GYN;  Advised to have f/u BMD on sched so they can address the need for medication if nec...  ANXIETY>   On Lorazepam to 1mg  tabs- 1/2 to 1 tab  Tid Prn & she tells me she is using one qhs...   Patient's Medications  New Prescriptions   No medications on file  Previous Medications   ASPIRIN 81 MG TABLET    Take 81 mg by mouth daily.     CALCIUM CARBONATE-VITAMIN D (CALTRATE 600+D) 600-400 MG-UNIT PER TABLET    Take 1 tablet by mouth 2 (two) times daily.     CHOLECALCIFEROL (VITAMIN D) 1000 UNITS CAPSULE    Take 1,000 Units by mouth daily.     FISH OIL-OMEGA-3 FATTY ACIDS 1000 MG CAPSULE    Take 1 g by mouth daily.   MAGNESIUM OXIDE (MAG-200 PO)    Take by mouth 3 (three) times a week.   MULTIPLE VITAMIN (MULTIVITAMIN) TABLET    Take 1 tablet by mouth daily. Take one Women's MVI Daily   SYNTHROID 100 MCG TABLET    TAKE ONE TABLET BY MOUTH ONE TIME DAILY  Modified Medications   Modified Medication Previous Medication   AMLODIPINE (NORVASC) 5 MG TABLET amLODipine (NORVASC) 5 MG tablet      Take 1 tablet (5 mg total) by mouth daily.    Take 5 mg by mouth daily.   LORAZEPAM (ATIVAN) 1 MG TABLET LORazepam (ATIVAN) 1 MG tablet      Take 1/2 to 1 tablet by mouth three times daily as needed    Take 1/2 to 1 tablet by mouth three times daily as needed  Discontinued Medications   MIRABEGRON ER (MYRBETRIQ) 25 MG TB24    Take 1 tablet by mouth daily.

## 2012-08-16 ENCOUNTER — Other Ambulatory Visit (INDEPENDENT_AMBULATORY_CARE_PROVIDER_SITE_OTHER): Payer: Medicare Other

## 2012-08-16 DIAGNOSIS — K5730 Diverticulosis of large intestine without perforation or abscess without bleeding: Secondary | ICD-10-CM

## 2012-08-16 DIAGNOSIS — R030 Elevated blood-pressure reading, without diagnosis of hypertension: Secondary | ICD-10-CM

## 2012-08-16 DIAGNOSIS — F411 Generalized anxiety disorder: Secondary | ICD-10-CM

## 2012-08-16 DIAGNOSIS — E7800 Pure hypercholesterolemia, unspecified: Secondary | ICD-10-CM

## 2012-08-16 LAB — HEPATIC FUNCTION PANEL
ALT: 14 U/L (ref 0–35)
AST: 16 U/L (ref 0–37)
Bilirubin, Direct: 0.1 mg/dL (ref 0.0–0.3)
Total Protein: 6.9 g/dL (ref 6.0–8.3)

## 2012-08-16 LAB — CBC WITH DIFFERENTIAL/PLATELET
Basophils Absolute: 0 10*3/uL (ref 0.0–0.1)
Basophils Relative: 0.7 % (ref 0.0–3.0)
Eosinophils Absolute: 0.1 10*3/uL (ref 0.0–0.7)
Lymphocytes Relative: 35.6 % (ref 12.0–46.0)
MCHC: 33.2 g/dL (ref 30.0–36.0)
MCV: 94.7 fl (ref 78.0–100.0)
Monocytes Absolute: 0.4 10*3/uL (ref 0.1–1.0)
Neutrophils Relative %: 52.8 % (ref 43.0–77.0)
Platelets: 197 10*3/uL (ref 150.0–400.0)
RBC: 4.24 Mil/uL (ref 3.87–5.11)
RDW: 13 % (ref 11.5–14.6)

## 2012-08-16 LAB — BASIC METABOLIC PANEL
BUN: 15 mg/dL (ref 6–23)
CO2: 34 mEq/L — ABNORMAL HIGH (ref 19–32)
Chloride: 105 mEq/L (ref 96–112)
Creatinine, Ser: 0.8 mg/dL (ref 0.4–1.2)
Potassium: 4.3 mEq/L (ref 3.5–5.1)

## 2012-08-16 LAB — TSH: TSH: 0.94 u[IU]/mL (ref 0.35–5.50)

## 2012-08-16 LAB — LIPID PANEL
Cholesterol: 185 mg/dL (ref 0–200)
Total CHOL/HDL Ratio: 2
Triglycerides: 72 mg/dL (ref 0.0–149.0)

## 2012-09-07 ENCOUNTER — Ambulatory Visit: Payer: Medicare Other | Admitting: Pulmonary Disease

## 2012-09-10 ENCOUNTER — Other Ambulatory Visit: Payer: Self-pay | Admitting: Pulmonary Disease

## 2012-12-06 ENCOUNTER — Telehealth: Payer: Self-pay | Admitting: Pulmonary Disease

## 2012-12-06 ENCOUNTER — Other Ambulatory Visit: Payer: Self-pay | Admitting: Pulmonary Disease

## 2012-12-06 MED ORDER — SYNTHROID 100 MCG PO TABS: ORAL_TABLET | ORAL | Status: DC

## 2012-12-06 NOTE — Telephone Encounter (Signed)
RX has been sent for pt. Pt is aware. Nothing further needed

## 2013-03-05 ENCOUNTER — Telehealth: Payer: Self-pay | Admitting: Pulmonary Disease

## 2013-03-05 MED ORDER — AMLODIPINE BESYLATE 5 MG PO TABS: ORAL_TABLET | ORAL | Status: DC

## 2013-03-05 MED ORDER — LORAZEPAM 1 MG PO TABS: ORAL_TABLET | ORAL | Status: DC

## 2013-03-05 MED ORDER — SYNTHROID 100 MCG PO TABS: ORAL_TABLET | ORAL | Status: DC

## 2013-03-05 NOTE — Telephone Encounter (Signed)
rx have been signed and placed in the mail to the pt

## 2013-03-05 NOTE — Telephone Encounter (Signed)
Spoke with pt. Confirmed RX's needed. She is aware will mail once signed. RX printed and placed on SN cart for signature. Nothing further needed

## 2013-06-05 ENCOUNTER — Other Ambulatory Visit (HOSPITAL_COMMUNITY): Payer: Self-pay | Admitting: Obstetrics & Gynecology

## 2013-06-05 DIAGNOSIS — Z1231 Encounter for screening mammogram for malignant neoplasm of breast: Secondary | ICD-10-CM

## 2013-06-06 ENCOUNTER — Ambulatory Visit (HOSPITAL_COMMUNITY): Payer: Medicare Other

## 2013-06-11 ENCOUNTER — Ambulatory Visit (HOSPITAL_COMMUNITY)
Admission: RE | Admit: 2013-06-11 | Discharge: 2013-06-11 | Disposition: A | Discharge status: Home / Self Care | Payer: 59 | Source: Ambulatory Visit | Attending: Obstetrics & Gynecology | Admitting: Obstetrics & Gynecology

## 2013-06-11 ENCOUNTER — Other Ambulatory Visit: Payer: Self-pay | Admitting: Pulmonary Disease

## 2013-06-11 ENCOUNTER — Encounter (INDEPENDENT_AMBULATORY_CARE_PROVIDER_SITE_OTHER): Payer: Self-pay

## 2013-06-11 DIAGNOSIS — Z1231 Encounter for screening mammogram for malignant neoplasm of breast: Secondary | ICD-10-CM

## 2013-08-19 ENCOUNTER — Encounter: Payer: Self-pay | Admitting: Pulmonary Disease

## 2013-08-19 ENCOUNTER — Ambulatory Visit (INDEPENDENT_AMBULATORY_CARE_PROVIDER_SITE_OTHER): Payer: Medicare Other | Admitting: Pulmonary Disease

## 2013-08-19 ENCOUNTER — Ambulatory Visit (INDEPENDENT_AMBULATORY_CARE_PROVIDER_SITE_OTHER)
Admission: RE | Admit: 2013-08-19 | Discharge: 2013-08-19 | Disposition: A | Discharge status: Home / Self Care | Payer: Medicare Other | Source: Ambulatory Visit | Attending: Pulmonary Disease | Admitting: Pulmonary Disease

## 2013-08-19 ENCOUNTER — Other Ambulatory Visit (INDEPENDENT_AMBULATORY_CARE_PROVIDER_SITE_OTHER): Payer: Medicare Other

## 2013-08-19 VITALS — BP 120/78 | HR 67 | Temp 97.5°F | Ht 67.0 in | Wt 145.0 lb

## 2013-08-19 DIAGNOSIS — R030 Elevated blood-pressure reading, without diagnosis of hypertension: Secondary | ICD-10-CM

## 2013-08-19 DIAGNOSIS — E7800 Pure hypercholesterolemia, unspecified: Secondary | ICD-10-CM

## 2013-08-19 DIAGNOSIS — E039 Hypothyroidism, unspecified: Secondary | ICD-10-CM

## 2013-08-19 DIAGNOSIS — M899 Disorder of bone, unspecified: Secondary | ICD-10-CM

## 2013-08-19 DIAGNOSIS — K5730 Diverticulosis of large intestine without perforation or abscess without bleeding: Secondary | ICD-10-CM

## 2013-08-19 DIAGNOSIS — E559 Vitamin D deficiency, unspecified: Secondary | ICD-10-CM

## 2013-08-19 DIAGNOSIS — F411 Generalized anxiety disorder: Secondary | ICD-10-CM

## 2013-08-19 DIAGNOSIS — M8580 Other specified disorders of bone density and structure, unspecified site: Secondary | ICD-10-CM

## 2013-08-19 DIAGNOSIS — Z85828 Personal history of other malignant neoplasm of skin: Secondary | ICD-10-CM

## 2013-08-19 DIAGNOSIS — M949 Disorder of cartilage, unspecified: Secondary | ICD-10-CM

## 2013-08-19 LAB — CBC WITH DIFFERENTIAL/PLATELET
Basophils Absolute: 0 10*3/uL (ref 0.0–0.1)
Basophils Relative: 0.9 % (ref 0.0–3.0)
EOS PCT: 1.1 % (ref 0.0–5.0)
Eosinophils Absolute: 0 10*3/uL (ref 0.0–0.7)
HCT: 40.8 % (ref 36.0–46.0)
Hemoglobin: 13.9 g/dL (ref 12.0–15.0)
LYMPHS PCT: 39.5 % (ref 12.0–46.0)
Lymphs Abs: 1.5 10*3/uL (ref 0.7–4.0)
MCHC: 33.9 g/dL (ref 30.0–36.0)
MCV: 92 fl (ref 78.0–100.0)
Monocytes Absolute: 0.3 10*3/uL (ref 0.1–1.0)
Monocytes Relative: 7.9 % (ref 3.0–12.0)
NEUTROS ABS: 1.9 10*3/uL (ref 1.4–7.7)
NEUTROS PCT: 50.6 % (ref 43.0–77.0)
Platelets: 193 10*3/uL (ref 150.0–400.0)
RBC: 4.43 Mil/uL (ref 3.87–5.11)
RDW: 12.9 % (ref 11.5–15.5)
WBC: 3.8 10*3/uL — AB (ref 4.0–10.5)

## 2013-08-19 LAB — HEPATIC FUNCTION PANEL
ALT: 19 U/L (ref 0–35)
AST: 19 U/L (ref 0–37)
Albumin: 4.3 g/dL (ref 3.5–5.2)
Alkaline Phosphatase: 53 U/L (ref 39–117)
BILIRUBIN TOTAL: 0.7 mg/dL (ref 0.2–1.2)
Bilirubin, Direct: 0.1 mg/dL (ref 0.0–0.3)
TOTAL PROTEIN: 7.1 g/dL (ref 6.0–8.3)

## 2013-08-19 LAB — TSH: TSH: 0.65 u[IU]/mL (ref 0.35–4.50)

## 2013-08-19 LAB — BASIC METABOLIC PANEL
BUN: 14 mg/dL (ref 6–23)
CHLORIDE: 103 meq/L (ref 96–112)
CO2: 28 mEq/L (ref 19–32)
CREATININE: 0.8 mg/dL (ref 0.4–1.2)
Calcium: 9.8 mg/dL (ref 8.4–10.5)
GFR: 80.23 mL/min (ref >60.00)
Glucose, Bld: 116 mg/dL — ABNORMAL HIGH (ref 70–99)
Potassium: 4.5 mEq/L (ref 3.5–5.1)
Sodium: 139 mEq/L (ref 135–145)

## 2013-08-19 LAB — LIPID PANEL
CHOL/HDL RATIO: 2
Cholesterol: 195 mg/dL (ref 0–200)
HDL: 86.2 mg/dL (ref >39.00)
LDL Cholesterol: 92 mg/dL (ref 0–99)
NONHDL: 108.8
Triglycerides: 85 mg/dL (ref 0.0–149.0)
VLDL: 17 mg/dL (ref 0.0–40.0)

## 2013-08-19 LAB — VITAMIN D 25 HYDROXY (VIT D DEFICIENCY, FRACTURES): VITD: 66.91 ng/mL

## 2013-08-19 NOTE — Patient Instructions (Signed)
Today we updated your med list in our EPIC system...    Continue your current medications the same...    We refilled your meds per request...  Today we did your follow up CXR, EKG, & FASTING blood work...    We will contact you w/ the results when available...   You should have a follow up Bone Density Test>>    This can be done here, at your GYN office, or where you get your mammograms...    Continue your Vitamins, Calcium, & VitD supplement...     Continue your exercise program...  Call for any questions...  Let's plan a follow up visit in [phone], sooner if needed for problems.Marland Kitchen..Marland Kitchen

## 2013-08-19 NOTE — Progress Notes (Signed)
Subjective:    Patient ID: Ashley [name], female    DOB: 1940/12/06, 73 y.o.   MRN: [id number]  HPI 73 y/o WF here for a follow up visit...  She has mult medical problems including:  Hypothyroidism;  Divertics/ Hems;  DJD/ Osteopenia;  Anxiety...  ~  May 27, 2010:  2 yr ROV and she has been under a lot of stress w/ daughter's divorce "it's taking a toll on me";  She was having weak spells & BP sl labile but has settled at 140s/ 80s at home checks;  She notes that for the weak spells she will go to sleep for relief & wakes feeling better, also eating more freq smaller meals helps "even eating celery helps";  She requests increase in her Ativan Rx, fasting blood work, and Synthroid refill today...  Also c/o some right shoulder discomfort & inability to fully abduct right arm- [facility]- we discussed Ortho eval (she will call DrWainer when she is ready for this)...  ~  May 31, 2011:  Yearly ROV & [name] reports that she was Dx w/ overactive bladder by DrOttelin & started on new medication MyrbetriqSR 25mg /d- and it seems to be helping;  She wants me to know that she stopped her Norvasc on her own because she didn't think she needed it (stress level is decr & home BP checks all good in the 120-130 range) & claims it caused constipation;  She denies CP, palpit, dizzy, SOB, edema, etc...  See prob list below>>  We reviewed medical issues, meds, XRays, & lab work...  CXR 4/13 showed normal heart size, clear lungs, WNL.Marland Kitchen.Marland Kitchen.  LABS 4/13:  FLP- ok on diet;  Chems- ok w/ BS107;  CBC- wnl;  TSH=1.33, VitD=67    ~  August 15, 2012:  29mo ROV & [name] is doing well- no new complaints or concerns just incr stress w/ husb illness 7 rec to take the Lorazepam more regularly;  We reviewed the following medical problems during today's office visit >>     Hx HBP, AtypCP> on Amlod5, ASA81; BP= 124/82 & she denies CP, palpit, dizzy, SOB, edema...    Hypothyroid> on Levothy100; she is clinically &  biochemically euthyroid; labs 7/14 showed TSH= 0.94    GI- Divertics, Hems> followed by DrKaplan & last colon was 5/13 by DrKaplan- scat divertics only...    GU- OB> prev eval by DrOttelin & symptoms improved on Myrbetriq; she has since stopped the med 7 decr fluid intake w/ same result & denies "gotta go" symptoms...    DJD> followed by DrWainer on OTC analgesics as needed...    Osteopenia> on Calcium, Mag, MVI, VitD1000; BMDs per Gyn & she tells me she is stable & doing ok on her supplements; "I feel stronger & I exercise"...    Anxiety> on Lorazepam 1mg  prn...    Hx skin cancer> aware... We reviewed prob list, meds, xrays and labs> see below for updates >>   LABS 7/14:  FLP- at goals on diet alone;  Chems- ok x BS=110;  CBC- wnl;  TSH=0.94 on Levothy100...  ~  August 19, 2013:  6336yr ROV & [name] continues to do well- no new complaints or concerns, on same 3 meds below; she has seen a chiropractor recently w/ LBP & feet tingling- improved w/ the adjustments... We reviewed the following medical problems during today's office visit >>     Hx HBP, AtypCP> on Amlod5, ASA81; BP= 120/78 & she denies CP,  palpit, dizzy, SOB, edema; [facility]...    Hypothyroid> on Levothy100; she is clinically & biochemically euthyroid; labs 7/15 showed TSH= 0.65    Elev FBS> Labs 7/15 showed FBS=116 & she is advised in low carb diet...    GI- Divertics, Hems> followed by DrKaplan & last colon was 5/13 by DrKaplan- scat divertics only & f/u planned 94yrs...    GU- OB> prev eval by DrOttelin & symptoms improved on Myrbetriq; she has since stopped the med & decr fluid intake w/ same result & denies "gotta go" symptoms...    DJD> followed by DrWainer on OTC analgesics as needed; 7/15 c/o some LBP & seeing a chiropractor w/ relief from his adjustments...    Osteopenia> on Calcium, Mag, MVI, VitD1000; BMDs per Gyn in past (?last 2009 w/ lowest Tscore -2.0) & she is INTOL to Bisphos rx; needs f/u BMD (asked to  sched here, at GYN, or at Radiology center)...    Anxiety> on Lorazepam 1mg  prn...    Hx skin cancer> aware, she is encouraged to f/u w/ Derm yearly... We reviewed prob list, meds, xrays and labs> see below for updates >>   CXR 7/15 showed norm heart size, clear lungs, NAD.Marland KitchenMarland Kitchen   EKG 7/15 showed NSR, rate67, wnl, NAD...  LABS 7/15:  FLP- wnl on diet alone;  Chems- ok x BS=116;  CBC- wnl;  TSH=0.65;  VitD=67...          Problem List:     HYPERTENSION, Borderline >> prev on Amlodipine 5mg /d- pt stopped on her own. Hx of CHEST PAIN, ATYPICAL (ICD-786.59) - on ASA 81mg /d... ~  NuclearStressTest 3/02 was normal w/o scar or ischemia, EF=81%. ~  ABI's 12/07 were normal (c/o pain & numbness in legs). ~  CXR 4/13 showed normal heart size, clear lungs, wnl... ~  4/13:  She stopped Amlod5 on her own- denies CP, palpit, dyspnea, syncope, edema, etc... ~  7/14: on Amlod5, ASA81; BP= 124/82 & she denies CP, palpit, dizzy, SOB, edema ~  7/15: on Amlod5, ASA81; BP= 120/78 & she denies CP, palpit, dizzy, SOB, edema; [facility].   HYPOTHYROIDISM (ICD-244.9) - currently on SYNTHROID 167mcg/d she doesn't want generic...  ~  on Synthroid Rx x yrs w/ TSH ~14 in 1983 & med started by DrESL. ~  Thyroid Ultrasound 3/10 showed = sl goiter, no nodules seen. ~  Labs & Synthroid dose have been adjusted slightly over the yrs... ~  Labs 4/12 on Synth112 showed TSH= 0.30... rec sl decr to 175mcg/d. ~  Labs 4/13 on Synth100 showed TSH= 1.33... Continue same. 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 f/u planned 38yrs. ~  f/u colonoscopy 5/13 by DrKaplan showed scat divertics, otherw neg & f/u planned in 10  yrs...  DEGENERATIVE ARTHRITIS (ICD-715.90) - she uses OTC anti-inflamm meds Prn. ~  4/12: c/o right shoulder pain & decr ROM, advised to get eval by Ortho==>DrWainer, Dx rotator cuff tendonitis vs part tear; given shot, PT, Mobic15, & consider MRI. ~  4/13:  She reports improved, no problem...  OSTEOPOROSIS (ICD-733.00) - prev Actonel was stopped 9/09; taking Ca++/ Vits/ Vit D... ~  BMD 12/07 showed TScores -0.8 (hips) to -2.0 (spine)... sl  improved from 2005 and before. ~  pt states VitD level was OK per DrMcPhail... ~  repeat BMD 12/09 by [facility] showed TScores -1.3 R FemNeck & -2.0 in Spine... she has stopped the Bisphos therapy due to dental problems and feels she is improved off this med... ~  4/12:  She tells me that GYN continues to follow her BMDs & manages this prob for her; she refuses all bisphos therapies... ~  7/15: she is in need of f/u BMD but declines at this time...  FATIGUE (ICD-780.79)  ANXIETY (ICD-300.00) - on LORAZEPAM 1mg  as needed... usually takes 1 Qhs... ~  3/10:  she relates a story where her brother developed DM and later died from pancreatic cancer... she likes to get copies of all her lab work and noted her BS was 116 & she thinks that she is diabetic and may also have pancreatic cancer causing this... we discussed this in detail & I tried to reassure her--- we will check BS=109/ A1c=5.8/ Abd Ultrasound= WNL.Marland Kitchen. ~  4/12:  Under a lot of stress w/ daughter's divorce> she wants to incr the LORAZEPAM to 1mg  tabs- 1/2 to 1 tab Tid Prn... ~  4/13:  She notes that stress is diminished but still taking ativan 1mg  Qhs for sleep...  SKIN CANCER, HX OF (ICD-V10.83) - DrHall removed a squamous cell lesion from chest wall... SHINGLES - she had a bout of shingles Oct08 on her left hip area, treated at the ER w/ cortisone shot and Famvir (she refused Pred tablets due to her osteopenia)...  HEALTH MAINTENANCE -  ~  FLP 7/15 showed TChol 195, TG 85, HDL 86, LDL 92... on  diet alone. ~  GYN= [facility] now & she saw Debbora Dus for PAP etc... She needs f/u BMD but declines at this time... ~  Urology, DrOttelin for overactive bladder eval & Rx w/ Myrbetriq... ~  Immuniz:  She gets the yearly seasonal Flu vaccines...   Past Surgical History  Procedure Laterality Date  . Colonoscopy      Outpatient Encounter Prescriptions as of 08/19/2013  Medication Sig  . amLODipine (NORVASC) 5 MG tablet TAKE 1 TABLET BY MOUTH ONCE DAILY  . aspirin 81 MG tablet Take 81 mg by mouth daily.    . Calcium Carbonate-Vitamin D (CALTRATE 600+D) 600-400 MG-UNIT per tablet Take 1 tablet by mouth 2 (two) times daily.    . Cholecalciferol (VITAMIN D) 1000 UNITS capsule Take 1,000 Units by mouth daily.    . fish oil-omega-3 fatty acids 1000 MG capsule Take 1 g by mouth daily.  Marland Kitchen LORazepam (ATIVAN) 1 MG tablet Take 1/2 to 1 tablet by mouth three times daily as needed  . Magnesium Oxide (MAG-200 PO) Take by mouth 3 (three) times a week.  . Multiple Vitamin (MULTIVITAMIN) tablet Take 1 tablet by mouth daily. Take one Women's MVI Daily  . SYNTHROID 100 MCG tablet Take 1 tablet by mouth once daily  . [DISCONTINUED] SYNTHROID 100 MCG tablet TAKE 1 TABLET BY MOUTH ONCE DAILY    Allergies  Allergen Reactions  . Alendronate Sodium     REACTION: pt states "blisters in my mouth"  . Latex     sensitive  . Penicillins     REACTION: fatigue/malaise, "feels extremely bad"  . Raloxifene     REACTION: pt states "it dried me out"  . Risedronate Sodium     REACTION: pt states "teeth problems"    Review of Systems  See HPI - all other systems neg except as noted...       The patient c/o anxiety, weak spells, & pain in the right shoulder;  She denies anorexia, fever, weight loss, weight gain, vision loss, decreased hearing, hoarseness, chest pain, syncope, dyspnea on exertion, peripheral edema, prolonged cough, headaches, hemoptysis, abdominal pain, melena, hematochezia, severe  indigestion/heartburn, hematuria, incontinence, suspicious skin lesions, transient blindness, difficulty walking, depression, unusual weight change, abnormal bleeding, enlarged lymph nodes, and angioedema.     Objective:   Physical Exam      WD, WN, 73 y/o WF in NAD... GENERAL:  Alert & oriented; pleasant & cooperative... HEENT:  [place]/AT, EOM-wnl, PERRLA, EACs-clear, TMs-wnl, NOSE-clear, THROAT-clear & wnl. NECK:  Supple w/ full ROM; no JVD; normal carotid impulses w/o bruits; no thyromegaly or nodules palpated; no lymphadenopathy. CHEST:  Clear to P & A; without wheezes/ rales/ or rhonchi. HEART:  Regular Rhythm; without murmurs/ rubs/ or gallops. ABDOMEN:  Soft & nontender; normal bowel sounds; no organomegaly or masses detected; +left fem bruit. EXT: without deformities, mild arthritic changes & decr ROM right shoulder; no varicose veins/ venous insuffic/ or edema. NEURO:  CN's intact;  no focal neuro deficits... DERM:  No lesions noted; no rash etc...  RADIOLOGY DATA:  Reviewed in the EPIC EMR & discussed w/ the patient...  LABORATORY DATA:  Reviewed in the EPIC EMR & discussed w/ the patient...   Assessment & Plan:    HBP>  Controlled on her Amlod5...  HYPOTHYROIDISM>  She is clinically euthyroid on Synthroid 170mcg/d; TSH is now WNL, continue same...  Elev FBS>  FBS 7/15 = 116 7 we discussed low carb diet...  DIVERTICS>  Follow up colon done 5/13 & showed just scat divertics...  Overactive Bladder>  Dx by DrOttelin during recent eval, her voiding symptoms are improved on Myrbetiq=> stopped on her own 7 doing satis w. decr fluids she says...  OSTEOARTHRITIS>  She has some right shoulder pain & decr ROM (can't abduct);  She saw DrWainer & he Rx w/ shot + Medrol, etc,,,  OSTEOPENIA>  She is on Caltrate/ MVI/ Vit D & she is followed by her GYN;  Advised to have f/u BMD on sched so they can address the need for medication if nec...  ANXIETY>   On Lorazepam to 1mg  tabs- 1/2 to 1  tab  Tid Prn & she tells me she is using one qhs...   Patient's Medications  New Prescriptions   No medications on file  Previous Medications   AMLODIPINE (NORVASC) 5 MG TABLET    TAKE 1 TABLET BY MOUTH ONCE DAILY   ASPIRIN 81 MG TABLET    Take 81 mg by mouth daily.     CALCIUM CARBONATE-VITAMIN D (CALTRATE 600+D) 600-400 MG-UNIT PER TABLET    Take 1 tablet by mouth 2 (two) times daily.     CHOLECALCIFEROL (VITAMIN D) 1000 UNITS CAPSULE    Take 1,000 Units by mouth daily.     FISH OIL-OMEGA-3 FATTY ACIDS 1000 MG CAPSULE    Take 1 g by mouth daily.   LORAZEPAM (ATIVAN) 1 MG TABLET    Take 1/2 to 1 tablet by mouth three times daily as needed   MAGNESIUM OXIDE (MAG-200 PO)    Take by mouth 3 (three) times a week.   MULTIPLE VITAMIN (MULTIVITAMIN) TABLET    Take 1 tablet by mouth daily. Take one Women's MVI Daily   SYNTHROID 100 MCG TABLET    Take 1 tablet by mouth once  daily  Modified Medications   No medications on file  Discontinued Medications   SYNTHROID 100 MCG TABLET    TAKE 1 TABLET BY MOUTH ONCE DAILY

## 2013-09-13 ENCOUNTER — Telehealth: Payer: Self-pay | Admitting: Pulmonary Disease

## 2013-09-13 NOTE — Telephone Encounter (Signed)
Spoke with patient in lobby-I gave her the number, address, and [facility] for [facility]. Nothing more needed at this time.

## 2013-11-11 ENCOUNTER — Other Ambulatory Visit: Payer: Self-pay | Admitting: Pulmonary Disease

## 2013-11-14 ENCOUNTER — Other Ambulatory Visit: Payer: Self-pay | Admitting: *Deleted

## 2013-11-14 MED ORDER — AMLODIPINE BESYLATE 5 MG PO TABS: ORAL_TABLET | ORAL | Status: DC

## 2013-12-23 ENCOUNTER — Other Ambulatory Visit: Payer: Self-pay | Admitting: Pulmonary Disease

## 2014-05-16 ENCOUNTER — Other Ambulatory Visit: Payer: Self-pay | Admitting: Pulmonary Disease

## 2014-06-17 ENCOUNTER — Other Ambulatory Visit (INDEPENDENT_AMBULATORY_CARE_PROVIDER_SITE_OTHER): Payer: Medicare Other

## 2014-06-17 ENCOUNTER — Encounter: Payer: Self-pay | Admitting: Pulmonary Disease

## 2014-06-17 ENCOUNTER — Ambulatory Visit (INDEPENDENT_AMBULATORY_CARE_PROVIDER_SITE_OTHER): Payer: Medicare Other | Admitting: Pulmonary Disease

## 2014-06-17 VITALS — BP 132/78 | HR 61 | Temp 98.0°F | Wt 146.0 lb

## 2014-06-17 DIAGNOSIS — Z23 Encounter for immunization: Secondary | ICD-10-CM | POA: Diagnosis not present

## 2014-06-17 DIAGNOSIS — E7800 Pure hypercholesterolemia, unspecified: Secondary | ICD-10-CM

## 2014-06-17 DIAGNOSIS — E039 Hypothyroidism, unspecified: Secondary | ICD-10-CM

## 2014-06-17 DIAGNOSIS — M859 Disorder of bone density and structure, unspecified: Secondary | ICD-10-CM

## 2014-06-17 DIAGNOSIS — F411 Generalized anxiety disorder: Secondary | ICD-10-CM

## 2014-06-17 DIAGNOSIS — R030 Elevated blood-pressure reading, without diagnosis of hypertension: Secondary | ICD-10-CM

## 2014-06-17 DIAGNOSIS — E559 Vitamin D deficiency, unspecified: Secondary | ICD-10-CM

## 2014-06-17 DIAGNOSIS — K5730 Diverticulosis of large intestine without perforation or abscess without bleeding: Secondary | ICD-10-CM

## 2014-06-17 DIAGNOSIS — M8580 Other specified disorders of bone density and structure, unspecified site: Secondary | ICD-10-CM

## 2014-06-17 LAB — CBC WITH DIFFERENTIAL/PLATELET
BASOS ABS: 0 10*3/uL (ref 0.0–0.1)
Basophils Relative: 0.6 % (ref 0.0–3.0)
Eosinophils Absolute: 0 10*3/uL (ref 0.0–0.7)
Eosinophils Relative: 0.8 % (ref 0.0–5.0)
HCT: 40.5 % (ref 36.0–46.0)
Hemoglobin: 13.8 g/dL (ref 12.0–15.0)
LYMPHS PCT: 42 % (ref 12.0–46.0)
Lymphs Abs: 1.8 10*3/uL (ref 0.7–4.0)
MCHC: 34.2 g/dL (ref 30.0–36.0)
MCV: 90.5 fl (ref 78.0–100.0)
Monocytes Absolute: 0.3 10*3/uL (ref 0.1–1.0)
Monocytes Relative: 7.1 % (ref 3.0–12.0)
NEUTROS PCT: 49.5 % (ref 43.0–77.0)
Neutro Abs: 2.1 10*3/uL (ref 1.4–7.7)
PLATELETS: 203 10*3/uL (ref 150.0–400.0)
RBC: 4.48 Mil/uL (ref 3.87–5.11)
RDW: 13.4 % (ref 11.5–15.5)
WBC: 4.3 10*3/uL (ref 4.0–10.5)

## 2014-06-17 LAB — LIPID PANEL
CHOL/HDL RATIO: 2
Cholesterol: 193 mg/dL (ref 0–200)
HDL: 78.4 mg/dL (ref >39.00)
LDL CALC: 93 mg/dL (ref 0–99)
NonHDL: 114.6
TRIGLYCERIDES: 109 mg/dL (ref 0.0–149.0)
VLDL: 21.8 mg/dL (ref 0.0–40.0)

## 2014-06-17 LAB — HEPATIC FUNCTION PANEL
ALBUMIN: 4.1 g/dL (ref 3.5–5.2)
ALT: 17 U/L (ref 0–35)
AST: 18 U/L (ref 0–37)
Alkaline Phosphatase: 58 U/L (ref 39–117)
Bilirubin, Direct: 0.1 mg/dL (ref 0.0–0.3)
Total Bilirubin: 0.7 mg/dL (ref 0.2–1.2)
Total Protein: 7.3 g/dL (ref 6.0–8.3)

## 2014-06-17 LAB — VITAMIN D 25 HYDROXY (VIT D DEFICIENCY, FRACTURES): VITD: 61.24 ng/mL (ref 30.00–100.00)

## 2014-06-17 LAB — TSH: TSH: 0.6 u[IU]/mL (ref 0.35–4.50)

## 2014-06-17 MED ORDER — LORAZEPAM 1 MG PO TABS: 0.5000 mg | ORAL_TABLET | Freq: Three times a day (TID) | ORAL | Status: DC | PRN

## 2014-06-17 MED ORDER — AMLODIPINE BESYLATE 5 MG PO TABS: ORAL_TABLET | ORAL | Status: DC

## 2014-06-17 NOTE — Progress Notes (Signed)
Subjective:    Patient ID: Ashley [name], female    DOB: 1941/01/09, 74 y.o.   MRN: [id number]  HPI 74 y/o WF here for a follow up visit...  She has mult medical problems including:  Hypothyroidism;  Divertics/ Hems;  DJD/ Osteopenia;  Anxiety... ~  SEE PREV EPIC NOTES FOR OLDER DATA >>   ~  May 27, 2010:  2 yr ROV and she has been under a lot of stress w/ daughter's divorce "it's taking a toll on me" ~  May 31, 2011:  Yearly ROV & Terressa reports that she was Dx w/ overactive bladder by DrOttelin & started on MyrbetriqSR 25mg /d  CXR 4/13 showed normal heart size, clear lungs, WNL.Marland KitchenMarland Kitchen  LABS 4/13:  FLP- ok on diet;  Chems- ok w/ BS107;  CBC- wnl;  TSH=1.33, VitD=67    ~  August 15, 2012:  20mo ROV & [name] is doing well- no new complaints or concerns just incr stress w/ husb illness 7 rec to take the Lorazepam more regularly;  We reviewed the following medical problems during today's office visit >>     Hx HBP, AtypCP> on Amlod5, ASA81; BP= 124/82 & she denies CP, palpit, dizzy, SOB, edema...    Hypothyroid> on Levothy100; she is clinically & biochemically euthyroid; labs 7/14 showed TSH= 0.94    GI- Divertics, Hems> followed by DrKaplan & last colon was 5/13 by DrKaplan- scat divertics only...    GU- OB> prev eval by DrOttelin & symptoms improved on Myrbetriq; she has since stopped the med 7 decr fluid intake w/ same result & denies "gotta go" symptoms...    DJD> followed by DrWainer on OTC analgesics as needed...    Osteopenia> on Calcium, Mag, MVI, VitD1000; BMDs per Gyn & she tells me she is stable & doing ok on her supplements; "I feel stronger & I exercise"...    Anxiety> on Lorazepam 1mg  prn...    Hx skin cancer> aware... We reviewed prob list, meds, xrays and labs> see below for updates >>   LABS 7/14:  FLP- at goals on diet alone;  Chems- ok x BS=110;  CBC- wnl;  TSH=0.94 on Levothy100...  ~  August 19, 2013:  3yr ROV & Elexis continues to do well- no new complaints or concerns, on same 3  meds below; she has seen a chiropractor recently w/ LBP & feet tingling- improved w/ the adjustments... We reviewed the following medical problems during today's office visit >>     Hx HBP, AtypCP> on Amlod5, ASA81; BP= 120/78 & she denies CP, palpit, dizzy, SOB, edema; [facility]...    Hypothyroid> on Levothy100; she is clinically & biochemically euthyroid; labs 7/15 showed TSH= 0.65    Elev FBS> Labs 7/15 showed FBS=116 & she is advised in low carb diet...    GI- Divertics, Hems> followed by DrKaplan & last colon was 5/13 by DrKaplan- scat divertics only & f/u planned 39yrs...    GU- OB> prev eval by DrOttelin & symptoms improved on Myrbetriq; she has since stopped the med & decr fluid intake w/ same result & denies "gotta go" symptoms...    DJD> followed by DrWainer on OTC analgesics as needed; 7/15 c/o some LBP & seeing a chiropractor w/ relief from his adjustments...    Osteopenia> on Calcium, Mag, MVI, VitD1000; BMDs per Gyn in past (?last 2009 w/ lowest Tscore -2.0) & she is INTOL to Bisphos rx; needs f/u BMD (asked to sched here, at GYN, or  at Radiology center)...    Anxiety> on Lorazepam 1mg  prn...    Hx skin cancer> aware, she is encouraged to f/u w/ Derm yearly... We reviewed prob list, meds, xrays and labs> see below for updates >>   CXR 7/15 showed norm heart size, clear lungs, NAD.Marland KitchenMarland Kitchen   EKG 7/15 showed NSR, rate67, wnl, NAD...  LABS 7/15:  FLP- wnl on diet alone;  Chems- ok x BS=116;  CBC- wnl;  TSH=0.65;  VitD=67...  ~  Jun 17, 2014:  22mo ROV & [name] indicates that she is doing very well overall- notes that she's had several UTIs treated [facility] w/ antibiotics & wonders when she should f/u w/ DrOttelin to see if anything else needs to be done- suggested to call urology for their opinion...  We reviewed the following medical problems during today's office visit >>     Hx HBP, AtypCP> on Amlod5, ASA81; BP= 132/78 & she denies CP, palpit, dizzy, SOB,  edema; [facility]...    Hypothyroid> on Levothy100; she is clinically & biochemically euthyroid; labs 5/16 showed TSH= 0.60, continue same...    Elev FBS> Labs 7/15 showed FBS=116 & she is advised in low carb diet... Labs 5/16- BMet wasn't done.    GI- Divertics, Hems> followed by DrKaplan & last colon was 5/13- scat divertics only & f/u planned 8yrs...    GU- OB> prev eval by DrOttelin & symptoms improved on Myrbetriq; she has since stopped the med & decr fluid intake w/ same result & denies "gotta go" symptoms but has [facility] several UT[facility]...    DJD> followed by DrWainer on OTC analgesics as needed; 7/15 c/o some LBP & seeing a chiropractor w/ relief from his adjustments...    Osteopenia> on Calcium, Mag, MVI, VitD1000; BMDs per Gyn in past (?last 2009 w/ lowest Tscore -2.0) & she is INTOL to Bisphos rx; needs f/u BMD (asked to sched here, at GYN, or at Radiology center)...    Anxiety> on Lorazepam 1mg  prn...    Hx skin cancer> aware, she is encouraged to f/u w/ Derm yearly... We reviewed prob list, meds, xrays and labs> see below for updates >> We gave her the PREVNAR-13 today...   LABS 5/16:  FLP- at goals on diet alone;  Chems- ok;  CBC- wnl;  TSH=0.60;  VitD=61...           Problem List:     HYPERTENSION, Borderline >> prev on Amlodipine 5mg /d- pt stopped on her own. Hx of CHEST PAIN, ATYPICAL (ICD-786.59) - on ASA 81mg /d... ~  NuclearStressTest 3/02 was normal w/o scar or ischemia, EF=81%. ~  ABI's 12/07 were normal (c/o pain & numbness in legs). ~  CXR 4/13 showed normal heart size, clear lungs, wnl... ~  4/13:  She stopped Amlod5 on her own- denies CP, palpit, dyspnea, syncope, edema, etc... ~  7/14: on Amlod5, ASA81; BP= 124/82 & she denies CP, palpit, dizzy, SOB, edema ~  7/15: on Amlod5, ASA81; BP= 120/78 & she denies CP, palpit, dizzy, SOB, edema; [facility].  ~  5/16: on Amlod5, ASA81; BP= 132/78 & she remains asymptomatic &  active...  HYPOTHYROIDISM (ICD-244.9) - currently on SYNTHROID 115mcg/d she doesn't want generic...  ~  on Synthroid Rx x yrs w/ TSH ~14 in 1983 & med started by DrESL. ~  Thyroid Ultrasound 3/10 showed = sl goiter, no nodules seen. ~  Labs & Synthroid dose have been adjusted slightly  over the yrs... ~  Labs 4/12 on Synth112 showed TSH= 0.30... rec sl decr to 115mcg/d. ~  Labs 4/13 on Synth100 showed TSH= 1.33... Continue same. 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 f/u planned 44yrs. ~  f/u colonoscopy 5/13 by DrKaplan showed scat divertics, otherw neg & f/u planned in 10 yrs...  DEGENERATIVE ARTHRITIS (ICD-715.90) - she uses OTC anti-inflamm meds Prn. ~  4/12: c/o right shoulder pain & decr ROM, advised to get eval by Ortho==>DrWainer, Dx rotator cuff tendonitis vs part tear; given shot, PT, Mobic15, & consider MRI. ~  4/13:  She reports improved, no problem...  OSTEOPOROSIS (ICD-733.00) - prev Actonel was stopped 9/09; taking Ca++/ Vits/ Vit D... ~  BMD 12/07 showed TScores -0.8 (hips) to -2.0 (spine)... sl improved from 2005 and before. ~  pt states VitD level was OK per DrMcPhail... ~  repeat BMD 12/09 by [facility] showed TScores -1.3 R FemNeck & -2.0 in Spine... she has stopped the Bisphos therapy due to dental problems and feels she is improved off this med... ~  4/12:  She tells me that GYN continues to follow her BMDs & manages this prob for her; she refuses all bisphos therapies... ~  7/15: she is in need of f/u BMD but declines at this time... ~  5/16:  Her VitD level =  61...  FATIGUE (ICD-780.79)  ANXIETY (ICD-300.00) - on LORAZEPAM  as needed... usually takes 1 Qhs... ~  3/10:  she relates a story where her brother developed DM and later died from pancreatic cancer... she likes to get copies of all her lab work and noted her BS was 116 & she thinks that she is diabetic and may also have pancreatic cancer causing this... we discussed this in detail & I tried to reassure her--- we will check BS=109/ A1c=5.8/ Abd Ultrasound= WNL.Marland Kitchen. ~  4/12:  Under a lot of stress w/ daughter's divorce> she wants to incr the LORAZEPAM to  tabs- 1/2 to 1 tab Tid Prn... ~  4/13:  She notes that stress is diminished but still taking ativan  Qhs for sleep...  SKIN CANCER, HX OF (ICD-V10.83) - DrHall removed a squamous cell lesion from chest wall... SHINGLES - she had a bout of shingles Oct08 on her left hip area, treated at the ER w/ cortisone shot and Famvir (she refused Pred tablets due to her osteopenia)...  HEALTH MAINTENANCE -  ~  FLP 7/15 showed TChol 195, TG 85, HDL 86, LDL 92... on diet alone. ~  GYN= [facility] now & she saw Debbora Dus for PAP etc... She needs f/u BMD but declines at this time... ~  Urology, DrOttelin for overactive bladder eval & Rx w/ Myrbetriq... ~  Immuniz:  She gets the yearly seasonal Flu vaccines...   Past Surgical History  Procedure Laterality Date  . Colonoscopy      Patient's Medications  >>  MEDS as of  06/17/14...   Previous Medications   AMLODIPINE (NORVASC) 5 MG TABLET    TAKE 1 TABLET BY MOUTH ONCE DAILY   ASPIRIN 81 MG TABLET    Take 81 mg  by mouth daily.     CALCIUM CARBONATE-VITAMIN D (CALTRATE 600+D) 600-400 MG-UNIT PER TABLET    Take 1 tablet by mouth daily.    CHOLECALCIFEROL (VITAMIN D) 1000 UNITS CAPSULE    Take 1,000 Units by mouth daily.     FISH OIL-OMEGA-3 FATTY ACIDS 1000 MG CAPSULE    Take 1 g by mouth daily.   LORAZEPAM (ATIVAN) 1 MG TABLET    TAKE 1/2 TO 1 TABLET BY MOUTH THREE TIMES DAILY AS NEEDED    MAGNESIUM OXIDE (MAG-200 PO)    Take by mouth 3 (three) times a week.   MULTIPLE VITAMIN (MULTIVITAMIN) TABLET    Take 1 tablet by mouth daily. Take one Women's MVI Daily   SYNTHROID 100 MCG TABLET    Take 1 tablet by mouth once a day  Modified Medications   No medications on file  Discontinued Medications   No medications on file    Allergies  Allergen Reactions  . Alendronate Sodium     REACTION: pt states "blisters in my mouth"  . Latex     sensitive  . Penicillins     REACTION: fatigue/malaise, "feels extremely bad"  . Raloxifene     REACTION: pt states "it dried me out"  . Risedronate Sodium     REACTION: pt states "teeth problems"    Current Medications, Allergies, Past Medical History, Past Surgical History, Family History, and Social History were reviewed in [employer] record.   Review of Systems        See HPI - all other systems neg except as noted...       The patient c/o anxiety, weak spells, & pain in the right shoulder;  She denies anorexia, fever, weight loss, weight gain, vision loss, decreased hearing, hoarseness, chest pain, syncope, dyspnea on exertion, peripheral edema, prolonged cough, headaches, hemoptysis, abdominal pain, melena, hematochezia, severe indigestion/heartburn, hematuria, incontinence, suspicious skin lesions, transient blindness, difficulty walking, depression, unusual weight change, abnormal bleeding, enlarged lymph nodes, and angioedema.     Objective:   Physical Exam      WD, WN, 74 y/o WF in NAD... GENERAL:  Alert & oriented; pleasant & cooperative... HEENT:  Gregory/AT, EOM-wnl, PERRLA, EACs-clear, TMs-wnl, NOSE-clear, THROAT-clear & wnl. NECK:  Supple w/ full ROM; no JVD; normal carotid impulses w/o bruits; no thyromegaly or nodules palpated; no lymphadenopathy. CHEST:  Clear to P & A; without wheezes/ rales/ or rhonchi. HEART:  Regular Rhythm; without murmurs/ rubs/ or gallops. ABDOMEN:  Soft & nontender; normal  bowel sounds; no organomegaly or masses detected; +left fem bruit. EXT: without deformities, mild arthritic changes & decr ROM right shoulder; no varicose veins/ venous insuffic/ or edema. NEURO:  CN's intact;  no focal neuro deficits... DERM:  No lesions noted; no rash etc...  RADIOLOGY DATA:  Reviewed in the EPIC EMR & discussed w/ the patient...  LABORATORY DATA:  Reviewed in the EPIC EMR & discussed w/ the patient...   Assessment & Plan:    HBP>  Controlled on her Amlod5...  HYPOTHYROIDISM>  She is clinically euthyroid on Synthroid 124mcg/d; TSH is now WNL, continue same...  Elev FBS>  FBS 7/15 = 116 & we discussed low carb diet...  DIVERTICS>  Follow up colon done 5/13 & showed just scat divertics...  Overactive Bladder>  Dx by DrOttelin, her voiding symptoms were improved on Myrbetiq=> stopped on her own & doing satis w/ decr fluids she says... 5/16> she's had several UTIs and may want to f/u w/ Urology.Marland KitchenMarland Kitchen  OSTEOARTHRITIS>  She has some right shoulder pain & decr ROM (can't abduct);  She saw DrWainer & he Rx w/ shot + Medrol, etc,,,  OSTEOPENIA>  She is on Caltrate/ MVI/ Vit D & she is followed by her GYN;  Advised to have f/u BMD on sched so they can address the need for medication if nec...  ANXIETY>   On Lorazepam to 1mg  tabs- 1/2 to 1 tab  Tid Prn & she tells me she is using one qhs...   Patient's Medications  New Prescriptions   No medications on file  Previous Medications   ASPIRIN 81 MG TABLET    Take 81 mg by mouth daily.     CALCIUM CARBONATE-VITAMIN D (CALTRATE 600+D) 600-400 MG-UNIT PER TABLET    Take 1 tablet by mouth daily.    CHOLECALCIFEROL (VITAMIN D) 1000 UNITS CAPSULE    Take 1,000 Units by mouth daily.     FISH OIL-OMEGA-3 FATTY ACIDS 1000 MG CAPSULE    Take 1 g by mouth daily.   MAGNESIUM OXIDE (MAG-200 PO)    Take by mouth 3 (three) times a week.   MULTIPLE VITAMIN (MULTIVITAMIN) TABLET    Take 1 tablet by mouth daily. Take one Women's MVI Daily    SYNTHROID 100 MCG TABLET    Take 1 tablet by mouth once a day  Modified Medications   Modified Medication Previous Medication   AMLODIPINE (NORVASC) 5 MG TABLET amLODipine (NORVASC) 5 MG tablet      TAKE 1 TABLET BY MOUTH ONCE DAILY    TAKE 1 TABLET BY MOUTH ONCE DAILY   LORAZEPAM (ATIVAN) 1 MG TABLET LORazepam (ATIVAN) 1 MG tablet      Take 0.5-1 tablets (0.5-1 mg total) by mouth 3 (three) times daily as needed.    TAKE 1/2 TO 1 TABLET BY MOUTH THREE TIMES DAILY AS NEEDED  Discontinued Medications   No medications on file

## 2014-06-17 NOTE — Patient Instructions (Signed)
Today we updated your med list in our EPIC system...    Continue your current medications the same...  Today we did your follow up FASTING blood work...    We will contact you w/ the results when available...   Keep up the good work w/ diet & exercise...  Today we gave you the PREVNAR-13 pneumonia shot (this is the last one you will need)...  Call for any questions...  Let's plan a follow up visit in [id number], sooner if needed for problems.Marland Kitchen..Marland Kitchen

## 2014-08-22 ENCOUNTER — Ambulatory Visit: Payer: Medicare Other | Admitting: Pulmonary Disease

## 2014-09-05 ENCOUNTER — Ambulatory Visit: Payer: Medicare Other | Admitting: Pulmonary Disease

## 2014-12-18 ENCOUNTER — Encounter: Payer: Self-pay | Admitting: Gastroenterology

## 2015-02-19 ENCOUNTER — Other Ambulatory Visit: Payer: Self-pay | Admitting: Pulmonary Disease

## 2015-02-23 NOTE — Telephone Encounter (Signed)
Refill request received for Lorazepam. Please advise. Thank you.

## 2015-02-26 ENCOUNTER — Other Ambulatory Visit: Payer: Self-pay | Admitting: *Deleted

## 2015-03-05 ENCOUNTER — Telehealth: Payer: Self-pay | Admitting: Pulmonary Disease

## 2015-03-05 MED ORDER — LORAZEPAM 1 MG PO TABS: 0.5000 mg | ORAL_TABLET | Freq: Three times a day (TID) | ORAL | Status: DC | PRN

## 2015-03-05 NOTE — Telephone Encounter (Signed)
Spoke with pt, aware of rx refill.  rx called in to preferred pharmacy.  Nothing further needed.

## 2015-03-05 NOTE — Telephone Encounter (Signed)
[phone] calling back

## 2015-03-05 NOTE — Telephone Encounter (Signed)
Per SN okay to refill 1/2-1 tab po TID PRN #90 x 5 refills

## 2015-03-05 NOTE — Telephone Encounter (Signed)
Spoke with pt, requesting lorazepam refill to [facility] on [address].   Last refill 06/17/14 #90 with 5 refills-take 0.5-1 tab tid prn  SN please advise on refill.  Thanks!

## 2015-03-05 NOTE — Telephone Encounter (Signed)
lmtcb X1 for pt  

## 2015-03-05 NOTE — Telephone Encounter (Signed)
lmtcb x1 for pt. 

## 2015-03-05 NOTE — Telephone Encounter (Signed)
Patient returned call, may be reached at [phone]

## 2015-05-20 ENCOUNTER — Telehealth: Payer: Self-pay | Admitting: Pulmonary Disease

## 2015-05-20 NOTE — Telephone Encounter (Signed)
Called and spoke to pt. Informed her of the recs per SN. Pt verbalized understanding and states she will keep current OV with SN. Nothing further needed at this time.

## 2015-05-20 NOTE — Telephone Encounter (Signed)
Pt states she has upcoming appt with SN in about 1 month; recently was here with her husband for appt and mentioned to SN about her anxiety issues. Pt noted that her BP readings have been up and down for about 2 days; readings as follows:  AM: 193/74 Shaky when woke up Took BP pill 130/64 next check  AM 150/78  Took pill 110/83 (lower than usual for patient)  154/76 this afternoon   Since calling in the office has felt steady and not the unusual feelings she had been having.  Current BP med: Norvasc 5 mg 1 tablet QAM Would like to know if she needs to come in sooner for OV and/or her blood work. She likes the BP medication and does not want to change or come off the medication-just wants rec's from SN.

## 2015-05-20 NOTE — Telephone Encounter (Signed)
Pt can decide if she wants to continue medication for now and keep appt for 1 month or we can bring her in sooner with SN. SN will not change or prescribe new medication(s) over the phone.

## 2015-06-17 ENCOUNTER — Other Ambulatory Visit (INDEPENDENT_AMBULATORY_CARE_PROVIDER_SITE_OTHER): Payer: Medicare Other

## 2015-06-17 ENCOUNTER — Encounter: Payer: Self-pay | Admitting: Pulmonary Disease

## 2015-06-17 ENCOUNTER — Ambulatory Visit (INDEPENDENT_AMBULATORY_CARE_PROVIDER_SITE_OTHER): Payer: Medicare Other | Admitting: Pulmonary Disease

## 2015-06-17 ENCOUNTER — Encounter (INDEPENDENT_AMBULATORY_CARE_PROVIDER_SITE_OTHER): Payer: Self-pay

## 2015-06-17 ENCOUNTER — Ambulatory Visit (INDEPENDENT_AMBULATORY_CARE_PROVIDER_SITE_OTHER)
Admission: RE | Admit: 2015-06-17 | Discharge: 2015-06-17 | Disposition: A | Discharge status: Home / Self Care | Payer: Medicare Other | Source: Ambulatory Visit | Attending: Pulmonary Disease | Admitting: Pulmonary Disease

## 2015-06-17 VITALS — BP 138/84 | HR 73 | Temp 97.5°F | Ht 67.0 in | Wt 149.2 lb

## 2015-06-17 DIAGNOSIS — E559 Vitamin D deficiency, unspecified: Secondary | ICD-10-CM

## 2015-06-17 DIAGNOSIS — E039 Hypothyroidism, unspecified: Secondary | ICD-10-CM

## 2015-06-17 DIAGNOSIS — K5730 Diverticulosis of large intestine without perforation or abscess without bleeding: Secondary | ICD-10-CM | POA: Diagnosis not present

## 2015-06-17 DIAGNOSIS — I10 Essential (primary) hypertension: Secondary | ICD-10-CM

## 2015-06-17 DIAGNOSIS — M8580 Other specified disorders of bone density and structure, unspecified site: Secondary | ICD-10-CM

## 2015-06-17 DIAGNOSIS — Z8639 Personal history of other endocrine, nutritional and metabolic disease: Secondary | ICD-10-CM | POA: Diagnosis not present

## 2015-06-17 DIAGNOSIS — F341 Dysthymic disorder: Secondary | ICD-10-CM | POA: Insufficient documentation

## 2015-06-17 LAB — LIPID PANEL
CHOLESTEROL: 194 mg/dL (ref 0–200)
HDL: 83 mg/dL (ref >39.00)
LDL CALC: 90 mg/dL (ref 0–99)
NonHDL: 111.21
TRIGLYCERIDES: 107 mg/dL (ref 0.0–149.0)
Total CHOL/HDL Ratio: 2
VLDL: 21.4 mg/dL (ref 0.0–40.0)

## 2015-06-17 LAB — CBC WITH DIFFERENTIAL/PLATELET
BASOS PCT: 0.5 % (ref 0.0–3.0)
Basophils Absolute: 0 10*3/uL (ref 0.0–0.1)
EOS PCT: 0.8 % (ref 0.0–5.0)
Eosinophils Absolute: 0 10*3/uL (ref 0.0–0.7)
HCT: 42.6 % (ref 36.0–46.0)
HEMOGLOBIN: 14.5 g/dL (ref 12.0–15.0)
LYMPHS ABS: 1.6 10*3/uL (ref 0.7–4.0)
Lymphocytes Relative: 30.2 % (ref 12.0–46.0)
MCHC: 33.9 g/dL (ref 30.0–36.0)
MCV: 92.8 fl (ref 78.0–100.0)
MONO ABS: 0.4 10*3/uL (ref 0.1–1.0)
MONOS PCT: 7.5 % (ref 3.0–12.0)
Neutro Abs: 3.3 10*3/uL (ref 1.4–7.7)
Neutrophils Relative %: 61 % (ref 43.0–77.0)
Platelets: 219 10*3/uL (ref 150.0–400.0)
RBC: 4.59 Mil/uL (ref 3.87–5.11)
RDW: 13.6 % (ref 11.5–15.5)
WBC: 5.4 10*3/uL (ref 4.0–10.5)

## 2015-06-17 LAB — COMPREHENSIVE METABOLIC PANEL
ALBUMIN: 4.4 g/dL (ref 3.5–5.2)
ALK PHOS: 44 U/L (ref 39–117)
ALT: 17 U/L (ref 0–35)
AST: 15 U/L (ref 0–37)
BUN: 15 mg/dL (ref 6–23)
CALCIUM: 9.9 mg/dL (ref 8.4–10.5)
CHLORIDE: 102 meq/L (ref 96–112)
CO2: 29 mEq/L (ref 19–32)
Creatinine, Ser: 0.76 mg/dL (ref 0.40–1.20)
GFR: 78.62 mL/min (ref >60.00)
Glucose, Bld: 122 mg/dL — ABNORMAL HIGH (ref 70–99)
POTASSIUM: 4.1 meq/L (ref 3.5–5.1)
Sodium: 138 mEq/L (ref 135–145)
TOTAL PROTEIN: 7.2 g/dL (ref 6.0–8.3)
Total Bilirubin: 0.6 mg/dL (ref 0.2–1.2)

## 2015-06-17 LAB — TSH: TSH: 1.63 u[IU]/mL (ref 0.35–4.50)

## 2015-06-17 LAB — VITAMIN D 25 HYDROXY (VIT D DEFICIENCY, FRACTURES): VITD: 52.54 ng/mL (ref 30.00–100.00)

## 2015-06-17 MED ORDER — CITALOPRAM HYDROBROMIDE 20 MG PO TABS: 20.0000 mg | ORAL_TABLET | Freq: Every day | ORAL | Status: DC

## 2015-06-17 NOTE — Patient Instructions (Signed)
Today we updated your med list in our EPIC system...    Continue your current medications the same...  We decided to try CELEXA (Citalopram) 20mg  - one tab daily for depression...  Today we checked your follow up CXR & FASTING blood work...    We will contact you w/ the results when available...   Call for any questions, and we can adjust the medication dose as needed...  Let's plan a follow up visit in 1434yr, sooner if needed for any problems.Marland Kitchen..Marland Kitchen

## 2015-06-17 NOTE — Progress Notes (Signed)
Subjective:    Patient ID: Ashley [name], female    DOB: 1940-05-20, 75 y.o.   MRN: [id number]  HPI 75 y/o WF here for a follow up visit...  She has mult medical problems including:  Hypothyroidism;  Divertics/ Hems;  DJD/ Osteopenia;  Anxiety... ~  SEE PREV EPIC NOTES FOR OLDER DATA >>   ~  May 27, 2010:  2 yr ROV and she has been under a lot of stress w/ daughter's divorce "it's taking a toll on me" ~  May 31, 2011:  Yearly ROV & Ashley [name] reports that she was Dx w/ overactive bladder by DrOttelin & started on MyrbetriqSR /d  CXR 4/13 showed normal heart size, clear lungs, WNL.Marland KitchenMarland Kitchen  LABS 4/13:  FLP- ok on diet;  Chems- ok w/ BS107;  CBC- wnl;  TSH=1.33, VitD=67    LABS 7/14:  FLP- at goals on diet alone;  Chems- ok x BS=110;  CBC- wnl;  TSH=0.94 on Levothy100...  ~  August 19, 2013:  4yr ROV & Ashley [name] continues to do well- no new complaints or concerns, on same 3 meds below; she has seen a chiropractor recently w/ LBP & feet tingling- improved w/ the adjustments... We reviewed the following medical problems during today's office visit >>     Hx HBP, AtypCP> on Amlod5, ASA81; BP= 120/78 & she denies CP, palpit, dizzy, SOB, edema; [facility]...    Hypothyroid> on Levothy100; she is clinically & biochemically euthyroid; labs 7/15 showed TSH= 0.65    Elev FBS> Labs 7/15 showed FBS=116 & she is advised in low carb diet...    GI- Divertics, Hems> followed by DrKaplan & last colon was 5/13 by DrKaplan- scat divertics only & f/u planned 72yrs...    GU- OB> prev eval by DrOttelin & symptoms improved on Myrbetriq; she has since stopped the med & decr fluid intake w/ same result & denies "gotta go" symptoms...    DJD> followed by DrWainer on OTC analgesics as needed; 7/15 c/o some LBP & seeing a chiropractor w/ relief from his adjustments...    Osteopenia> on Calcium, Mag, MVI, VitD1000; BMDs per Gyn in past (?last 2009 w/ lowest Tscore -2.0) & she is INTOL to Bisphos rx; needs f/u BMD  (asked to sched here, at GYN, or at Radiology center)...    Anxiety> on Lorazepam  prn...    Hx skin cancer> aware, she is encouraged to f/u w/ Derm yearly... We reviewed prob list, meds, xrays and labs> see below for updates >>   CXR 7/15 showed norm heart size, clear lungs, NAD.Marland KitchenMarland Kitchen   EKG 7/15 showed NSR, rate67, wnl, NAD...  LABS 7/15:  FLP- wnl on diet alone;  Chems- ok x BS=116;  CBC- wnl;  TSH=0.65;  VitD=67...  ~  Jun 17, 2014:  81mo ROV & Ashley [name] indicates that she is doing very well overall- notes that she's had several UTIs [facility] w/ antibiotics & wonders when she should f/u w/ DrOttelin to see if anything else needs to be done- suggested to call urology for their opinion...  We reviewed the following medical problems during today's office visit >>     Hx HBP, AtypCP> on Amlod5, ASA81; BP= 132/78 & she denies CP, palpit, dizzy, SOB, edema; [facility]...    Hypothyroid> on Levothy100; she is clinically & biochemically euthyroid; labs 5/16 showed TSH= 0.60, continue same...    Elev FBS> Labs 7/15 showed FBS=116 & she is  advised in low carb diet... Labs 5/16- BMet wasn't done.    GI- Divertics, Hems> followed by DrKaplan & last colon was 5/13- scat divertics only & f/u planned 59yrs...    GU- OB> prev eval by DrOttelin & symptoms improved on Myrbetriq; she has since stopped the med & decr fluid intake w/ same result & denies "gotta go" symptoms but has [facility]...    DJD> followed by DrWainer on OTC analgesics as needed; 7/15 c/o some LBP & seeing a chiropractor w/ relief from his adjustments...    Osteopenia> on Calcium, Mag, MVI, VitD1000; BMDs per Gyn in past (?last 2009 w/ lowest Tscore -2.0) & she is INTOL to Bisphos rx; needs f/u BMD (asked to sched here, at GYN, or at Radiology center)...    Anxiety> on Lorazepam 1mg  prn...    Hx skin cancer> aware, she is encouraged to f/u w/ Derm yearly... We reviewed prob list, meds,  xrays and labs> see below for updates >> We gave her the PREVNAR-13 today...   LABS 5/16:  FLP- at goals on diet alone;  Chems- ok;  CBC- wnl;  TSH=0.60;  VitD=61...   ~  Jun 17, 2015:  65yr ROV & medical follow up visit- Ashley [name] is here w/ her daughter; pt under a lot of stress w/ husband's recent illness (stroke, PAF) and her brother is ill in [place]; she feels she's depressed and notes that her GYN tried Zoloft ?dose but pt stopped it 7 says "intol" w/ shaking, elev HR, etc "it's my age" she says;  She is using Lorazepam 1mg Qhs but not taking it during the day;  We discussed using 1/2 Lorazepam prn days and try a diff antidepressant like CELEXA 20mg  daily (she's not sure that she wants it but requests Rx & says she'll start it when she's ready);  We discussed counseling 7 offered appt at [facility] behav health w/ Judithe Modest...     Hx HBP, AtypCP> on Amlod5, [id number]; BP= 138/84 & she denies CP, palpit, dizzy, SOB, edema; she [facility]...    Hypothyroid> on Levothy100; she is clinically & biochemically euthyroid; labs 5/17 showed TSH= 1.63, continue same...    Elev FBS> Labs 7/15 showed FBS=116 & she is advised in low carb diet... Labs 5/17 showed FBS=122 & we reviewed low carb requirements.    GI- Divertics, Hems> followed by DrKaplan & last colon was 5/13- scat divertics only & f/u planned 1yrs...    GU- OB> prev eval by DrOttelin & symptoms improved on Myrbetriq; she has since stopped the med & decr fluid intake w/ same result & denies "gotta go" symptoms but has [facility]...    DJD> followed by DrWainer on OTC analgesics as needed; 7/15 c/o some LBP & seeing a chiropractor w/ relief from his adjustments...    Osteopenia> on Calcium, Mag, MVI, VitD1000; BMDs per Gyn in past (?last 2009 w/ lowest Tscore -2.0) & she is INTOL to Bisphos rx; needs f/u BMD (asked to sched here, at GYN, or at Radiology center)...    Anxiety/Depression> on Lorazepam 1mg  1/2 to 1 tab tid; her Gyn  tried Zoloft but she states intol; we wrote for CELEXA20 but she's not sure she wants additional meds; encouraged to incr Lorazepam 1/2 during the day.    Hx skin cancer> aware, she is encouraged to f/u w/ Derm yearly... EXAM shows Afeb, VSS, O2sat=98% on RA;  Wt=149# stable;  HEENT- neg, malampatti 1;  Chest- clear w/o w/r/r;  Heart- RR w/o m/r/g;  Abd- soft, nontender, neg;  Ext- neg w/o c/c/e;  Neuro- anxious, otherw neg 7 no focal neuro deficits...   CXR 06/17/15>  Norm heart size, clear lungs, NAD...   LABS 06/17/15>  FLP- at goals on diet+Fish Oil;  Chems- wnl x FBS=122;  CBC- wnl;  TSH=1.63;  VitD=53... IMP/PLAN>>  Ashley [name] is stable medically but under a lot of stress and sl depressed;  Discussed w/ pt & daughter her options for medication adjustment &/or counseling;  Rec to try CELEXA20 & she will consider this and the counseling; otherw she can incr the Lorazepam & take 1/2 tab Bid during the daytime as needed...           Problem List:     HYPERTENSION, Borderline >> prev on Amlodipine 5mg /d- pt stopped on her own. Hx of CHEST PAIN, ATYPICAL (ICD-786.59) - on ASA 81mg /d... ~  NuclearStressTest 3/02 was normal w/o scar or ischemia, EF=81%. ~  ABI's 12/07 were normal (c/o pain & numbness in legs). ~  CXR 4/13 showed normal heart size, clear lungs, wnl... ~  4/13:  She stopped Amlod5 on her own- denies CP, palpit, dyspnea, syncope, edema, etc... ~  7/14: on Amlod5, ASA81; BP= 124/82 & she denies CP, palpit, dizzy, SOB, edema ~  7/15: on Amlod5, ASA81; BP= 120/78 & she denies CP, palpit, dizzy, SOB, edema; [facility].  ~  5/16: on Amlod5, ASA81; BP= 132/78 & she remains asymptomatic & active...  HYPOTHYROIDISM (ICD-244.9) - currently on SYNTHROID 12600mcg/d she doesn't want generic...  ~  on Synthroid Rx x yrs w/ TSH ~14 in 1983 & med started by DrESL. ~  Thyroid Ultrasound 3/10 showed = sl goiter, no nodules seen. ~  Labs & Synthroid dose have been adjusted slightly over  the yrs... ~  Labs 4/12 on Synth112 showed TSH= 0.30... rec sl decr to 18400mcg/d. ~  Labs 4/13 on Synth100 showed TSH= 1.33... Continue same. 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 f/u planned 4428yrs. ~  f/u colonoscopy 5/13 by DrKaplan showed scat divertics, otherw neg & f/u planned in 10 yrs...  DEGENERATIVE ARTHRITIS (ICD-715.90) - she uses OTC anti-inflamm meds Prn. ~  4/12: c/o right shoulder pain & decr ROM, advised to get eval by Ortho==>DrWainer, Dx rotator cuff tendonitis vs part tear; given shot, PT, Mobic15, & consider MRI. ~  4/13:  She reports improved, no problem...  OSTEOPOROSIS (ICD-733.00) - prev Actonel was stopped 9/09; taking Ca++/ Vits/ Vit D... ~  BMD 12/07 showed TScores -0.8 (hips) to -2.0 (spine)... sl improved from 2005 and before. ~  pt states VitD level was OK per DrMcPhail... ~  [facility] showed TScores -1.3 R FemNeck & -2.0 in Spine... she has stopped the Bisphos therapy due to dental problems and feels she is improved off this med... ~  4/12:  She tells me that GYN continues to follow her BMDs & manages this prob for her; she refuses all bisphos therapies... ~  7/15: she is in need of f/u BMD but declines at this  time... ~  5/16:  Her VitD level = 61...  FATIGUE (ICD-780.79)  ANXIETY (ICD-300.00) - on LORAZEPAM 1mg  as needed... usually takes 1 Qhs... ~  3/10:  she relates a story where her brother developed DM and later died from pancreatic cancer... she likes to get copies of all her lab work and noted her BS was 116 & she thinks that she is  diabetic and may also have pancreatic cancer causing this... we discussed this in detail & I tried to reassure her--- we will check BS=109/ A1c=5.8/ Abd Ultrasound= WNL.Marland Kitchen. ~  4/12:  Under a lot of stress w/ daughter's divorce> she wants to incr the LORAZEPAM to 1mg  tabs- 1/2 to 1 tab Tid Prn... ~  4/13:  She notes that stress is diminished but still taking ativan 1mg  Qhs for sleep...  SKIN CANCER, HX OF (ICD-V10.83) - DrHall removed a squamous cell lesion from chest wall... SHINGLES - she had a bout of shingles Oct08 on her left hip area, treated at the ER w/ cortisone shot and Famvir (she refused Pred tablets due to her osteopenia)...  HEALTH MAINTENANCE -  ~  FLP 7/15 showed TChol 195, TG 85, HDL 86, LDL 92... on diet alone. ~  GYN= [facility] now & she saw Debbora Dus for PAP etc... She needs f/u BMD but declines at this time... ~  Urology, DrOttelin for overactive bladder eval & Rx w/ Myrbetriq... ~  Immuniz:  She gets the yearly seasonal Flu vaccines...   Past Surgical History  Procedure Laterality Date  . Colonoscopy      Patient's Medications  >>  MEDS as of  06/17/15...   Previous Medications   AMLODIPINE (NORVASC) 5 MG TABLET    TAKE 1 TABLET BY MOUTH ONCE DAILY   ASPIRIN 81 MG TABLET    Take 81 mg by mouth daily.     CALCIUM CARBONATE-VITAMIN D (CALTRATE 600+D) 600-400 MG-UNIT PER TABLET    Take 1 tablet by mouth daily.    CHOLECALCIFEROL (VITAMIN D) 1000 UNITS CAPSULE    Take 1,000 Units by mouth daily.     FISH OIL-OMEGA-3 FATTY ACIDS 1000 MG CAPSULE    Take 1 g by mouth daily.   LORAZEPAM (ATIVAN) 1 MG TABLET    TAKE 1/2 TO 1 TABLET BY MOUTH THREE TIMES DAILY AS NEEDED   MAGNESIUM OXIDE (MAG-200 PO)    Take by mouth 3 (three) times a week.   MULTIPLE VITAMIN (MULTIVITAMIN) TABLET    Take 1 tablet by mouth daily. Take one Women's MVI Daily   SYNTHROID 100 MCG TABLET    Take 1 tablet by mouth once a day  Modified Medications   No medications on file  Discontinued  Medications   No medications on file    Allergies  Allergen Reactions  . Alendronate Sodium     REACTION: pt states "blisters in my mouth"  . Latex     sensitive  . Penicillins     REACTION: fatigue/malaise, "feels extremely bad"  . Raloxifene     REACTION: pt states "it dried me out"  . Risedronate Sodium     REACTION: pt states "teeth problems"    Current Medications, Allergies, Past Medical History, Past Surgical History, Family History, and Social History were reviewed in [employer] record.   Review of Systems        See HPI - all other systems neg except as noted...       The patient c/o anxiety, weak  spells, & pain in the right shoulder;  She denies anorexia, fever, weight loss, weight gain, vision loss, decreased hearing, hoarseness, chest pain, syncope, dyspnea on exertion, peripheral edema, prolonged cough, headaches, hemoptysis, abdominal pain, melena, hematochezia, severe indigestion/heartburn, hematuria, incontinence, suspicious skin lesions, transient blindness, difficulty walking, depression, unusual weight change, abnormal bleeding, enlarged lymph nodes, and angioedema.     Objective:   Physical Exam      WD, WN, 75 y/o WF in NAD... GENERAL:  Alert & oriented; pleasant & cooperative... HEENT:  [place]/AT, EOM-wnl, PERRLA, EACs-clear, TMs-wnl, NOSE-clear, THROAT-clear & wnl. NECK:  Supple w/ full ROM; no JVD; normal carotid impulses w/o bruits; no thyromegaly or nodules palpated; no lymphadenopathy. CHEST:  Clear to P & A; without wheezes/ rales/ or rhonchi. HEART:  Regular Rhythm; without murmurs/ rubs/ or gallops. ABDOMEN:  Soft & nontender; normal bowel sounds; no organomegaly or masses detected; +left fem bruit. EXT: without deformities, mild arthritic changes & decr ROM right shoulder; no varicose veins/ venous insuffic/ or edema. NEURO:  CN's intact;  no focal neuro deficits... DERM:  No lesions noted; no rash etc...  RADIOLOGY DATA:   Reviewed in the EPIC EMR & discussed w/ the patient...  LABORATORY DATA:  Reviewed in the EPIC EMR & discussed w/ the patient...   Assessment & Plan:    HBP>  Controlled on her Amlod5...  HYPOTHYROIDISM>  She is clinically euthyroid on Synthroid 138mcg/d; TSH is now WNL, continue same...  Elev FBS>  FBS 7/15 = 116 & 122  In May2017; we discussed low carb diet...  DIVERTICS>  Follow up colon done 5/13 & showed just scat divertics...  Overactive Bladder>  Dx by DrOttelin, her voiding symptoms were improved on Myrbetiq=> stopped on her own & doing satis w/ decr fluids she says... 5/16> she's had several UTIs and may want to f/u w/ Urology...  OSTEOARTHRITIS>  She has some right shoulder pain & decr ROM (can't abduct);  She saw DrWainer & he Rx w/ shot + Medrol, etc,,,  OSTEOPENIA>  She is on Caltrate/ MVI/ Vit D & she is followed by her GYN;  Advised to have f/u BMD on sched so they can address the need for medication if nec...  ANXIETY>   On Lorazepam to 1mg  tabs- 1/2 to 1 tab  Tid Prn & she tells me she is using one qhs...   Patient's Medications  New Prescriptions   CITALOPRAM (CELEXA) 20 MG TABLET    Take 1 tablet (20 mg total) by mouth daily.  Previous Medications   AMLODIPINE (NORVASC) 5 MG TABLET    TAKE 1 TABLET BY MOUTH ONCE DAILY   ASPIRIN 81 MG TABLET    Take 81 mg by mouth daily.     CALCIUM CARBONATE-VITAMIN D (CALTRATE 600+D) 600-400 MG-UNIT PER TABLET    Take 1 tablet by mouth daily.    CHOLECALCIFEROL (VITAMIN D) 1000 UNITS CAPSULE    Take 1,000 Units by mouth daily.     FISH OIL-OMEGA-3 FATTY ACIDS 1000 MG CAPSULE    Take 1 g by mouth daily.   LORAZEPAM (ATIVAN) 1 MG TABLET    Take 0.5-1 tablets (0.5-1 mg total) by mouth 3 (three) times daily as needed.   MAGNESIUM OXIDE (MAG-200 PO)    Take 250 mg by mouth daily.    MULTIPLE VITAMIN (MULTIVITAMIN) TABLET    Take 1 tablet by mouth daily. Take one Women's MVI Daily   SYNTHROID 100 MCG TABLET    Take 1 tablet by  mouth once a day  Modified Medications   No medications on file  Discontinued Medications   No medications on file

## 2015-06-19 ENCOUNTER — Telehealth: Payer: Self-pay | Admitting: Pulmonary Disease

## 2015-06-19 ENCOUNTER — Encounter: Payer: Self-pay | Admitting: Pulmonary Disease

## 2015-06-19 NOTE — Progress Notes (Signed)
Quick Note:  Please see phone note from 06/19/15. Will sign off. ______ 

## 2015-06-19 NOTE — Telephone Encounter (Signed)
Per 5.5.17 phone note, pt is aware of her lab and cxr results Will sign off

## 2015-06-19 NOTE — Telephone Encounter (Signed)
E-mail chain from pt'[name]: Message     Thank you so much! If you can do so today, that would be extremely helpful. Conception OmsAndrea [name]    ----- Message -----    From: CMA Ernest HaberLindsay C L    Sent: 06/19/2015 10:47 AM EDT    To: Pilar JarvisLinda [name] Yingst    Subject: RE: Visit Follow-Up Question        Ashley LushAndrea,        Dr. Kriste BasqueNadel has resulted this and one of us clinical staff members will give her a call.        ----- Message -----     From: Silvano BilisUDD,Ashley [name]     Sent: 06/19/2015 10:29 AM EDT      To: Michele McalpineNADEL,SCOTT M, MD    Subject: Visit Follow-Up Question        Hello this is [name],  [name],[name]  I see that my moms x-ray results are listed on her chart.  I believe she will be very concerned about the reference to COPD. I need to have a nurse or Dr Kriste BasqueNadel call her for an interpretation of the x-ray results.  Her cell phone number is [phone].         Thank you so much    Ashley [name]    [phone]    Results per SN: Result Notes       Notes Recorded by Michele McalpineScott M Nadel, MD on 06/19/2015 at 9:46 AM Please notify patient>   CXR shows normal heart size, clear lungs, no acute changes...   Notes Recorded by Michele McalpineScott M Nadel, MD on 06/19/2015 at 9:50 AM Please notify patient>  LABS look OK- continue same meds... FLP- all parameters are at goals on diet + fish oil... Chems, CBC, Thyroid, VitD are all essentially wnl... FBS=122 is borderline elev 7 needs low carb diet, no sweets...  Called spoke with patient and discussed the results listed above.  Pt voiced her understanding and denied any questions/concerns at this time.  She does request these results be mailed to her - home address verified.  Emailed daughter to let her know patient is aware of results.  Results printed and placed in the mail for pt. Nothing further needed; will sign off

## 2015-06-19 NOTE — Progress Notes (Signed)
Quick Note:  Please see phone note from 06/19/15. Will sign off. ______

## 2015-06-23 ENCOUNTER — Other Ambulatory Visit: Payer: Self-pay | Admitting: Pulmonary Disease

## 2015-06-30 ENCOUNTER — Telehealth: Payer: Self-pay | Admitting: Pulmonary Disease

## 2015-06-30 NOTE — Telephone Encounter (Signed)
Pt states that she received a copy of her CXR results that were mailed to her and states that the results on paper do not match those given to her verbally.  Pt wants to know what "heart shadow" means. States that it has never been mentioned to her that she has COPD but the results state otherwise. Pt very adamant in having this explained to her and does not want to wait for SN to return in 2 weeks - aware that I will send the Dr on Call, Dr Delton CoombesByrum to see if he can better explain these results. Please advise Dr Delton CoombesByrum. Thanks.   Notes Recorded by Michele McalpineScott M Nadel, MD on 06/19/2015 at 9:46 AM Please notify patient>  CXR shows normal heart size, clear lungs, no acute changes.Marland Kitchen..Marland Kitchen

## 2015-06-30 NOTE — Telephone Encounter (Signed)
I explained the results to the patient. Explained that CXR is not a good way to dx COPD, that I agree with dr Jodelle GreenNadel's interpretation of the film. She understands.

## 2015-09-24 ENCOUNTER — Other Ambulatory Visit: Payer: Self-pay | Admitting: Pulmonary Disease

## 2015-10-22 ENCOUNTER — Other Ambulatory Visit: Payer: Self-pay | Admitting: Pulmonary Disease

## 2015-11-04 MED FILL — NYSTATIN-TRIAMCINOLONE OINT: 100000-0.1 | 30 days supply | Qty: 30 | Fill #0

## 2015-12-14 ENCOUNTER — Other Ambulatory Visit: Payer: Self-pay | Admitting: Pulmonary Disease

## 2015-12-19 ENCOUNTER — Other Ambulatory Visit: Payer: Self-pay | Admitting: Pulmonary Disease

## 2015-12-22 ENCOUNTER — Other Ambulatory Visit: Payer: Self-pay | Admitting: Pulmonary Disease

## 2015-12-25 ENCOUNTER — Telehealth: Payer: Self-pay | Admitting: Pulmonary Disease

## 2015-12-25 MED ORDER — AMLODIPINE BESYLATE 5 MG PO TABS: 5.0000 mg | ORAL_TABLET | Freq: Every day | ORAL | 1 refills | Status: DC

## 2015-12-25 MED ORDER — CITALOPRAM HYDROBROMIDE 20 MG PO TABS: 20.0000 mg | ORAL_TABLET | Freq: Every day | ORAL | 1 refills | Status: DC

## 2015-12-25 NOTE — Telephone Encounter (Signed)
I have refilled the celexa and amlodipine  Please advise if okay to refill ativan  Pt okay to wait through the wkend  Last given ativan 1 mg # 90 tablets with 5 rf on 03/05/15  Rf has expired  Thanks!

## 2015-12-28 MED ORDER — LORAZEPAM 1 MG PO TABS: 0.5000 mg | ORAL_TABLET | Freq: Three times a day (TID) | ORAL | 5 refills | Status: DC | PRN

## 2015-12-28 NOTE — Telephone Encounter (Signed)
Refill of the ativan has been called to the pts pharmacy. Nothing further is needed.

## 2016-01-14 ENCOUNTER — Other Ambulatory Visit: Payer: Self-pay | Admitting: Pulmonary Disease

## 2016-01-28 ENCOUNTER — Encounter (HOSPITAL_COMMUNITY): Payer: Self-pay | Admitting: Emergency Medicine

## 2016-01-28 ENCOUNTER — Telehealth: Payer: Self-pay | Admitting: Pulmonary Disease

## 2016-01-28 ENCOUNTER — Emergency Department (HOSPITAL_COMMUNITY)
Admission: EM | Admit: 2016-01-28 | Discharge: 2016-01-28 | Disposition: A | Discharge status: Home / Self Care | Payer: Medicare Other | Attending: Emergency Medicine | Admitting: Emergency Medicine

## 2016-01-28 DIAGNOSIS — S0510XA Contusion of eyeball and orbital tissues, unspecified eye, initial encounter: Secondary | ICD-10-CM | POA: Insufficient documentation

## 2016-01-28 DIAGNOSIS — I10 Essential (primary) hypertension: Secondary | ICD-10-CM | POA: Insufficient documentation

## 2016-01-28 DIAGNOSIS — Y999 Unspecified external cause status: Secondary | ICD-10-CM | POA: Insufficient documentation

## 2016-01-28 DIAGNOSIS — E039 Hypothyroidism, unspecified: Secondary | ICD-10-CM | POA: Diagnosis not present

## 2016-01-28 DIAGNOSIS — Y929 Unspecified place or not applicable: Secondary | ICD-10-CM | POA: Insufficient documentation

## 2016-01-28 DIAGNOSIS — W1830XA Fall on same level, unspecified, initial encounter: Secondary | ICD-10-CM | POA: Insufficient documentation

## 2016-01-28 DIAGNOSIS — S0083XA Contusion of other part of head, initial encounter: Secondary | ICD-10-CM

## 2016-01-28 DIAGNOSIS — S0993XA Unspecified injury of face, initial encounter: Secondary | ICD-10-CM | POA: Diagnosis present

## 2016-01-28 DIAGNOSIS — Z91040 Latex allergy status: Secondary | ICD-10-CM | POA: Insufficient documentation

## 2016-01-28 DIAGNOSIS — Y9389 Activity, other specified: Secondary | ICD-10-CM | POA: Diagnosis not present

## 2016-01-28 DIAGNOSIS — Z7982 Long term (current) use of aspirin: Secondary | ICD-10-CM | POA: Diagnosis not present

## 2016-01-28 NOTE — ED Notes (Signed)
Pt denies being on blood thinners.

## 2016-01-28 NOTE — Telephone Encounter (Signed)
Called by [name] who relates that she feel today sustaining a laceration above the eyebrow. Not currently bleeding. Recommended that she go to the Emergency Department for further evaluation and possible repair.

## 2016-01-28 NOTE — ED Notes (Signed)
Dermabond placed at bedside. 

## 2016-01-28 NOTE — ED Triage Notes (Signed)
Pt states she was bringing in the groceries and lost her balance and fell landing on her right side  Pt states she hit her face  Denies LOC  Pt has a small laceration to the right eyebrow area and bruising to the right side of her face around her eye area

## 2016-01-28 NOTE — ED Provider Notes (Signed)
[facility]-EMERGENCY DEPT Provider Note   CSN: [id number] Arrival date & time: 01/28/16  1940  By signing my name below, I, Sonum Patel, attest that this documentation has been prepared under the direction and in the presence of Raeford RazorStephen Lien Lyman, MD. Electronically Signed: Sonum Patel, Neurosurgeoncribe. 01/28/16. 8:45 PM.  History   Chief Complaint Chief Complaint  Patient presents with  . Fall  . Facial Injury    The history is provided by the patient. No language interpreter was used.     HPI Comments: Ashley [name] is a 75 y.o. female who presents to the Emergency Department complaining of a fall that occurred PTA. Patient states she was carrying groceries into her home when she lost her balance and fell forwards, striking her right face on the ground. She reports a bruise to her right cheek and below the eye area and a laceration to the right eyebrow. She denies LOC. She denies neck pain, back pain, vision changes, nausea. She denies anti-coagulant use.   Past Medical History:  Diagnosis Date  . Anxiety state, unspecified   . Diverticulosis of colon (without mention of hemorrhage)   . Osteoporosis, unspecified   . Other chest pain   . Other malaise and fatigue   . Personal history of other malignant neoplasm of skin   . Unspecified hemorrhoids without mention of complication   . Unspecified hypothyroidism     Patient Active Problem List   Diagnosis Date Noted  . Essential hypertension 06/17/2015  . H/O mixed hyperlipidemia 06/17/2015  . Dysthymia 06/17/2015  . Vitamin D deficiency 06/17/2014  . Osteopenia 08/19/2013  . ELEVATED BLOOD PRESSURE 05/03/2010  . Diverticulosis of large intestine 05/04/2007  . CHEST PAIN, ATYPICAL 05/04/2007  . SKIN CANCER, HX OF 05/04/2007  . Hypothyroidism 05/03/2007  . Anxiety state 05/03/2007  . HEMORRHOIDS 05/03/2007  . FATIGUE 05/03/2007    Past Surgical History:  Procedure Laterality Date  . COLONOSCOPY      OB History    No data available      Home Medications    Prior to Admission medications   Medication Sig Start Date End Date Taking? Authorizing Provider  amLODipine (NORVASC) 5 MG tablet Take 1 tablet (5 mg total) by mouth daily. 12/25/15   Nyoka CowdenMichael B Wert, MD  aspirin 81 MG tablet Take 81 mg by mouth daily.      Historical Provider, MD  Calcium Carbonate-Vitamin D (CALTRATE 600+D) 600-400 MG-UNIT per tablet Take 1 tablet by mouth daily.     Historical Provider, MD  Cholecalciferol (VITAMIN D) 1000 UNITS capsule Take 1,000 Units by mouth daily.      Historical Provider, MD  citalopram (CELEXA) 20 MG tablet Take 1 tablet (20 mg total) by mouth daily. 12/25/15   Nyoka CowdenMichael B Wert, MD  fish oil-omega-3 fatty acids 1000 MG capsule Take 1 g by mouth daily.    Historical Provider, MD  LORazepam (ATIVAN) 1 MG tablet Take 0.5-1 tablets (0.5-1 mg total) by mouth 3 (three) times daily as needed. 12/28/15   Michele McalpineScott M Nadel, MD  Magnesium Oxide (MAG-200 PO) Take 250 mg by mouth daily.     Historical Provider, MD  Multiple Vitamin (MULTIVITAMIN) tablet Take 1 tablet by mouth daily. Take one Women's MVI Daily    Historical Provider, MD  SYNTHROID 100 MCG tablet TAKE 1 TABLET BY MOUTH ONCE A DAY 01/14/16   Michele McalpineScott M Nadel, MD    Family History Family History  Problem Relation Age of Onset  . Ovarian cancer  Mother   . Pancreatic cancer Brother   . Diabetes      Social History Social History  Substance Use Topics  . Smoking status: Never Smoker  . Smokeless tobacco: Never Used  . Alcohol use No     Allergies   Alendronate sodium; Latex; Penicillins; Raloxifene; and Risedronate sodium   Review of Systems Review of Systems  A complete 10 system review of systems was obtained and all systems are negative except as noted in the HPI and PMH.   Physical Exam Updated Vital Signs BP 142/82 (BP Location: Right Arm)   Pulse 80   Temp 97.8 F (36.6 C) (Oral)   Resp 18   Ht 5\' 7"  (1.702 m)   Wt 140 lb (63.5 kg)   SpO2 95%   BMI  21.93 kg/m   Physical Exam  Constitutional: She is oriented to person, place, and time. She appears well-developed and well-nourished. No distress.  HENT:  Head: Normocephalic.  Abrasion near right eyebrow, no active bleeding. Mild tenderness and ecchymosis over inferior orbital rim  Eyes: EOM are normal.  Neck: Normal range of motion.  No midline cervical spine tenderness.  Cardiovascular: Normal rate, regular rhythm and normal heart sounds.   Pulmonary/Chest: Effort normal and breath sounds normal.  Abdominal: Soft. She exhibits no distension. There is no tenderness.  Musculoskeletal: Normal range of motion.  Neurological: She is alert and oriented to person, place, and time. She displays normal reflexes. No cranial nerve deficit or sensory deficit. She exhibits normal muscle tone. Coordination normal.  Skin: Skin is warm and dry.  Psychiatric: She has a normal mood and affect. Judgment normal.  Nursing note and vitals reviewed.    ED Treatments / Results  DIAGNOSTIC STUDIES: Oxygen Saturation is 95% on RA, adequate by my interpretation.    COORDINATION OF CARE: 8:45 PM Discussed treatment plan with pt at bedside and pt agreed to plan.    Labs (all labs ordered are listed, but only abnormal results are displayed) Labs Reviewed - No data to display  EKG  EKG Interpretation None       Radiology No results found.   No results found.  Procedures Procedures (including critical care time)  Medications Ordered in ED Medications - No data to display   Initial Impression / Assessment and Plan / ED Course  I have reviewed the triage vital signs and the nursing notes.  Pertinent labs & imaging results that were available during my care of the patient were reviewed by me and considered in my medical decision making (see chart for details).  Clinical Course      Final Clinical Impressions(s) / ED Diagnoses   Final diagnoses:  Contusion of face, initial encounter      New Prescriptions New Prescriptions   No medications on file    I personally preformed the services scribed in my presence. The recorded information has been reviewed is accurate. Raeford RazorStephen Aileena Iglesia, MD.    Raeford RazorStephen Arionna Hoggard, MD 02/03/16 [phone]

## 2016-02-04 ENCOUNTER — Telehealth: Payer: Self-pay | Admitting: Pulmonary Disease

## 2016-02-04 NOTE — Telephone Encounter (Signed)
Spoke with pt. She received a letter from her insurance company about SN being an out of network provider. Pt has several questions about this letter. Advised her that she would need to contact her insurance company to have them clarify this letter to her. She verbalized understanding. Nothing further was needed.

## 2016-06-16 ENCOUNTER — Ambulatory Visit (INDEPENDENT_AMBULATORY_CARE_PROVIDER_SITE_OTHER): Payer: Medicare Other | Admitting: Pulmonary Disease

## 2016-06-16 ENCOUNTER — Encounter: Payer: Self-pay | Admitting: Pulmonary Disease

## 2016-06-16 VITALS — BP 122/84 | HR 55 | Temp 96.9°F | Ht 67.0 in | Wt 148.1 lb

## 2016-06-16 DIAGNOSIS — M8589 Other specified disorders of bone density and structure, multiple sites: Secondary | ICD-10-CM

## 2016-06-16 DIAGNOSIS — I10 Essential (primary) hypertension: Secondary | ICD-10-CM

## 2016-06-16 DIAGNOSIS — F341 Dysthymic disorder: Secondary | ICD-10-CM

## 2016-06-16 DIAGNOSIS — Z8639 Personal history of other endocrine, nutritional and metabolic disease: Secondary | ICD-10-CM

## 2016-06-16 DIAGNOSIS — E559 Vitamin D deficiency, unspecified: Secondary | ICD-10-CM

## 2016-06-16 DIAGNOSIS — R7301 Impaired fasting glucose: Secondary | ICD-10-CM

## 2016-06-16 DIAGNOSIS — E039 Hypothyroidism, unspecified: Secondary | ICD-10-CM

## 2016-06-16 DIAGNOSIS — K5730 Diverticulosis of large intestine without perforation or abscess without bleeding: Secondary | ICD-10-CM

## 2016-06-16 MED ORDER — LORAZEPAM 1 MG PO TABS: 0.5000 mg | ORAL_TABLET | Freq: Three times a day (TID) | ORAL | 5 refills | Status: DC | PRN

## 2016-06-16 NOTE — Progress Notes (Signed)
Subjective:    Patient ID: Ashley [name], female    DOB: Jul 05, 1940, 76 y.o.   MRN: [id number]  HPI 76 y/o WF here for a follow up visit...  She has mult medical problems including:  Hypothyroidism;  Divertics/ Hems;  DJD/ Osteopenia;  Anxiety... ~  SEE PREV EPIC NOTES FOR OLDER DATA >>   ~  May 27, 2010:  2 yr ROV and she has been under a lot of stress w/ daughter's divorce "it's taking a toll on me" ~  May 31, 2011:  Yearly ROV & Ashley [name] reports that she was Dx w/ overactive bladder by DrOttelin & started on MyrbetriqSR 25mg /d  CXR 4/13 showed normal heart size, clear lungs, WNL.Marland Kitchen.Marland Kitchen.  LABS 4/13:  FLP- ok on diet;  Chems- ok w/ BS107;  CBC- wnl;  TSH=1.33, VitD=67    LABS 7/14:  FLP- at goals on diet alone;  Chems- ok x BS=110;  CBC- wnl;  TSH=0.94 on Levothy100...  ~  August 19, 2013:  7858yr ROV & Ashley [name] continues to do well- no new complaints or concerns, on same 3 meds below; she has seen a chiropractor recently w/ LBP & feet tingling- improved w/ the adjustments... We reviewed the following medical problems during today's office visit >>     Hx HBP, AtypCP> on Amlod5, ASA81; BP= 120/78 & she denies CP, palpit, dizzy, SOB, edema; [facility]...    Hypothyroid> on Levothy100; she is clinically & biochemically euthyroid; labs 7/15 showed TSH= 0.65    Elev FBS> Labs 7/15 showed FBS=116 & she is advised in low carb diet...    GI- Divertics, Hems> followed by DrKaplan & last colon was 5/13 by DrKaplan- scat divertics only & f/u planned 1641yrs...    GU- OB> prev eval by DrOttelin & symptoms improved on Myrbetriq; she has since stopped the med & decr fluid intake w/ same result & denies "gotta go" symptoms...    DJD> followed by DrWainer on OTC analgesics as needed; 7/15 c/o some LBP & seeing a chiropractor w/ relief from his adjustments...    Osteopenia> on Calcium, Mag, MVI, VitD1000; BMDs per Gyn in past (?last 2009 w/ lowest Tscore -2.0) & she is INTOL to Bisphos rx; needs f/u BMD  (asked to sched here, at GYN, or at Radiology center)...    Anxiety> on Lorazepam 1mg  prn...    Hx skin cancer> aware, she is encouraged to f/u w/ Derm yearly... We reviewed prob list, meds, xrays and labs> see below for updates >>   CXR 7/15 showed norm heart size, clear lungs, NAD.Marland Kitchen.Marland Kitchen.   EKG 7/15 showed NSR, rate67, wnl, NAD...  LABS 7/15:  FLP- wnl on diet alone;  Chems- ok x BS=116;  CBC- wnl;  TSH=0.65;  VitD=67...  ~  Jun 17, 2014:  10mo ROV & Ashley [name] indicates that she is doing very well overall- notes that she's had several UTIs treated [facility] w/ antibiotics & wonders when she should f/u w/ DrOttelin to see if anything else needs to be done- suggested to call urology for their opinion...  We reviewed the following medical problems during today's office visit >>     Hx HBP, AtypCP> on Amlod5, ASA81; BP= 132/78 & she denies CP, palpit, dizzy, SOB, edema; [facility]...    Hypothyroid> on Levothy100; she is clinically & biochemically euthyroid; labs 5/16 showed TSH= 0.60, continue same...    Elev FBS> Labs 7/15 showed FBS=116 & she is  advised in low carb diet... Labs 5/16- BMet wasn't done.    GI- Divertics, Hems> followed by DrKaplan & last colon was 5/13- scat divertics only & f/u planned 5yrs...    GU- OB> prev eval by DrOttelin & symptoms improved on Myrbetriq; she has since stopped the med & decr fluid intake w/ same result & denies "gotta go" symptoms but has had several UT[facility]...    DJD> followed by DrWainer on OTC analgesics as needed; 7/15 c/o some LBP & seeing a chiropractor w/ relief from his adjustments...    Osteopenia> on Calcium, Mag, MVI, VitD1000; BMDs per Gyn in past (?last 2009 w/ lowest Tscore -2.0) & she is INTOL to Bisphos rx; needs f/u BMD (asked to sched here, at GYN, or at Radiology center)...    Anxiety> on Lorazepam 1mg  prn...    Hx skin cancer> aware, she is encouraged to f/u w/ Derm yearly... We reviewed prob list, meds,  xrays and labs> see below for updates >> We gave her the PREVNAR-13 today...   LABS 5/16:  FLP- at goals on diet alone;  Chems- ok;  CBC- wnl;  TSH=0.60;  VitD=61...   ~  Jun 17, 2015:  30yr ROV & medical follow up visit- Ashley [name] is here w/ her daughter; pt under a lot of stress w/ husband's recent illness (stroke, PAF) and her brother is ill in [place]; she feels she's depressed and notes that her GYN tried Zoloft ?dose but pt stopped it 7 says "intol" w/ shaking, elev HR, etc "it's my age" she says;  She is using Lorazepam 1mg Qhs but not taking it during the day;  We discussed using 1/2 Lorazepam prn days and try a diff antidepressant like CELEXA 20mg  daily (she's not sure that she wants it but requests Rx & says she'll start it when she's ready);  We discussed counseling 7 offered appt at [facility] w/ Judithe Modest...     Hx HBP, AtypCP> on Amlod5, [id number]; BP= 138/84 & she denies CP, palpit, dizzy, SOB, edema; she [facility]...    Hypothyroid> on Levothy100; she is clinically & biochemically euthyroid; labs 5/17 showed TSH= 1.63, continue same...    Elev FBS> Labs 7/15 showed FBS=116 & she is advised in low carb diet... Labs 5/17 showed FBS=122 & we reviewed low carb requirements.    GI- Divertics, Hems> followed by DrKaplan & last colon was 5/13- scat divertics only & f/u planned 53yrs...    GU- OB> prev eval by DrOttelin & symptoms improved on Myrbetriq; she has since stopped the med & decr fluid intake w/ same result & denies "gotta go" symptoms but has had several UT[facility]...    DJD> followed by DrWainer on OTC analgesics as needed; 7/15 c/o some LBP & seeing a chiropractor w/ relief from his adjustments...    Osteopenia> on Calcium, Mag, MVI, VitD1000; BMDs per Gyn in past (?last 2009 w/ lowest Tscore -2.0) & she is INTOL to Bisphos rx; needs f/u BMD (asked to sched here, at GYN, or at Radiology center)...    Anxiety/Depression> on Lorazepam 1mg  1/2 to 1 tab tid; her Gyn  tried Zoloft but she states intol; we wrote for CELEXA20 but she's not sure she wants additional meds; encouraged to incr Lorazepam 1/2 during the day.    Hx skin cancer> aware, she is encouraged to f/u w/ Derm yearly... EXAM shows Afeb, VSS, O2sat=98% on RA;  Wt=149# stable;  HEENT- neg, malampatti 1;  Chest- clear w/o w/r/r;  Heart- RR w/o m/r/g;  Abd- soft, nontender, neg;  Ext- neg w/o c/c/e;  Neuro- anxious, otherw neg 7 no focal neuro deficits...   CXR 06/17/15>  Norm heart size, clear lungs, NAD...   LABS 06/17/15>  FLP- at goals on diet+Fish Oil;  Chems- wnl x FBS=122;  CBC- wnl;  TSH=1.63;  VitD=53... IMP/PLAN>>  Ashley [name] is stable medically but under a lot of stress and sl depressed;  Discussed w/ pt & daughter her options for medication adjustment &/or counseling;  Rec to try CELEXA20 & she will consider this and the counseling; otherw she can incr the Lorazepam & take 1/2 tab Bid during the daytime as needed...    ~  Jun 16, 2016:  21yr ROV & general medical follow up visit>              Problem List:     HYPERTENSION, Borderline >> prev on Amlodipine 5mg /d- pt stopped on her own. Hx of CHEST PAIN, ATYPICAL (ICD-786.59) - on ASA 81mg /d... ~  NuclearStressTest 3/02 was normal w/o scar or ischemia, EF=81%. ~  ABI's 12/07 were normal (c/o pain & numbness in legs). ~  CXR 4/13 showed normal heart size, clear lungs, wnl... ~  4/13:  She stopped Amlod5 on her own- denies CP, palpit, dyspnea, syncope, edema, etc... ~  7/14: on Amlod5, ASA81; BP= 124/82 & she denies CP, palpit, dizzy, SOB, edema ~  7/15: on Amlod5, ASA81; BP= 120/78 & she denies CP, palpit, dizzy, SOB, edema; [facility].  ~  5/16: on Amlod5, ASA81; BP= 132/78 & she remains asymptomatic & active...  HYPOTHYROIDISM (ICD-244.9) - currently on SYNTHROID 160mcg/d she doesn't want generic...  ~  on Synthroid Rx x yrs w/ TSH ~14 in 1983 & med started by DrESL. ~  Thyroid Ultrasound 3/10 showed = sl goiter, no  nodules seen. ~  Labs & Synthroid dose have been adjusted slightly over the yrs... ~  Labs 4/12 on Synth112 showed TSH= 0.30... rec sl decr to 122mcg/d. ~  Labs 4/13 on Synth100 showed TSH= 1.33... Continue same. 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 f/u planned 32yrs. ~  f/u colonoscopy 5/13 by DrKaplan showed scat divertics, otherw neg & f/u planned in 10 yrs...  DEGENERATIVE ARTHRITIS (ICD-715.90) - she uses OTC anti-inflamm meds Prn. ~  4/12: c/o right shoulder pain & decr ROM, advised to get eval by Ortho==>DrWainer, Dx rotator cuff tendonitis vs part tear; given shot, PT, Mobic15, & consider MRI. ~  4/13:  She reports improved, no problem...  OSTEOPOROSIS (ICD-733.00) - prev Actonel was stopped 9/09; taking Ca++/ Vits/ Vit D... ~  BMD 12/07 showed TScores -0.8 (hips) to -2.0 (spine)... sl improved from 2005 and before. ~  pt states VitD level was OK per DrMcPhail... ~  repeat BMD 12/09 by [facility] showed TScores -1.3 R FemNeck & -2.0 in Spine... she has stopped the Bisphos therapy due to dental problems and feels she is improved off this med... ~  4/12:  She tells me that GYN continues to follow her BMDs & manages this prob for  her; she refuses all bisphos therapies... ~  7/15: she is in need of f/u BMD but declines at this time... ~  5/16:  Her VitD level = 61...  FATIGUE (ICD-780.79)  ANXIETY (ICD-300.00) - on LORAZEPAM 1mg  as needed... usually takes 1 Qhs... ~  3/10:  she relates a story where her brother developed DM and later died from pancreatic cancer... she likes to get  copies of all her lab work and noted her BS was 116 & she thinks that she is diabetic and may also have pancreatic cancer causing this... we discussed this in detail & I tried to reassure her--- we will check BS=109/ A1c=5.8/ Abd Ultrasound= WNL.Marland Kitchen. ~  4/12:  Under a lot of stress w/ daughter's divorce> she wants to incr the LORAZEPAM to 1mg  tabs- 1/2 to 1 tab Tid Prn... ~  4/13:  She notes that stress is diminished but still taking ativan 1mg  Qhs for sleep...  SKIN CANCER, HX OF (ICD-V10.83) - DrHall removed a squamous cell lesion from chest wall... SHINGLES - she had a bout of shingles Oct08 on her left hip area, treated at the ER w/ cortisone shot and Famvir (she refused Pred tablets due to her osteopenia)...  HEALTH MAINTENANCE -  ~  FLP 7/15 showed TChol 195, TG 85, HDL 86, LDL 92... on diet alone. ~  GYN= [facility] now & she saw Debbora Dus for PAP etc... She needs f/u BMD but declines at this time... ~  Urology, DrOttelin for overactive bladder eval & Rx w/ Myrbetriq... ~  Immuniz:  She gets the yearly seasonal Flu vaccines...   Past Surgical History:  Procedure Laterality Date  . COLONOSCOPY      Patient's Medications  >>  MEDS as of  06/16/16  Previous Medications   AMLODIPINE (NORVASC) 5 MG TABLET    TAKE 1 TABLET BY MOUTH ONCE DAILY   ASPIRIN 81 MG TABLET    Take 81 mg by mouth daily.     CALCIUM CARBONATE-VITAMIN D (CALTRATE 600+D) 600-400 MG-UNIT PER TABLET    Take 1 tablet by mouth daily.    CHOLECALCIFEROL (VITAMIN D) 1000 UNITS CAPSULE    Take 1,000 Units by mouth daily.     FISH OIL-OMEGA-3 FATTY ACIDS 1000 MG CAPSULE    Take 1 g by mouth daily.   LORAZEPAM (ATIVAN) 1 MG TABLET    TAKE 1/2 TO 1 TABLET BY MOUTH THREE TIMES DAILY AS NEEDED   MAGNESIUM OXIDE (MAG-200 PO)    Take by mouth 3 (three) times a week.   MULTIPLE VITAMIN (MULTIVITAMIN) TABLET    Take 1 tablet by mouth daily. Take one Women's MVI Daily   SYNTHROID 100 MCG TABLET    Take 1 tablet by mouth once a day   Modified Medications   No medications on file  Discontinued Medications   No medications on file    Allergies  Allergen Reactions  . Alendronate Sodium     REACTION: pt states "blisters in my mouth"  . Latex     sensitive  . Penicillins Other (See Comments)    REACTION: fatigue/malaise, "feels extremely bad" Did PCN reaction causing immediate rash, facial/tongue/throat swelling, SOB or lightheadedness with hypotension: no Has patient had a PCN reaction causing severe rash involving mucus membranes or skin necrosis: no Has patient had a PCN reaction that required hospitalization : no Has patient had a PCN reaction occurring within the last 10 years: yes If all of the above answers are "NO", then may  proceed with Cephalosporin use. Extremely lethargic  . Raloxifene     REACTION: pt states "it dried me out"  . Risedronate Sodium     REACTION: pt states "teeth problems"    Current Medications, Allergies, Past Medical History, Past Surgical History, Family History, and Social History were reviewed in [employer] record.   Review of Systems        See HPI - all other systems neg except as noted...       The patient c/o anxiety, weak spells, & pain in the right shoulder;  She denies anorexia, fever, weight loss, weight gain, vision loss, decreased hearing, hoarseness, chest pain, syncope, dyspnea on exertion, peripheral edema, prolonged cough, headaches, hemoptysis, abdominal pain, melena, hematochezia, severe indigestion/heartburn, hematuria, incontinence, suspicious skin lesions, transient blindness, difficulty walking, depression, unusual weight change, abnormal bleeding, enlarged lymph nodes, and angioedema.     Objective:   Physical Exam      WD, WN, 76 y/o WF in NAD... GENERAL:  Alert & oriented; pleasant & cooperative... HEENT:  [place]/AT, EOM-wnl, PERRLA, EACs-clear, TMs-wnl, NOSE-clear, THROAT-clear & wnl. NECK:  Supple w/ full ROM; no JVD; normal  carotid impulses w/o bruits; no thyromegaly or nodules palpated; no lymphadenopathy. CHEST:  Clear to P & A; without wheezes/ rales/ or rhonchi. HEART:  Regular Rhythm; without murmurs/ rubs/ or gallops. ABDOMEN:  Soft & nontender; normal bowel sounds; no organomegaly or masses detected; +left fem bruit. EXT: without deformities, mild arthritic changes & decr ROM right shoulder; no varicose veins/ venous insuffic/ or edema. NEURO:  CN's intact;  no focal neuro deficits... DERM:  No lesions noted; no rash etc...  RADIOLOGY DATA:  Reviewed in the EPIC EMR & discussed w/ the patient...  LABORATORY DATA:  Reviewed in the EPIC EMR & discussed w/ the patient...   Assessment & Plan:    HBP>  Controlled on her Amlod5...  HYPOTHYROIDISM>  She is clinically euthyroid on Synthroid 194mcg/d; TSH is now WNL, continue same...  Elev FBS>  FBS 7/15 = 116 & 122  In May2017; we discussed low carb diet...  DIVERTICS>  Follow up colon done 5/13 & showed just scat divertics...  Overactive Bladder>  Dx by DrOttelin, her voiding symptoms were improved on Myrbetiq=> stopped on her own & doing satis w/ decr fluids she says... 5/16> she's had several UTIs and may want to f/u w/ Urology...  OSTEOARTHRITIS>  She has some right shoulder pain & decr ROM (can't abduct);  She saw DrWainer & he Rx w/ shot + Medrol, etc,,,  OSTEOPENIA>  She is on Caltrate/ MVI/ Vit D & she is followed by her GYN;  Advised to have f/u BMD on sched so they can address the need for medication if nec...  ANXIETY>   On Lorazepam to 1mg  tabs- 1/2 to 1 tab  Tid Prn & she tells me she is using one qhs...   Patient's Medications  New Prescriptions   No medications on file  Previous Medications   AMLODIPINE (NORVASC) 5 MG TABLET    Take 1 tablet (5 mg total) by mouth daily.   ASPIRIN 81 MG TABLET    Take 81 mg by mouth at bedtime.    CALCIUM CARBONATE-VITAMIN D (CALTRATE 600+D) 600-400 MG-UNIT PER TABLET    Take 1 tablet by mouth every  evening.    CHOLECALCIFEROL (VITAMIN D) 1000 UNITS CAPSULE    Take 1,000 Units by mouth at bedtime.    CITALOPRAM (CELEXA) 20 MG TABLET  Take 1 tablet (20 mg total) by mouth daily.   FISH OIL-OMEGA-3 FATTY ACIDS 1000 MG CAPSULE    Take 1 g by mouth 3 (three) times a week.    MAGNESIUM OXIDE (MAG-200 PO)    Take 250 mg by mouth daily.    MULTIPLE VITAMINS-MINERALS (ICAPS AREDS 2 PO)    Take 1 tablet by mouth daily.   SYNTHROID 100 MCG TABLET    TAKE 1 TABLET BY MOUTH ONCE A DAY  Modified Medications   Modified Medication Previous Medication   LORAZEPAM (ATIVAN) 1 MG TABLET LORazepam (ATIVAN) 1 MG tablet      Take 0.5-1 tablets (0.5-1 mg total) by mouth 3 (three) times daily as needed.    Take 0.5-1 tablets (0.5-1 mg total) by mouth 3 (three) times daily as needed.  Discontinued Medications   No medications on file

## 2016-06-16 NOTE — Patient Instructions (Signed)
Today we updated your med list in our EPIC system...    Continue your current medications the same...  Please return to our lab one morning soon for your FASTING blood work...    We will contact you w/ the results when available...   Keep up the good work w/ diet & exercise...  Check w/ your GYN about getting a follow up bone density test...  Call for any questions...  Let's plan a follow up visit in [id number], sooner if needed for problems.Marland Kitchen..Marland Kitchen

## 2016-06-20 ENCOUNTER — Other Ambulatory Visit (INDEPENDENT_AMBULATORY_CARE_PROVIDER_SITE_OTHER): Payer: Medicare Other

## 2016-06-20 DIAGNOSIS — I10 Essential (primary) hypertension: Secondary | ICD-10-CM

## 2016-06-20 DIAGNOSIS — E039 Hypothyroidism, unspecified: Secondary | ICD-10-CM

## 2016-06-20 DIAGNOSIS — Z8639 Personal history of other endocrine, nutritional and metabolic disease: Secondary | ICD-10-CM | POA: Diagnosis not present

## 2016-06-20 DIAGNOSIS — K5730 Diverticulosis of large intestine without perforation or abscess without bleeding: Secondary | ICD-10-CM | POA: Diagnosis not present

## 2016-06-20 DIAGNOSIS — E559 Vitamin D deficiency, unspecified: Secondary | ICD-10-CM | POA: Diagnosis not present

## 2016-06-20 LAB — COMPREHENSIVE METABOLIC PANEL
ALK PHOS: 49 U/L (ref 39–117)
ALT: 14 U/L (ref 0–35)
AST: 15 U/L (ref 0–37)
Albumin: 4.3 g/dL (ref 3.5–5.2)
BILIRUBIN TOTAL: 0.7 mg/dL (ref 0.2–1.2)
BUN: 15 mg/dL (ref 6–23)
CALCIUM: 9.5 mg/dL (ref 8.4–10.5)
CO2: 29 mEq/L (ref 19–32)
Chloride: 103 mEq/L (ref 96–112)
Creatinine, Ser: 0.77 mg/dL (ref 0.40–1.20)
GFR: 77.24 mL/min (ref >60.00)
Glucose, Bld: 109 mg/dL — ABNORMAL HIGH (ref 70–99)
POTASSIUM: 4.3 meq/L (ref 3.5–5.1)
Sodium: 138 mEq/L (ref 135–145)
TOTAL PROTEIN: 6.9 g/dL (ref 6.0–8.3)

## 2016-06-20 LAB — CBC WITH DIFFERENTIAL/PLATELET
BASOS PCT: 1 % (ref 0.0–3.0)
Basophils Absolute: 0 10*3/uL (ref 0.0–0.1)
EOS PCT: 1.2 % (ref 0.0–5.0)
Eosinophils Absolute: 0 10*3/uL (ref 0.0–0.7)
HCT: 43.4 % (ref 36.0–46.0)
Hemoglobin: 14.6 g/dL (ref 12.0–15.0)
LYMPHS PCT: 34.2 % (ref 12.0–46.0)
Lymphs Abs: 1.2 10*3/uL (ref 0.7–4.0)
MCHC: 33.6 g/dL (ref 30.0–36.0)
MCV: 94 fl (ref 78.0–100.0)
Monocytes Absolute: 0.3 10*3/uL (ref 0.1–1.0)
Monocytes Relative: 8.7 % (ref 3.0–12.0)
NEUTROS ABS: 2 10*3/uL (ref 1.4–7.7)
Neutrophils Relative %: 54.9 % (ref 43.0–77.0)
PLATELETS: 196 10*3/uL (ref 150.0–400.0)
RBC: 4.62 Mil/uL (ref 3.87–5.11)
RDW: 12.9 % (ref 11.5–15.5)
WBC: 3.6 10*3/uL — AB (ref 4.0–10.5)

## 2016-06-20 LAB — LIPID PANEL
CHOL/HDL RATIO: 2
CHOLESTEROL: 188 mg/dL (ref 0–200)
HDL: 81.5 mg/dL (ref >39.00)
LDL Cholesterol: 92 mg/dL (ref 0–99)
NonHDL: 106.24
TRIGLYCERIDES: 70 mg/dL (ref 0.0–149.0)
VLDL: 14 mg/dL (ref 0.0–40.0)

## 2016-06-20 LAB — VITAMIN D 25 HYDROXY (VIT D DEFICIENCY, FRACTURES): VITD: 59.47 ng/mL (ref 30.00–100.00)

## 2016-06-20 LAB — TSH: TSH: 0.14 u[IU]/mL — ABNORMAL LOW (ref 0.35–4.50)

## 2016-06-21 ENCOUNTER — Other Ambulatory Visit: Payer: Self-pay | Admitting: Internal Medicine

## 2016-06-22 MED ORDER — LEVOTHYROXINE SODIUM 88 MCG PO TABS: 88.0000 ug | ORAL_TABLET | Freq: Every day | ORAL | 3 refills | Status: DC

## 2016-06-23 ENCOUNTER — Telehealth: Payer: Self-pay

## 2016-06-23 ENCOUNTER — Encounter: Payer: Self-pay | Admitting: Family Medicine

## 2016-06-23 ENCOUNTER — Ambulatory Visit (INDEPENDENT_AMBULATORY_CARE_PROVIDER_SITE_OTHER): Payer: Medicare Other | Admitting: Family Medicine

## 2016-06-23 VITALS — BP 124/72 | HR 64 | Temp 98.0°F | Ht 67.0 in | Wt 147.0 lb

## 2016-06-23 DIAGNOSIS — F411 Generalized anxiety disorder: Secondary | ICD-10-CM

## 2016-06-23 DIAGNOSIS — E039 Hypothyroidism, unspecified: Secondary | ICD-10-CM | POA: Diagnosis not present

## 2016-06-23 NOTE — Telephone Encounter (Signed)
Controlled substance:  Ativan  [facility] [address]  Status (No new findings/new findings):  No new findings.  New Findings (if applicable):    Additional Comments:     **New findings routed to provider**

## 2016-06-23 NOTE — Patient Instructions (Addendum)
Schedule lab visit in 3 months.  Follow up with Dr. Earlene PlaterWallace in 6 months.

## 2016-06-23 NOTE — Progress Notes (Signed)
Ashley [name] is a 76 y.o. female is here to [place].   Patient Care Team: Briscoe Deutscher, DO as PCP - General (Family Medicine)   History of Present Illness:   Ashley [name], Ashley [name], acting as scribe for Dr. Juleen China.  HPI: Patient comes in today to establish care.  She has no acute complaints or concerns.  Allergy and medication lists have been updated.  1. Hypothyroidism. Recent change to medication. Patient denies change in energy level, diarrhea, heat / cold intolerance, nervousness, palpitations and weight changes. Symptoms have stabilized.    2. Anxiety state. Controlled on current medications without side effects. No concerns today.    Health Maintenance Due  Topic Date Due  . DEXA SCAN  05/11/2004  . TETANUS/TDAP  11/15/2014   PMHx, SurgHx, SocialHx, Medications, and Allergies were reviewed in the Visit Navigator and updated as appropriate.   Past Medical History:  Diagnosis Date  . Anxiety state, unspecified   . Diverticulosis of colon (without mention of hemorrhage)   . Osteoporosis, unspecified   . Other chest pain   . Other malaise and fatigue   . Personal history of other malignant neoplasm of skin   . Unspecified hemorrhoids without mention of complication   . Unspecified hypothyroidism    Past Surgical History:  Procedure Laterality Date  . COLONOSCOPY     Family History  Problem Relation Age of Onset  . Ovarian cancer Mother   . Pancreatic cancer Brother   . Diabetes Unknown    Social History  Substance Use Topics  . Smoking status: Never Smoker  . Smokeless tobacco: Never Used  . Alcohol use No   Current Medications and Allergies:   .  amLODipine (NORVASC) 5 MG tablet, Take 5 mg by mouth daily., Disp: , Rfl:  .  aspirin 81 MG tablet, Take 81 mg by mouth daily., Disp: , Rfl:  .  Calcium Carb-Cholecalciferol (CALTRATE 600+D) 600-800 MG-UNIT TABS, Take 1 tablet by mouth daily., Disp: , Rfl:  .  citalopram (CELEXA) 20 MG tablet, Take 20 mg by  mouth daily., Disp: , Rfl:  .  levothyroxine (SYNTHROID, LEVOTHROID) 88 MCG tablet, Take 88 mcg by mouth daily before breakfast., Disp: , Rfl:  .  LORazepam (ATIVAN) 1 MG tablet, Take 1 mg by mouth every 8 (eight) hours., Disp: , Rfl:  .  Magnesium Oxide (MAG-200 PO), Take 250 mg by mouth daily. , Disp: , Rfl:  .  Multiple Vitamins-Minerals (ICAPS AREDS 2 PO), Take 1 tablet by mouth daily., Disp: , Rfl:  .  Omega-3 Fatty Acids (FISH OIL) 1000 MG CAPS, Take by mouth., Disp: , Rfl:   Allergies  Allergen Reactions  . Alendronate Sodium     REACTION: pt states "blisters in my mouth"  . Latex     sensitive  . Penicillins Other (See Comments)    REACTION: fatigue/malaise, "feels extremely bad" Did PCN reaction causing immediate rash, facial/tongue/throat swelling, SOB or lightheadedness with hypotension: no Has patient had a PCN reaction causing severe rash involving mucus membranes or skin necrosis: no Has patient had a PCN reaction that required hospitalization : no Has patient had a PCN reaction occurring within the last 10 years: yes If all of the above answers are "NO", then may proceed with Cephalosporin use. Extremely lethargic  . Raloxifene     REACTION: pt states "it dried me out"  . Risedronate Sodium     REACTION: pt states "teeth problems"   Review of Systems:  Review of Systems  Constitutional: Negative for chills and fever.  HENT: Negative for congestion, ear pain and sore throat.   Eyes: Negative for blurred vision and double vision.  Respiratory: Negative for cough and shortness of breath.   Cardiovascular: Negative for chest pain and palpitations.  Gastrointestinal: Negative for abdominal pain, nausea and vomiting.  Genitourinary: Negative for frequency.  Musculoskeletal: Negative for back pain and neck pain.  Neurological: Negative for dizziness, loss of consciousness and headaches.  Psychiatric/Behavioral: Negative for depression. The patient is not  nervous/anxious.    Vitals:   Vitals:   06/23/16 0939  BP: 124/72  Pulse: 64  Temp: 98 F (36.7 C)  SpO2: 95%  Weight: 147 lb (66.7 kg)  Height: _0  (1.702 m)     Body mass index is 23.02 kg/m.  Physical Exam:   Physical Exam  Constitutional: She appears well-nourished.  HENT:  Head: Normocephalic and atraumatic.  Eyes: EOM are normal. Pupils are equal, round, and reactive to light.  Neck: Normal range of motion. Neck supple.  Cardiovascular: Normal rate, regular rhythm, normal heart sounds and intact distal pulses.   Pulmonary/Chest: Effort normal.  Abdominal: Soft.  Skin: Skin is warm.  Psychiatric: She has a normal mood and affect. Her behavior is normal.  Nursing note and vitals reviewed.   Results for orders placed or performed in visit on 06/20/16  Comp Met (CMET)  Result Value Ref Range   Sodium 138 135 - 145 mEq/L   Potassium 4.3 3.5 - 5.1 mEq/L   Chloride 103 96 - 112 mEq/L   CO2 29 19 - 32 mEq/L   Glucose, Bld 109 (H) 70 - 99 mg/dL   BUN 15 6 - 23 mg/dL   Creatinine, Ser 0.77 0.40 - 1.20 mg/dL   Total Bilirubin 0.7 0.2 - 1.2 mg/dL   Alkaline Phosphatase 49 39 - 117 U/L   AST 15 0 - 37 U/L   ALT 14 0 - 35 U/L   Total Protein 6.9 6.0 - 8.3 g/dL   Albumin 4.3 3.5 - 5.2 g/dL   Calcium 9.5 8.4 - 10.5 mg/dL   GFR 77.24 >60.00 mL/min  Lipid panel  Result Value Ref Range   Cholesterol 188 0 - 200 mg/dL   Triglycerides 70.0 0.0 - 149.0 mg/dL   HDL 81.50 >39.00 mg/dL   VLDL 14.0 0.0 - 40.0 mg/dL   LDL Cholesterol 92 0 - 99 mg/dL   Total CHOL/HDL Ratio 2    NonHDL 106.24   TSH  Result Value Ref Range   TSH 0.14 (L) 0.35 - 4.50 uIU/mL  CBC w/Diff  Result Value Ref Range   WBC 3.6 (L) 4.0 - 10.5 K/uL   RBC 4.62 3.87 - 5.11 Mil/uL   Hemoglobin 14.6 12.0 - 15.0 g/dL   HCT 43.4 36.0 - 46.0 %   MCV 94.0 78.0 - 100.0 fl   MCHC 33.6 30.0 - 36.0 g/dL   RDW 12.9 11.5 - 15.5 %   Platelets 196.0 150.0 - 400.0 K/uL   Neutrophils Relative % 54.9 43.0 - 77.0  %   Lymphocytes Relative 34.2 12.0 - 46.0 %   Monocytes Relative 8.7 3.0 - 12.0 %   Eosinophils Relative 1.2 0.0 - 5.0 %   Basophils Relative 1.0 0.0 - 3.0 %   Neutro Abs 2.0 1.4 - 7.7 K/uL   Lymphs Abs 1.2 0.7 - 4.0 K/uL   Monocytes Absolute 0.3 0.1 - 1.0 K/uL   Eosinophils Absolute 0.0 0.0 - 0.7  K/uL   Basophils Absolute 0.0 0.0 - 0.1 K/uL  Vitamin D (25 hydroxy)  Result Value Ref Range   VITD 59.47 30.00 - 100.00 ng/mL   Assessment and Plan:   Ashley [name] was seen today for establish care.  Diagnoses and all orders for this visit:  Hypothyroidism, unspecified type Comments: Recheck labs in 3 months. Future order completed. [place] lab visit only. Orders: -     TSH; Future -     T4, free; Future  Anxiety state Comments: Controlled substance contract completed today.   . Reviewed expectations re: course of current medical issues. . Discussed self-management of symptoms. . Outlined signs and symptoms indicating need for more acute intervention. . Patient verbalized understanding and all questions were answered. . See orders for this visit as documented in the electronic medical record. . Patient received an After Visit Summary.  Records requested if needed. I spent 30 minutes with this patient, greater than 50% was face-to-face time counseling regarding the above diagnoses.  Ashley [name] served as Education administrator during this visit. History, Physical, and Plan performed by medical provider. Documentation and orders reviewed and attested to. Briscoe Deutscher, D.O.  Briscoe Deutscher, DO [place], [facility] 06/23/2016  Future Appointments Date Time Provider [place]  09/23/2016 10:00 AM [facility] None  12/26/2016 9:00 AM Briscoe Deutscher, DO [facility] None

## 2016-06-28 ENCOUNTER — Other Ambulatory Visit: Payer: Self-pay | Admitting: Internal Medicine

## 2016-08-09 ENCOUNTER — Other Ambulatory Visit: Payer: Self-pay | Admitting: Pulmonary Disease

## 2016-08-09 ENCOUNTER — Telehealth: Payer: Self-pay | Admitting: Pulmonary Disease

## 2016-08-09 NOTE — Telephone Encounter (Signed)
Called and spoke with pt and she is aware that her rx has been called to the pharmacy earlier today.  Nothing further is needed.

## 2016-08-09 NOTE — Telephone Encounter (Signed)
Please advise on refill on Lorazepam.

## 2016-09-21 ENCOUNTER — Telehealth: Payer: Self-pay | Admitting: Family Medicine

## 2016-09-21 NOTE — Telephone Encounter (Signed)
Patient has an appointment for labs on Friday and has additional questions as to if she should go ahead and make a follow up appointment and how long it will take for the results due to her not feeling well. I advised the patient that it can take up to 3 days before results come back however, I would send a note for clinical staff to call her to advise further.

## 2016-09-22 NOTE — Telephone Encounter (Signed)
Spoke with patient.  States she wanted to let us know that she doesn't think the lower dose of thyroid medication has been working for her.  She only wanted Dr. Earlene PlaterWallace to be aware of this before she had her labs drawn.  She is ok with getting labs on Friday and waiting for results and recommendations from Dr. Earlene PlaterWallace.

## 2016-09-23 ENCOUNTER — Other Ambulatory Visit (INDEPENDENT_AMBULATORY_CARE_PROVIDER_SITE_OTHER): Payer: Medicare Other

## 2016-09-23 DIAGNOSIS — E039 Hypothyroidism, unspecified: Secondary | ICD-10-CM | POA: Diagnosis not present

## 2016-09-23 LAB — T4, FREE: Free T4: 1.35 ng/dL (ref 0.60–1.60)

## 2016-09-23 LAB — TSH: TSH: 0.29 u[IU]/mL — ABNORMAL LOW (ref 0.35–4.50)

## 2016-09-26 ENCOUNTER — Other Ambulatory Visit: Payer: Self-pay | Admitting: Family Medicine

## 2016-09-26 DIAGNOSIS — E039 Hypothyroidism, unspecified: Secondary | ICD-10-CM

## 2016-09-26 MED ORDER — LEVOTHYROXINE SODIUM 75 MCG PO TABS: 75.0000 ug | ORAL_TABLET | Freq: Every day | ORAL | 0 refills | Status: DC

## 2016-11-07 ENCOUNTER — Other Ambulatory Visit (INDEPENDENT_AMBULATORY_CARE_PROVIDER_SITE_OTHER): Payer: Medicare Other

## 2016-11-07 ENCOUNTER — Telehealth: Payer: Self-pay | Admitting: Family Medicine

## 2016-11-07 DIAGNOSIS — E039 Hypothyroidism, unspecified: Secondary | ICD-10-CM

## 2016-11-07 LAB — TSH: TSH: 2.51 u[IU]/mL (ref 0.35–4.50)

## 2016-11-07 NOTE — Telephone Encounter (Signed)
Please see message. °

## 2016-11-07 NOTE — Telephone Encounter (Signed)
Spoke to pt, having episodes of weakness taking a nap everyday not sure if thyroid or Citalopram that she is weaning off of. Pt wants Dr. Earlene Plater to be aware of symptoms prior to lab results. Told pt I will send message to Dr. Earlene Plater. Pt verbalized understanding.

## 2016-11-07 NOTE — Telephone Encounter (Signed)
Patient is on synthroid and wanted to inquire about symptoms she's having.  Ty,  -LL

## 2016-11-07 NOTE — Telephone Encounter (Signed)
Left message on patient cell and home phone to return call.

## 2016-11-07 NOTE — Telephone Encounter (Signed)
Patient calling back to speak to nurse about her synthroid and the symptoms she is having. Call patient to advise.

## 2016-11-09 NOTE — Telephone Encounter (Signed)
TSH was in the normal range. If still having symptoms described below, come in for visit to discuss.

## 2016-11-10 MED ORDER — LEVOTHYROXINE SODIUM 75 MCG PO TABS: 75.0000 ug | ORAL_TABLET | Freq: Every day | ORAL | 0 refills | Status: DC

## 2016-11-10 NOTE — Telephone Encounter (Signed)
Patient stated that she is doing better. She does not think that she needs to come in at this point. Patient is wanting a refill. I will send to mail order pharmacy.

## 2016-11-14 ENCOUNTER — Encounter: Payer: Self-pay | Admitting: Family Medicine

## 2016-11-14 ENCOUNTER — Ambulatory Visit (INDEPENDENT_AMBULATORY_CARE_PROVIDER_SITE_OTHER): Payer: Medicare Other | Admitting: Family Medicine

## 2016-11-14 VITALS — BP 126/78 | HR 76 | Temp 97.8°F | Ht 67.0 in | Wt 150.0 lb

## 2016-11-14 DIAGNOSIS — I10 Essential (primary) hypertension: Secondary | ICD-10-CM

## 2016-11-14 DIAGNOSIS — T43205A Adverse effect of unspecified antidepressants, initial encounter: Secondary | ICD-10-CM

## 2016-11-14 DIAGNOSIS — R42 Dizziness and giddiness: Secondary | ICD-10-CM

## 2016-11-14 NOTE — Patient Instructions (Signed)
It was so nice to see you!  You should fel better in the next few weeks. It is common to feel this discontinuation syndrome after stopping Celexa.  Your blood pressures are very normal.  We will check labs today.

## 2016-11-14 NOTE — Progress Notes (Signed)
Ashley [name] is a 76 y.o. female is here for follow up.  History of Present Illness:   Britt Bottom CMA acting as scribe for Dr. Earlene Plater.  HPI:   1. Antidepressant discontinuation syndrome, initial encounter. Stopping Celexa. Feeling dizzy at times, BP slightly elevated hen she is anxious. Sleeping okay.     2. Essential hypertension.   Home blood pressure readings slightly higher than baseline.  Avoiding excessive salt intake?   YES    NO Trying to exercise on a regular basis?   YES    NO Review: taking medications as instructed, no medication side effects noted, no TIAs, no chest pain on exertion, no dyspnea on exertion, no swelling of ankles.   Wt Readings from Last 3 Encounters:  11/14/16 150 lb (68 kg)  06/23/16 147 lb (66.7 kg)  06/16/16 148 lb 2 oz (67.2 kg)   Reports that she has never smoked. She has never used smokeless tobacco.  BP Readings from Last 3 Encounters:  11/14/16 126/78  06/23/16 124/72  06/16/16 122/84   Lab Results  Component Value Date   CREATININE 0.80 11/14/2016      Health Maintenance Due  Topic Date Due  . DEXA SCAN  05/11/2004  . TETANUS/TDAP  11/15/2014  . INFLUENZA VACCINE  09/14/2016   Depression screen [facility] 2/9 06/23/2016 08/19/2013 08/15/2012  Decreased Interest 0 0 0  Down, Depressed, Hopeless 0 0 0  PHQ - 2 Score 0 0 0   PMHx, SurgHx, SocialHx, FamHx, Medications, and Allergies were reviewed in the Visit Navigator and updated as appropriate.   Patient Active Problem List   Diagnosis Date Noted  . 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 IFG (impaired fasting glucose) 06/16/2016  . Essential hypertension 06/17/2015  . H/O mixed hyperlipidemia 06/17/2015  . Dysthymia 06/17/2015  . Vitamin D deficiency 06/17/2014  . Osteopenia 08/19/2013  . Diverticulosis of large intestine 05/04/2007  . Hypothyroidism 05/03/2007  . Anxiety state 05/03/2007  . Hemorrhoids 05/03/2007   Social History  Substance Use  Topics  . Smoking status: Never Smoker  . Smokeless tobacco: Never Used  . Alcohol use No   Current Medications and Allergies:   .  amLODipine (NORVASC) 5 MG tablet, TAKE 1 TABLET(5 MG) BY MOUTH DAILY, Disp: 90 tablet, Rfl: 0 .  aspirin 81 MG tablet, Take 81 mg by mouth daily., Disp: , Rfl:  .  Calcium Carb-Cholecalciferol (CALTRATE 600+D) 600-800 MG-UNIT TABS, Take 1 tablet by mouth daily., Disp: , Rfl:  .  levothyroxine (SYNTHROID, LEVOTHROID) 75 MCG tablet, Take 1 tablet (75 mcg total) by mouth daily before breakfast., Disp: 90 tablet, Rfl: 0 .  LORazepam (ATIVAN) 1 MG tablet, TAKE 1/2 TO 1 TABLET BY MOUTH THREE TIMES DAILY AS NEEDED, Disp: 90 tablet, Rfl: 5 .  Magnesium Oxide (MAG-200 PO), Take 250 mg by mouth daily. , Disp: , Rfl:  .  Multiple Vitamins-Minerals (ICAPS AREDS 2 PO), Take 1 tablet by mouth daily., Disp: , Rfl:  .  Omega-3 Fatty Acids (FISH OIL) 1000 MG CAPS, Take by mouth., Disp: , Rfl:  .  citalopram (CELEXA) 20 MG tablet, Take 20 mg by mouth daily., Disp: , Rfl:   Allergies  Allergen Reactions  . Alendronate Sodium     REACTION: pt states "blisters in my mouth"  . Penicillins Other (See Comments)    REACTION: fatigue/malaise, "feels extremely bad" Did PCN reaction causing immediate rash, facial/tongue/throat swelling, SOB or lightheadedness with hypotension: no Has patient had a PCN reaction  causing severe rash involving mucus membranes or skin necrosis: no Has patient had a PCN reaction that required hospitalization : no Has patient had a PCN reaction occurring within the last 10 years: yes If all of the above answers are "NO", then may proceed with Cephalosporin use. Extremely lethargic  . Raloxifene     REACTION: pt states "it dried me out"  . Risedronate Sodium     REACTION: pt states "teeth problems"  . Latex Rash   Review of Systems   Pertinent items are noted in the HPI. Otherwise, ROS is negative.  Vitals:   Vitals:   11/14/16 1439  BP: 126/78    Pulse: 76  Temp: 97.8 F (36.6 C)  TempSrc: Oral  SpO2: 94%  Weight: 150 lb (68 kg)  Height:  (1.702 m)     Body mass index is 23.49 kg/m.   Physical Exam:   Physical Exam  Constitutional: She appears well-nourished.  HENT:  Head: Normocephalic and atraumatic.  Eyes: Pupils are equal, round, and reactive to light. EOM are normal.  Neck: Normal range of motion. Neck supple.  Cardiovascular: Normal rate, regular rhythm, normal heart sounds and intact distal pulses.   Pulmonary/Chest: Effort normal.  Abdominal: Soft.  Skin: Skin is warm.  Psychiatric: She has a normal mood and affect. Her behavior is normal.  Nursing note and vitals reviewed.  Results for orders placed or performed in visit on 11/14/16  CBC with Differential/Platelet  Result Value Ref Range   WBC 5.2 4.0 - 10.5 K/uL   RBC 4.47 3.87 - 5.11 Mil/uL   Hemoglobin 14.3 12.0 - 15.0 g/dL   HCT 16.1 09.6 - 04.5 %   MCV 95.5 78.0 - 100.0 fl   MCHC 33.5 30.0 - 36.0 g/dL   RDW 40.9 81.1 - 91.4 %   Platelets 200.0 150.0 - 400.0 K/uL   Neutrophils Relative % 56.3 43.0 - 77.0 %   Lymphocytes Relative 32.2 12.0 - 46.0 %   Monocytes Relative 8.0 3.0 - 12.0 %   Eosinophils Relative 2.1 0.0 - 5.0 %   Basophils Relative 1.4 0.0 - 3.0 %   Neutro Abs 2.9 1.4 - 7.7 K/uL   Lymphs Abs 1.7 0.7 - 4.0 K/uL   Monocytes Absolute 0.4 0.1 - 1.0 K/uL   Eosinophils Absolute 0.1 0.0 - 0.7 K/uL   Basophils Absolute 0.1 0.0 - 0.1 K/uL  Comprehensive metabolic panel  Result Value Ref Range   Sodium 137 135 - 145 mEq/L   Potassium 4.5 3.5 - 5.1 mEq/L   Chloride 103 96 - 112 mEq/L   CO2 28 19 - 32 mEq/L   Glucose, Bld 107 (H) 70 - 99 mg/dL   BUN 18 6 - 23 mg/dL   Creatinine, Ser 7.82 0.40 - 1.20 mg/dL   Total Bilirubin 0.5 0.2 - 1.2 mg/dL   Alkaline Phosphatase 42 39 - 117 U/L   AST 14 0 - 37 U/L   ALT 10 0 - 35 U/L   Total Protein 6.5 6.0 - 8.3 g/dL   Albumin 4.1 3.5 - 5.2 g/dL   Calcium 9.7 8.4 - 95.6 mg/dL   GFR 21.30  >86.57 mL/min   Assessment and Plan:   Sherley was seen today for follow-up.  Diagnoses and all orders for this visit:  Antidepressant discontinuation syndrome, initial encounter Comments: Reviewed expectations and red flags.  Essential hypertension -     CBC with Differential/Platelet -     Comprehensive metabolic panel   .  Reviewed expectations re: course of current medical issues. . Discussed self-management of symptoms. . Outlined signs and symptoms indicating need for more acute intervention. . Patient verbalized understanding and all questions were answered. Marland Kitchen Health Maintenance issues including appropriate healthy diet, exercise, and smoking avoidance were discussed with patient. . See orders for this visit as documented in the electronic medical record. . Patient received an After Visit Summary.  CMA served as Neurosurgeon during this visit. History, Physical, and Plan performed by medical provider. The above documentation has been reviewed and is accurate and complete. Helane Rima, D.O.  Helane Rima, DO [place], [facility] 11/20/2016  Future Appointments Date Time Provider Department Center  12/26/2016 8:00 AM Sherrye Payor, RN [facility] None  12/26/2016 9:00 AM Helane Rima, DO [facility] None

## 2016-11-15 LAB — CBC WITH DIFFERENTIAL/PLATELET
Basophils Absolute: 0.1 10*3/uL (ref 0.0–0.1)
Basophils Relative: 1.4 % (ref 0.0–3.0)
Eosinophils Absolute: 0.1 10*3/uL (ref 0.0–0.7)
Eosinophils Relative: 2.1 % (ref 0.0–5.0)
HCT: 42.7 % (ref 36.0–46.0)
Hemoglobin: 14.3 g/dL (ref 12.0–15.0)
Lymphocytes Relative: 32.2 % (ref 12.0–46.0)
Lymphs Abs: 1.7 10*3/uL (ref 0.7–4.0)
MCHC: 33.5 g/dL (ref 30.0–36.0)
MCV: 95.5 fl (ref 78.0–100.0)
Monocytes Absolute: 0.4 10*3/uL (ref 0.1–1.0)
Monocytes Relative: 8 % (ref 3.0–12.0)
Neutro Abs: 2.9 10*3/uL (ref 1.4–7.7)
Neutrophils Relative %: 56.3 % (ref 43.0–77.0)
Platelets: 200 10*3/uL (ref 150.0–400.0)
RBC: 4.47 Mil/uL (ref 3.87–5.11)
RDW: 13 % (ref 11.5–15.5)
WBC: 5.2 10*3/uL (ref 4.0–10.5)

## 2016-11-15 LAB — COMPREHENSIVE METABOLIC PANEL
ALT: 10 U/L (ref 0–35)
AST: 14 U/L (ref 0–37)
Albumin: 4.1 g/dL (ref 3.5–5.2)
Alkaline Phosphatase: 42 U/L (ref 39–117)
BUN: 18 mg/dL (ref 6–23)
CO2: 28 mEq/L (ref 19–32)
Calcium: 9.7 mg/dL (ref 8.4–10.5)
Chloride: 103 mEq/L (ref 96–112)
Creatinine, Ser: 0.8 mg/dL (ref 0.40–1.20)
GFR: 73.83 mL/min (ref >60.00)
Glucose, Bld: 107 mg/dL — ABNORMAL HIGH (ref 70–99)
Potassium: 4.5 mEq/L (ref 3.5–5.1)
Sodium: 137 mEq/L (ref 135–145)
Total Bilirubin: 0.5 mg/dL (ref 0.2–1.2)
Total Protein: 6.5 g/dL (ref 6.0–8.3)

## 2016-11-20 DIAGNOSIS — T43205A Adverse effect of unspecified antidepressants, initial encounter: Secondary | ICD-10-CM | POA: Insufficient documentation

## 2016-12-26 ENCOUNTER — Encounter: Payer: Self-pay | Admitting: Family Medicine

## 2016-12-26 ENCOUNTER — Ambulatory Visit (INDEPENDENT_AMBULATORY_CARE_PROVIDER_SITE_OTHER): Payer: Medicare Other | Admitting: *Deleted

## 2016-12-26 ENCOUNTER — Ambulatory Visit (INDEPENDENT_AMBULATORY_CARE_PROVIDER_SITE_OTHER): Payer: Medicare Other | Admitting: Family Medicine

## 2016-12-26 ENCOUNTER — Encounter: Payer: Self-pay | Admitting: *Deleted

## 2016-12-26 VITALS — BP 118/72 | HR 77 | Resp 16 | Ht 67.0 in | Wt 150.0 lb

## 2016-12-26 VITALS — BP 118/72 | HR 76 | Temp 97.8°F | Wt 150.0 lb

## 2016-12-26 DIAGNOSIS — F419 Anxiety disorder, unspecified: Secondary | ICD-10-CM | POA: Diagnosis not present

## 2016-12-26 DIAGNOSIS — R7309 Other abnormal glucose: Secondary | ICD-10-CM

## 2016-12-26 DIAGNOSIS — Z0000 Encounter for general adult medical examination without abnormal findings: Secondary | ICD-10-CM

## 2016-12-26 DIAGNOSIS — I10 Essential (primary) hypertension: Secondary | ICD-10-CM | POA: Diagnosis not present

## 2016-12-26 LAB — POCT GLYCOSYLATED HEMOGLOBIN (HGB A1C): Hemoglobin A1C: 5.2

## 2016-12-26 NOTE — Progress Notes (Signed)
I have personally reviewed the Medicare Annual Wellness questionnaire and have noted 1. The patient's medical and social history 2. Their use of alcohol, tobacco or illicit drugs 3. Their current medications and supplements 4. The patient's functional ability including ADL's, fall risks, home safety risks and hearing or visual impairment. 5. Diet and physical activities 6. Evidence for depression or mood disorders 7. Reviewed Updated provider list, see scanned forms and [facility] Snapshot.   The patients weight, height, BMI and visual acuity have been recorded in the chart I have made referrals, counseling and provided education to the patient based review of the above and I have provided the pt with a written personalized care plan for preventive services.  I have provided the patient with a copy of your personalized plan for preventive services. Instructed to take the time to review along with their updated medication list.  Ashley [name]  

## 2016-12-26 NOTE — Patient Instructions (Signed)
[name] , Thank you for taking time to come for your Medicare Wellness Visit. I appreciate your ongoing commitment to your health goals. Please review the following plan we discussed and let me know if I can assist you in the future.   These are the goals we discussed: Goals    None      This is a list of the screening recommended for you and due dates:  Health Maintenance  Topic Date Due  . DEXA scan (bone density measurement)  12/26/2017*  . Tetanus Vaccine  12/26/2017*  . Flu Shot  Completed  . Pneumonia vaccines  Completed  *Topic was postponed. The date shown is not the original due date.   Preventive Care for Adults  A healthy lifestyle and preventive care can promote health and wellness. Preventive health guidelines for adults include the following key practices.  . A routine yearly physical is a good way to check with your health care provider about your health and preventive screening. It is a chance to share any concerns and updates on your health and to receive a thorough exam.  . Visit your dentist for a routine exam and preventive care every 6 months. Brush your teeth twice a day and floss once a day. Good oral hygiene prevents tooth decay and gum disease.  . The frequency of eye exams is based on your age, health, family medical history, use  of contact lenses, and other factors. Follow your health care provider's recommendations for frequency of eye exams.  . Eat a healthy diet. Foods like vegetables, fruits, whole grains, low-fat dairy products, and lean protein foods contain the nutrients you need without too many calories. Decrease your intake of foods high in solid fats, added sugars, and salt. Eat the right amount of calories for you. Get information about a proper diet from your health care provider, if necessary.  . Regular physical exercise is one of the most important things you can do for your health. Most adults should get at least 150 minutes of  moderate-intensity exercise (any activity that increases your heart rate and causes you to sweat) each week. In addition, most adults need muscle-strengthening exercises on 2 or more days a week.  Silver Sneakers may be a benefit available to you. To determine eligibility, you may visit the website: www.silversneakers.com or contact program at (581)[phone] Mon-Fri between 8AM-8PM.   . Maintain a healthy weight. The body mass index (BMI) is a screening tool to identify possible weight problems. It provides an estimate of body fat based on height and weight. Your health care provider can find your BMI and can help you achieve or maintain a healthy weight.   For adults 20 years and older: ? A BMI below 18.5 is considered underweight. ? A BMI of 18.5 to 24.9 is normal. ? A BMI of 25 to 29.9 is considered overweight. ? A BMI of 30 and above is considered obese.   . Maintain normal blood lipids and cholesterol levels by exercising and minimizing your intake of saturated fat. Eat a balanced diet with plenty of fruit and vegetables. Blood tests for lipids and cholesterol should begin at age 77 and be repeated every 5 years. If your lipid or cholesterol levels are high, you are over 50, or you are at high risk for heart disease, you may need your cholesterol levels checked more frequently. Ongoing high lipid and cholesterol levels should be treated with medicines if diet and exercise are not working.  .Marland Kitchen  If you smoke, find out from your health care provider how to quit. If you do not use tobacco, please do not start.  . If you choose to drink alcohol, please do not consume more than 2 drinks per day. One drink is considered to be 12 ounces (355 mL) of beer, 5 ounces (148 mL) of wine, or 1.5 ounces (44 mL) of liquor.  . If you are 26-52 years old, ask your health care provider if you should take aspirin to prevent strokes.  . Use sunscreen. Apply sunscreen liberally and repeatedly throughout the day. You  should seek shade when your shadow is shorter than you. Protect yourself by wearing long sleeves, pants, a wide-brimmed hat, and sunglasses year round, whenever you are outdoors.  . Once a month, do a whole body skin exam, using a mirror to look at the skin on your back. Tell your health care provider of new moles, moles that have irregular borders, moles that are larger than a pencil eraser, or moles that have changed in shape or color.

## 2016-12-26 NOTE — Progress Notes (Signed)
Ashley [name] is a 76 y.o. female is here for follow up.  History of Present Illness:   HPI:   1. Elevated fasting glucose.   Lab Results  Component Value Date   HGBA1C 5.2 12/26/2016   Patient complains of morning lightheadedness. She checks her BP irregularly - normal range consistently. Endocrine ROS: negative for - hot flashes, malaise/lethargy, mood swings, palpitations, polydipsia/polyuria, skin changes, temperature intolerance or unexpected weight changes.   2. Anxiety. Most of it has to do with her husband's recent health issues. He had a stroke and doesn't want anyone to know. She worries about him and doesn't know what to expect.     3. Essential hypertension.   Avoiding excessive salt intake? []   YES  [x]   NO Trying to exercise on a regular basis? []   YES  [x]   NO Review: taking medications as instructed, no medication side effects noted, no chest pain on exertion, no dyspnea on exertion, no swelling of ankles.   Wt Readings from Last 3 Encounters:  12/26/16 150 lb (68 kg)  12/26/16 150 lb (68 kg)  11/14/16 150 lb (68 kg)    reports that  has never smoked. she has never used smokeless tobacco. BP Readings from Last 3 Encounters:  12/26/16 118/72  12/26/16 118/72  11/14/16 126/78   Lab Results  Component Value Date   CREATININE 0.80 11/14/2016     There are no preventive care reminders to display for this patient. [facility] 12/26/2016 06/23/2016 08/19/2013  Decreased Interest 0 0 0  Down, Depressed, Hopeless 0 0 0  PHQ - 2 Score 0 0 0  Altered sleeping 3 - -  Tired, decreased energy 0 - -  Change in appetite 0 - -  Feeling bad or failure about yourself  0 - -  Trouble concentrating 0 - -  Moving slowly or fidgety/restless 0 - -  Suicidal thoughts 0 - -  PHQ-9 Score 3 - -  Difficult doing work/chores Somewhat difficult - -   PMHx, SurgHx, SocialHx, FamHx, Medications, and Allergies were reviewed in the Visit Navigator and updated as  appropriate.   Patient Active Problem List   Diagnosis Date Noted  . 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 IFG (impaired fasting glucose) 06/16/2016  . Essential hypertension 06/17/2015  . H/O mixed hyperlipidemia 06/17/2015  . Dysthymia 06/17/2015  . Vitamin D deficiency 06/17/2014  . Osteopenia 08/19/2013  . Diverticulosis of large intestine 05/04/2007  . Hypothyroidism 05/03/2007  . Anxiety state 05/03/2007  . Hemorrhoids 05/03/2007   Social History   Tobacco Use  . Smoking status: Never Smoker  . Smokeless tobacco: Never Used  Substance Use Topics  . Alcohol use: No  . Drug use: No   Current Medications and Allergies:   Current Outpatient Medications:  .  amLODipine (NORVASC) 5 MG tablet, TAKE 1 TABLET(5 MG) BY MOUTH DAILY, Disp: 90 tablet, Rfl: 0 .  aspirin 81 MG tablet, Take 81 mg by mouth daily., Disp: , Rfl:  .  Calcium Carb-Cholecalciferol (CALTRATE 600+D) 600-800 MG-UNIT TABS, Take 1 tablet by mouth daily., Disp: , Rfl:  .  levothyroxine (SYNTHROID, LEVOTHROID) 75 MCG tablet, Take 1 tablet (75 mcg total) by mouth daily before breakfast., Disp: 90 tablet, Rfl: 0 .  LORazepam (ATIVAN) 1 MG tablet, TAKE 1/2 TO 1 TABLET BY MOUTH THREE TIMES DAILY AS NEEDED, Disp: 90 tablet, Rfl: 5 .  Magnesium Oxide (MAG-200 PO), Take 250 mg by mouth daily. , Disp: , Rfl:  .  Multiple Vitamins-Minerals (ICAPS AREDS 2 PO), Take 1 tablet by mouth daily., Disp: , Rfl:  .  Omega-3 Fatty Acids (FISH OIL) 1000 MG CAPS, Take by mouth., Disp: , Rfl:     Allergies  Allergen Reactions  . Alendronate Sodium     REACTION: pt states "blisters in my mouth"  . Penicillins Other (See Comments)    REACTION: fatigue/malaise, "feels extremely bad" Did PCN reaction causing immediate rash, facial/tongue/throat swelling, SOB or lightheadedness with hypotension: no Has patient had a PCN reaction causing severe rash involving mucus membranes or skin necrosis: no Has patient had a PCN  reaction that required hospitalization : no Has patient had a PCN reaction occurring within the last 10 years: yes If all of the above answers are "NO", then may proceed with Cephalosporin use. Extremely lethargic  . Raloxifene     REACTION: pt states "it dried me out"  . Risedronate Sodium     REACTION: pt states "teeth problems"  . Latex Rash   Review of Systems   Pertinent items are noted in the HPI. Otherwise, ROS is negative.  Vitals:   Vitals:   12/26/16 0856  BP: 118/72  Pulse: 76  Temp: 97.8 F (36.6 C)  TempSrc: Oral  SpO2: 93%  Weight: 150 lb (68 kg)     Body mass index is 23.49 kg/m. Physical Exam:   Physical Exam  Constitutional: She appears well-nourished.  HENT:  Head: Normocephalic and atraumatic.  Eyes: EOM are normal. Pupils are equal, round, and reactive to light.  Neck: Normal range of motion. Neck supple.  Cardiovascular: Normal rate, regular rhythm, normal heart sounds and intact distal pulses.  Pulmonary/Chest: Effort normal.  Abdominal: Soft.  Skin: Skin is warm.  Psychiatric: She has a normal mood and affect. Her behavior is normal.  Nursing note and vitals reviewed.   Results for orders placed or performed in visit on 11/14/16  CBC with Differential/Platelet  Result Value Ref Range   WBC 5.2 4.0 - 10.5 K/uL   RBC 4.47 3.87 - 5.11 Mil/uL   Hemoglobin 14.3 12.0 - 15.0 g/dL   HCT 45.442.7 09.836.0 - 11.946.0 %   MCV 95.5 78.0 - 100.0 fl   MCHC 33.5 30.0 - 36.0 g/dL   RDW 14.713.0 82.911.5 - 56.215.5 %   Platelets 200.0 150.0 - 400.0 K/uL   Neutrophils Relative % 56.3 43.0 - 77.0 %   Lymphocytes Relative 32.2 12.0 - 46.0 %   Monocytes Relative 8.0 3.0 - 12.0 %   Eosinophils Relative 2.1 0.0 - 5.0 %   Basophils Relative 1.4 0.0 - 3.0 %   Neutro Abs 2.9 1.4 - 7.7 K/uL   Lymphs Abs 1.7 0.7 - 4.0 K/uL   Monocytes Absolute 0.4 0.1 - 1.0 K/uL   Eosinophils Absolute 0.1 0.0 - 0.7 K/uL   Basophils Absolute 0.1 0.0 - 0.1 K/uL  Comprehensive metabolic panel  Result  Value Ref Range   Sodium 137 135 - 145 mEq/L   Potassium 4.5 3.5 - 5.1 mEq/L   Chloride 103 96 - 112 mEq/L   CO2 28 19 - 32 mEq/L   Glucose, Bld 107 (H) 70 - 99 mg/dL   BUN 18 6 - 23 mg/dL   Creatinine, Ser 1.300.80 0.40 - 1.20 mg/dL   Total Bilirubin 0.5 0.2 - 1.2 mg/dL   Alkaline Phosphatase 42 39 - 117 U/L   AST 14 0 - 37 U/L   ALT 10 0 - 35 U/L   Total Protein 6.5 6.0 -  8.3 g/dL   Albumin 4.1 3.5 - 5.2 g/dL   Calcium 9.7 8.4 - 16.1 mg/dL   GFR 09.60 >45.40 mL/min   Assessment and Plan:   Diagnoses and all orders for this visit:  Elevated glucose Comments: Normal A1c today. Glucometer provided. Orders: -     POCT glycosylated hemoglobin (Hb A1C)  Anxiety Comments: Encouraged her to call [facility] for therapy. Discussed decreasing the Lorazepam. Offered Buspar.   Essential hypertension Comments: Controlled at 1/2 dose daily.    . Reviewed expectations re: course of current medical issues. . Discussed self-management of symptoms. . Outlined signs and symptoms indicating need for more acute intervention. . Patient verbalized understanding and all questions were answered. Marland Kitchen Health Maintenance issues including appropriate healthy diet, exercise, and smoking avoidance were discussed with patient. . See orders for this visit as documented in the electronic medical record. . Patient received an After Visit Summary.  Helane Rima, DO [place] [place] 12/26/2016  No future appointments.

## 2016-12-26 NOTE — Progress Notes (Signed)
Pre visit review using our clinic review tool, if applicable. No additional management support is needed unless otherwise documented below in the visit note. 

## 2016-12-26 NOTE — Progress Notes (Signed)
Subjective:   Ashley JarvisLinda S [name] is a 76 y.o. female who presents for an Initial Medicare Annual Wellness Visit.  States she sleeps well until 3am. Wakes up feeling anxious. States that she sometimes lacks energy.   Review of Systems    No ROS.  Medicare Wellness Visit. Additional risk factors are reflected in the social history.   Cardiac Risk Factors include: advanced age (>4455men, 70>65 women);hypertension     Objective:    Today's Vitals   12/26/16 0941  BP: 118/72  Pulse: 77  Resp: 16  SpO2: 98%  Weight: 150 lb (68 kg)  Height: 5\' 7"  (1.702 m)   Body mass index is 23.49 kg/m.   Current Medications (verified) Outpatient Encounter Medications as of 12/26/2016  Medication Sig  . amLODipine (NORVASC) 5 MG tablet TAKE 1 TABLET(5 MG) BY MOUTH DAILY  . aspirin 81 MG tablet Take 81 mg by mouth daily.  . Calcium Carb-Cholecalciferol (CALTRATE 600+D) 600-800 MG-UNIT TABS Take 1 tablet by mouth daily.  Marland Kitchen. levothyroxine (SYNTHROID, LEVOTHROID) 75 MCG tablet Take 1 tablet (75 mcg total) by mouth daily before breakfast.  . LORazepam (ATIVAN) 1 MG tablet TAKE 1/2 TO 1 TABLET BY MOUTH THREE TIMES DAILY AS NEEDED  . Magnesium Oxide (MAG-200 PO) Take 250 mg by mouth daily.   . Multiple Vitamins-Minerals (ICAPS AREDS 2 PO) Take 1 tablet by mouth daily.  . Omega-3 Fatty Acids (FISH OIL) 1000 MG CAPS Take by mouth.  . [DISCONTINUED] citalopram (CELEXA) 20 MG tablet Take 20 mg by mouth daily.   Facility-Administered Encounter Medications as of 12/26/2016  Medication  . 0.9 %  sodium chloride infusion    Allergies (verified) Alendronate sodium; Penicillins; Raloxifene; Risedronate sodium; and Latex   History: Past Medical History:  Diagnosis Date  . Anxiety state, unspecified   . Diverticulosis of colon (without mention of hemorrhage)   . Osteoporosis, unspecified   . Other chest pain   . Other malaise and fatigue   . Personal history of other malignant neoplasm of skin   .  Unspecified hemorrhoids without mention of complication   . Unspecified hypothyroidism    Past Surgical History:  Procedure Laterality Date  . COLONOSCOPY     Family History  Problem Relation Age of Onset  . Ovarian cancer Mother   . Pancreatic cancer Brother   . Diabetes Unknown   . Prostate cancer Brother    Social History   Occupational History  . Occupation: Retired  Tobacco Use  . Smoking status: Never Smoker  . Smokeless tobacco: Never Used  Substance and Sexual Activity  . Alcohol use: No  . Drug use: No  . Sexual activity: Not on file    Tobacco Counseling Counseling given: Not Answered   Activities of Daily Living In your present state of health, do you have any difficulty performing the following activities: 12/26/2016  Hearing? N  Vision? N  Difficulty concentrating or making decisions? N  Walking or climbing stairs? N  Dressing or bathing? N  Doing errands, shopping? N  Preparing Food and eating ? N  Using the Toilet? N  In the past six months, have you accidently leaked urine? Y  Do you have problems with loss of bowel control? N  Managing your Medications? N  Managing your Finances? N  Housekeeping or managing your Housekeeping? N  Some recent data might be hidden    Immunizations and Health Maintenance Immunization History  Administered Date(s) Administered  . H1N1 02/07/2008  . Influenza  Split 12/04/2010, 11/16/2011  . Influenza Whole 11/14/2008, 11/14/2009  . Influenza, High Dose Seasonal PF 11/20/2015, 11/25/2016  . Influenza,inj,Quad PF,6+ Mos 11/15/2014  . Influenza-Unspecified 10/15/2012, 11/16/2013  . Pneumococcal Conjugate-13 06/17/2014  . Pneumococcal Polysaccharide-23 05/16/2007  . Tdap 11/14/2004   Health Maintenance Due  Topic Date Due  . DEXA SCAN  05/11/2004  . TETANUS/TDAP  11/15/2014  . INFLUENZA VACCINE  09/14/2016    Patient Care Team: Ashley [name], Erica, DO as PCP - General (Family Medicine)  Indicate any recent  Medical Services you may have received from other than [facility] providers in the past year (date may be approximate).     Assessment:   This is a routine wellness examination for Ashley [name]. Physical assessment deferred to PCP.   Hearing/Vision screen  Hearing Screening   125Hz  250Hz  500Hz  1000Hz  2000Hz  3000Hz  4000Hz  6000Hz  8000Hz   Right ear:   40 40 40  40    Left ear:   40 40 40  40    Vision Screening Comments: Wears bifocals. Needs cataracts removed.   Dietary issues and exercise activities discussed: Current Exercise Habits: Home exercise routine, Type of exercise: walking, Time (Minutes): 60, Frequency (Times/Week): 3, Weekly Exercise (Minutes/Week): 180, Intensity: Moderate, Exercise limited by: None identified   Pt states that she will not feel hungry but when she starts to eat she feels hungry. This started after she went off of Citalopram. States she worries about her husbands diet. States eats a light breakfast, full lunch and light dinner.  Goals    None     Depression Screen PHQ 2/9 Scores 12/26/2016 06/23/2016 08/19/2013 08/15/2012  PHQ - 2 Score 0 0 0 0  PHQ- 9 Score 3 - - -    Fall Risk Fall Risk  12/26/2016 06/23/2016 08/19/2013 08/15/2012  Falls in the past year? No No No No    Cognitive Function: Ad8 score reviewed for issues:  Issues making decisions:no  Less interest in hobbies / activities: no  Repeats questions, stories (family complaining):no  Trouble using ordinary gadgets (microwave, computer, phone):no  Forgets the month or year: no  Mismanaging finances: no  Remembering appts:no  Daily problems with thinking and/or memory:no Ad8 score is=0          Screening Tests Health Maintenance  Topic Date Due  . DEXA SCAN  05/11/2004  . TETANUS/TDAP  11/15/2014  . INFLUENZA VACCINE  09/14/2016  . PNA vac Low Risk Adult  Completed      Plan:   Follow up with PCP as directed.  I have personally reviewed and noted the following in the patient's  chart:   . Medical and social history . Use of alcohol, tobacco or illicit drugs  . Current medications and supplements . Functional ability and status . Nutritional status . Physical activity . Advanced directives . List of other physicians . Vitals . Screenings to include cognitive, depression, and falls . Referrals and appointments  In addition, I have reviewed and discussed with patient certain preventive protocols, quality metrics, and best practice recommendations. A written personalized care plan for preventive services as well as general preventive health recommendations were provided to patient.     Sherrye Payorassandra J Drummond, RN   12/26/2016

## 2016-12-26 NOTE — Progress Notes (Signed)
PCP notes:   Health maintenance: Dexa: Pt declined, states she will call office when she wants this ordered.  Abnormal screenings: None.   Patient concerns: None.   Nurse concerns: None.   Next PCP appt: NA.

## 2016-12-27 ENCOUNTER — Ambulatory Visit: Payer: Medicare Other

## 2016-12-27 DIAGNOSIS — Z7189 Other specified counseling: Secondary | ICD-10-CM

## 2016-12-27 NOTE — Progress Notes (Signed)
Patient was instructed on the proper use of her glucometer.  All questions were answered and before leaving, patient expressed that she felt comfortable with the procedure of checking her own blood sugar at home.

## 2016-12-31 ENCOUNTER — Other Ambulatory Visit: Payer: Self-pay | Admitting: Family Medicine

## 2016-12-31 DIAGNOSIS — E039 Hypothyroidism, unspecified: Secondary | ICD-10-CM

## 2017-01-02 ENCOUNTER — Telehealth: Payer: Self-pay

## 2017-01-02 NOTE — Telephone Encounter (Signed)
Patient calling with blood sugar readings after checking for 1 week.  States she did not check glucose on Monday.  Checked glucose in office on 11/13 and was not fasting.  Glucose was 108.  11/14, fasting, 130 11/15, fasting, 116 11/16, fasting, 125 11/17, fasting, 124 11/18, fasting, 110

## 2017-01-08 NOTE — Telephone Encounter (Signed)
Noted  

## 2017-01-11 ENCOUNTER — Telehealth: Payer: Self-pay | Admitting: Family Medicine

## 2017-01-11 DIAGNOSIS — E039 Hypothyroidism, unspecified: Secondary | ICD-10-CM

## 2017-01-11 NOTE — Telephone Encounter (Signed)
Okay referral since she requests it.

## 2017-01-11 NOTE — Telephone Encounter (Signed)
Copied from CRM [id number]. Topic: Quick Communication - See Telephone Encounter >> Jan 11, 2017 11:29 AM Diana EvesHoyt, Maryann B wrote: CRM for notification. See Telephone encounter for:  Seen on 11/12 for blood work and for weakness and low bp when standing. Pt is still experiencing the same symptoms. Pt is wanting to see an endocrinologist that her friend has suggested her to so she can get her thyroid checked out. And would like to make Dr Earlene PlaterWallace aware and to get her opinion on if this is good move.  Pt hasnt heard back from her blood sugar readings.  01/11/17.

## 2017-01-11 NOTE — Telephone Encounter (Signed)
Spoke with patient and she is wanting to know if she should go to Endocrinology to talk about her thyroid. She stated that she feels weak when she gets up and it takes her to mid morning before she starts to feel better in the afternoon. She thinks that it could be her thyroid. Do you want to place referral?

## 2017-01-12 NOTE — Telephone Encounter (Signed)
Notified patient that referral has been placed. 

## 2017-02-19 ENCOUNTER — Other Ambulatory Visit: Payer: Self-pay | Admitting: Pulmonary Disease

## 2017-02-20 ENCOUNTER — Ambulatory Visit: Payer: Self-pay | Admitting: *Deleted

## 2017-02-20 NOTE — Telephone Encounter (Signed)
  Reason for Disposition . [1] MODERATE dizziness (e.g., interferes with normal activities) AND [2] has been evaluated by physician for this  Answer Assessment - Initial Assessment Questions 1. DESCRIPTION: "Describe your dizziness."     Lightheaded and woozy 2. LIGHTHEADED: "Do you feel lightheaded?" (e.g., somewhat faint, woozy, weak upon standing)     Feels fine right now, sitting 3. VERTIGO: "Do you feel like either you or the room is spinning or tilting?" (i.e. vertigo)     no 4. SEVERITY: "How bad is it?"  "Do you feel like you are going to faint?" "Can you stand and walk?"   - MILD - walking normally   - MODERATE - interferes with normal activities (e.g., work, school)    - SEVERE - unable to stand, requires support to walk, feels like passing out now.      moderate 5. ONSET:  "When did the dizziness begin?"     When trying to get off the anti anxiety medication 6. AGGRAVATING FACTORS: "Does anything make it worse?" (e.g., standing, change in head position)     Walking around sometimes 7. HEART RATE: "Can you tell me your heart rate?" "How many beats in 15 seconds?"  (Note: not all patients can do this)       normal 8. CAUSE: "What do you think is causing the dizziness?"     ? Low vitamin B, or synthroid med 9. RECURRENT SYMPTOM: "Have you had dizziness before?" If so, ask: "When was the last time?" "What happened that time?"     no 10. OTHER SYMPTOMS: "Do you have any other symptoms?" (e.g., fever, chest pain, vomiting, diarrhea, bleeding)       Nauseated after eating, no vomiting and goes away around lunch time 11. PREGNANCY: "Is there any chance you are pregnant?" "When was your last menstrual period?"       no  Protocols used: DIZZINESS - LIGHTHEADEDNESS-A-AH  C/o dizziness. Wondered if it could be from needing a Vitamin B12 injection or thyroid level drawn.  Care advice given to patient.  Appointment made.

## 2017-02-20 NOTE — Telephone Encounter (Signed)
FYI

## 2017-02-20 NOTE — Telephone Encounter (Signed)
Please be advised.  °

## 2017-02-21 ENCOUNTER — Encounter: Payer: Self-pay | Admitting: Family Medicine

## 2017-02-21 ENCOUNTER — Ambulatory Visit (INDEPENDENT_AMBULATORY_CARE_PROVIDER_SITE_OTHER): Payer: Medicare Other

## 2017-02-21 ENCOUNTER — Ambulatory Visit: Payer: Medicare Other | Admitting: Family Medicine

## 2017-02-21 VITALS — BP 124/86 | HR 64 | Temp 97.3°F | Ht 67.0 in | Wt 150.4 lb

## 2017-02-21 DIAGNOSIS — M542 Cervicalgia: Secondary | ICD-10-CM

## 2017-02-21 DIAGNOSIS — R42 Dizziness and giddiness: Secondary | ICD-10-CM

## 2017-02-21 DIAGNOSIS — I10 Essential (primary) hypertension: Secondary | ICD-10-CM

## 2017-02-21 DIAGNOSIS — E039 Hypothyroidism, unspecified: Secondary | ICD-10-CM | POA: Diagnosis not present

## 2017-02-21 NOTE — Progress Notes (Signed)
Pilar JarvisLinda S Thaden is a 77 y.o. female here for an acute visit.  History of Present Illness:   Britt BottomJamie Wheeley CMA acting as scribe for Dr. Earlene PlaterWallace.  HPI: Patient comes in today for an acute visit. She is having some light headness that has been going on for over a month.   Light headiness: Patient wakes up in morning and takes her time getting up.She has some weakness in the legs and light headed that last until about lunch time. She has a little nausea so she took some Pepto bismol that did help the nausea. She takes the synthroid and that is when she starts having the symptoms. She does not eat much for breakfast. This is a chronic issue with the light headiness. She stated that the symptoms have gotten worse since she started having the neck pain. We will refer to neurosurgery to follow. We will order an X ray today. Will stop the Amlodipine today. If the blood pressure goes or starts having palpitations then go back on the Amlodipine.    Neck pian: Patient stated that she has been having neck pain since the snow. Since then she has had some neck pian that that radiates up into the scalp. She also feels weird in the neck area. When she takes Advil this helps the pain.   PMHx, SurgHx, SocialHx, Medications, and Allergies were reviewed in the Visit Navigator and updated as appropriate.  Current Medications:   .  amLODipine (NORVASC) 5 MG tablet, TAKE 1 TABLET(5 MG) BY MOUTH DAILY, Disp: 90 tablet, Rfl: 2 .  aspirin 81 MG tablet, Take 81 mg by mouth daily., Disp: , Rfl:  .  Calcium Carb-Cholecalciferol (CALTRATE 600+D) 600-800 MG-UNIT TABS, Take 1 tablet by mouth daily., Disp: , Rfl:  .  LORazepam (ATIVAN) 1 MG tablet, TAKE 1/2 TO 1 TABLET BY MOUTH THREE TIMES DAILY AS NEEDED, Disp: 90 tablet, Rfl: 5 .  Magnesium Oxide (MAG-200 PO), Take 250 mg by mouth daily. , Disp: , Rfl:  .  Multiple Vitamins-Minerals (ICAPS AREDS 2 PO), Take 1 tablet by mouth daily., Disp: , Rfl:  .  Omega-3 Fatty Acids (FISH  OIL) 1000 MG CAPS, Take by mouth., Disp: , Rfl:  .  SYNTHROID 75 MCG tablet, TAKE 1 TABLET BY MOUTH  DAILY BEFORE BREAKFAST, Disp: 90 tablet, Rfl: 1  Allergies  Allergen Reactions  . Alendronate Sodium     REACTION: pt states "blisters in my mouth"  . Penicillins Other (See Comments)    REACTION: fatigue/malaise, "feels extremely bad" Did PCN reaction causing immediate rash, facial/tongue/throat swelling, SOB or lightheadedness with hypotension: no Has patient had a PCN reaction causing severe rash involving mucus membranes or skin necrosis: no Has patient had a PCN reaction that required hospitalization : no Has patient had a PCN reaction occurring within the last 10 years: yes If all of the above answers are "NO", then may proceed with Cephalosporin use. Extremely lethargic  . Raloxifene     REACTION: pt states "it dried me out"  . Risedronate Sodium     REACTION: pt states "teeth problems"  . Latex Rash   Review of Systems:   Pertinent items are noted in the HPI. Otherwise, ROS is negative.  Vitals:   Vitals:   02/21/17 1416  BP: 124/86  Pulse: 64  Temp: (!) 97.3 F (36.3 C)  TempSrc: Oral  SpO2: 94%  Weight: 150 lb 6.4 oz (68.2 kg)  Height: 5\' 7"  (1.702 m)  Body mass index is 23.56 kg/m.  Physical Exam:   Physical Exam  Constitutional: She is oriented to person, place, and time. She appears well-developed and well-nourished.  HENT:  Head: Normocephalic and atraumatic.  Right Ear: External ear normal.  Left Ear: External ear normal.  Nose: Nose normal.  Mouth/Throat: Oropharynx is clear and moist.  Eyes: Conjunctivae and EOM are normal. Pupils are equal, round, and reactive to light.  Neck: Normal range of motion. Neck supple. No thyromegaly present.  Cardiovascular: Normal rate, regular rhythm and intact distal pulses.  Pulmonary/Chest: Effort normal and breath sounds normal.  Abdominal: Soft. Bowel sounds are normal.  Neurological: She is alert and  oriented to person, place, and time. She displays normal reflexes. No cranial nerve deficit or sensory deficit. She exhibits normal muscle tone. Coordination normal.  Skin: Skin is warm.  Psychiatric: She has a normal mood and affect. Her behavior is normal.  Nursing note and vitals reviewed.   Assessment and Plan:   1. Hypothyroidism, unspecified type The patient continues to experience lightheadedness each morning after taking her Levothyroxine. She wonders if it could be due to an abnormal TSH level. Will check today.  - T4, free - TSH  2. Neck pain Acute on chronic issue that is improving. Xray shows lots of degenerative changes. She wold like to hold on further intervention for now. I reassured her that I do not believe that the neck pain is causing her lightheadedness.   - DG Cervical Spine 2 or 3 views; Future  3. Essential hypertension  BP Readings from Last 3 Encounters:  02/24/17 120/82  02/21/17 124/86  12/26/16 118/72   Readings are stable. Home Bps are sometimes lower. With am lightheadedness, okay to either hold the Norvasc or take 1/2 daily.  - CBC with Differential/Platelet - Comprehensive metabolic panel  4. Lightheadeness Each am. Thought previously to be due to weaning the SSRI. Reviewed importance of hydration. Will check labs. If not improving and labs WNL, will pursue further work-up.   - CBC with Differential/Platelet - Comprehensive metabolic panel - Vitamin B12   . Reviewed expectations re: course of current medical issues. . Discussed self-management of symptoms. . Outlined signs and symptoms indicating need for more acute intervention. . Patient verbalized understanding and all questions were answered. Marland Kitchen Health Maintenance issues including appropriate healthy diet, exercise, and smoking avoidance were discussed with patient. . See orders for this visit as documented in the electronic medical record. . Patient received an After Visit  Summary.  CMA served as Neurosurgeon during this visit. History, Physical, and Plan performed by medical provider. The above documentation has been reviewed and is accurate and complete. Helane Rima, D.O.  Helane Rima, DO [place], [place] [facility] 02/25/2017

## 2017-02-22 ENCOUNTER — Telehealth: Payer: Self-pay | Admitting: Family Medicine

## 2017-02-22 LAB — COMPREHENSIVE METABOLIC PANEL
ALT: 11 U/L (ref 0–35)
AST: 13 U/L (ref 0–37)
Albumin: 4.3 g/dL (ref 3.5–5.2)
Alkaline Phosphatase: 46 U/L (ref 39–117)
BUN: 13 mg/dL (ref 6–23)
CO2: 29 mEq/L (ref 19–32)
Calcium: 9.6 mg/dL (ref 8.4–10.5)
Chloride: 101 mEq/L (ref 96–112)
Creatinine, Ser: 0.69 mg/dL (ref 0.40–1.20)
GFR: 87.5 mL/min (ref >60.00)
Glucose, Bld: 100 mg/dL — ABNORMAL HIGH (ref 70–99)
Potassium: 4.3 mEq/L (ref 3.5–5.1)
Sodium: 139 mEq/L (ref 135–145)
Total Bilirubin: 0.6 mg/dL (ref 0.2–1.2)
Total Protein: 6.6 g/dL (ref 6.0–8.3)

## 2017-02-22 LAB — CBC WITH DIFFERENTIAL/PLATELET
Basophils Absolute: 0.1 10*3/uL (ref 0.0–0.1)
Basophils Relative: 1.1 % (ref 0.0–3.0)
Eosinophils Absolute: 0 10*3/uL (ref 0.0–0.7)
Eosinophils Relative: 0.5 % (ref 0.0–5.0)
HCT: 42.2 % (ref 36.0–46.0)
Hemoglobin: 14.1 g/dL (ref 12.0–15.0)
Lymphocytes Relative: 26.2 % (ref 12.0–46.0)
Lymphs Abs: 1.5 10*3/uL (ref 0.7–4.0)
MCHC: 33.4 g/dL (ref 30.0–36.0)
MCV: 95.7 fl (ref 78.0–100.0)
Monocytes Absolute: 0.4 10*3/uL (ref 0.1–1.0)
Monocytes Relative: 7.5 % (ref 3.0–12.0)
Neutro Abs: 3.6 10*3/uL (ref 1.4–7.7)
Neutrophils Relative %: 64.7 % (ref 43.0–77.0)
Platelets: 221 10*3/uL (ref 150.0–400.0)
RBC: 4.41 Mil/uL (ref 3.87–5.11)
RDW: 13.2 % (ref 11.5–15.5)
WBC: 5.6 10*3/uL (ref 4.0–10.5)

## 2017-02-22 LAB — VITAMIN B12: Vitamin B-12: 836 pg/mL (ref 211–911)

## 2017-02-22 LAB — TSH: TSH: 2.69 u[IU]/mL (ref 0.35–4.50)

## 2017-02-22 LAB — T4, FREE: Free T4: 1.07 ng/dL (ref 0.60–1.60)

## 2017-02-22 NOTE — Telephone Encounter (Signed)
If the appointment is for hypothyroidism, she may cancel that appointment. Last labs are normal. Will send more information with test results.

## 2017-02-22 NOTE — Telephone Encounter (Signed)
Do you want the patient to cancel this appointment?

## 2017-02-22 NOTE — Telephone Encounter (Signed)
Please call patient to advise

## 2017-02-22 NOTE — Telephone Encounter (Signed)
Copied from CRM [phone]. Topic: General - Other >> Feb 22, 2017  9:52 AM Cecelia ByarsGreen, Amely Voorheis L, RMA wrote: Reason for CRM: patient calling to notify Dr. Earlene PlaterWallace has appt on Monday with an Endocrinologist and pt wants to know if Dr. Earlene PlaterWallace wants her to cancel that appt, please call pt with answer

## 2017-02-23 ENCOUNTER — Telehealth: Payer: Self-pay | Admitting: Family Medicine

## 2017-02-23 ENCOUNTER — Ambulatory Visit: Payer: Self-pay

## 2017-02-23 NOTE — Telephone Encounter (Signed)
Copied from CRM #[id number]. Topic: General - Other >> Feb 23, 2017 10:32 AM Maxmillian Carsey L, NT wrote: Reason for CRM:Patient called and said she was light headed this morning , she had black stools and says she takes aspirin called  [phone] this may be the cause ,says she wants to make the practice aware before her lab results come in, She also said she feels fine that was early this morning and she has left home   

## 2017-02-23 NOTE — Telephone Encounter (Signed)
Copied from CRM [id number]. Topic: General - Other >> Feb 23, 2017 10:32 AM Ashley [name], Ashley [name], NT wrote: Reason for JWJ:[id number]:Patient called and said she was light headed this morning , she had black stools and says she takes aspirin called  [id number] this may be the cause ,says she wants to make the practice aware before her lab results come in, She also said she feels fine that was early this morning and she has left home

## 2017-02-23 NOTE — Telephone Encounter (Signed)
Left message on patients phone that if appointment was for Hypothyroid then she could cancel. I left our phone so if the patient has any questions.

## 2017-02-23 NOTE — Telephone Encounter (Signed)
FYI

## 2017-02-23 NOTE — Telephone Encounter (Signed)
Just wanted to make your office aware.   Did not triage due to not being home.

## 2017-02-23 NOTE — Telephone Encounter (Signed)
Patient called in with c/o "black stool this morning." She says she went to the BR this morning and her first BM was normal. She went back and wiped and saw black on the tissue, she looked in the toilet and it was black, moderate amount of soft stool, no red seen. I asked was she constipated, she says she take senokot every night and it was soft this morning. She denies abdominal cramping, diarrhea. She says she is on aspirin daily and wondered if this was because of the aspirin. She reports being lightheaded this morning, but says this is something that has been going on and the doctor is trying to figure it out. She denies lightheadedness at this present time. According to the protocol, see PCP within 2 weeks, appointment made for 02/24/17, care advice given, told patient to hold off on taking aspirin until she is seen by the provider, she verbalized understanding.   Reason for Disposition . Age > 50 years  Answer Assessment - Initial Assessment Questions 1. APPEARANCE of BLOOD: "What color is it?" "Is it passed separately, on the surface of the stool, or mixed in with the stool?"      Black, no bright red blood 2. AMOUNT: "How much blood was passed?"      Not bright red 3. FREQUENCY: "How many times has blood been passed with the stools?"      1 time this morning 4. ONSET: "When was the blood first seen in the stools?" (Days or weeks)      Today 5. DIARRHEA: "Is there also some diarrhea?" If so, ask: "How many diarrhea stools were passed in past 24 hours?"      Denies 6. CONSTIPATION: "Do you have constipation?" If so, "How bad is it?"     Yes, but I take senokot at bedtime and today is soft 7. RECURRENT SYMPTOMS: "Have you had blood in your stools before?" If so, ask: "When was the last time?" and "What happened that time?"      No 8. BLOOD THINNERS: "Do you take any blood thinners?" (e.g., Coumadin/warfarin, Pradaxa/dabigatran, aspirin)     Aspirin 81 mg daily 9. OTHER SYMPTOMS: "Do you  have any other symptoms?"  (e.g., abdominal pain, vomiting, dizziness, fever)    Lightheadedness before going to bathroom this morning, weakness 10. PREGNANCY: "Is there any chance you are pregnant?" "When was your last menstrual period?"      No  Protocols used: RECTAL BLEEDING-A-AH

## 2017-02-23 NOTE — Telephone Encounter (Signed)
I think that she has been taking Pepto Bismol. Please confirm.

## 2017-02-24 ENCOUNTER — Ambulatory Visit: Payer: Medicare Other | Admitting: Family Medicine

## 2017-02-24 ENCOUNTER — Encounter: Payer: Self-pay | Admitting: Family Medicine

## 2017-02-24 VITALS — BP 120/82 | HR 74 | Temp 97.4°F | Wt 149.4 lb

## 2017-02-24 DIAGNOSIS — R1013 Epigastric pain: Secondary | ICD-10-CM

## 2017-02-24 DIAGNOSIS — R195 Other fecal abnormalities: Secondary | ICD-10-CM | POA: Diagnosis not present

## 2017-02-24 DIAGNOSIS — R42 Dizziness and giddiness: Secondary | ICD-10-CM | POA: Diagnosis not present

## 2017-02-24 MED ORDER — OMEPRAZOLE 20 MG PO CPDR: 20.0000 mg | DELAYED_RELEASE_CAPSULE | Freq: Every day | ORAL | 3 refills | Status: DC

## 2017-02-24 NOTE — Progress Notes (Addendum)
Ashley [name] is a 77 y.o. female is here for follow up.  History of Present Illness:   HPI: See Assessment and Plan section for Problem Based Charting of issues discussed today.  There are no preventive care reminders to display for this patient.   Depression screen [facility] 2/9 12/26/2016 06/23/2016 08/19/2013  Decreased Interest 0 0 0  Down, Depressed, Hopeless 0 0 0  PHQ - 2 Score 0 0 0  Altered sleeping 3 - -  Tired, decreased energy 0 - -  Change in appetite 0 - -  Feeling bad or failure about yourself  0 - -  Trouble concentrating 0 - -  Moving slowly or fidgety/restless 0 - -  Suicidal thoughts 0 - -  PHQ-9 Score 3 - -  Difficult doing work/chores Somewhat difficult - -   PMHx, SurgHx, SocialHx, FamHx, Medications, and Allergies were reviewed in the Visit Navigator and updated as appropriate.   Patient Active Problem List   Diagnosis Date Noted  . Encounter for glucometer instruction 12/27/2016  . 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 IFG (impaired fasting glucose) 06/16/2016  . Essential hypertension 06/17/2015  . H/O mixed hyperlipidemia 06/17/2015  . Dysthymia 06/17/2015  . Vitamin D deficiency 06/17/2014  . Osteopenia 08/19/2013  . Diverticulosis of large intestine 05/04/2007  . Hypothyroidism 05/03/2007  . Anxiety state 05/03/2007  . Hemorrhoids 05/03/2007   Social History   Tobacco Use  . Smoking status: Never Smoker  . Smokeless tobacco: Never Used  Substance Use Topics  . Alcohol use: No  . Drug use: No   Current Medications and Allergies:   .  amLODipine (NORVASC) 5 MG tablet, TAKE 1 TABLET(5 MG) BY MOUTH DAILY, Disp: 90 tablet, Rfl: 2 .  aspirin 81 MG tablet, Take 81 mg by mouth daily., Disp: , Rfl:  .  Calcium Carb-Cholecalciferol (CALTRATE 600+D) 600-800 MG-UNIT TABS, Take 1 tablet by mouth daily., Disp: , Rfl:  .  LORazepam (ATIVAN) 1 MG tablet, TAKE 1/2 TO 1 TABLET BY MOUTH THREE TIMES DAILY AS NEEDED, Disp: 90 tablet, Rfl: 5 .   Magnesium Oxide (MAG-200 PO), Take 250 mg by mouth daily. , Disp: , Rfl:  .  Multiple Vitamins-Minerals (ICAPS AREDS 2 PO), Take 1 tablet by mouth daily., Disp: , Rfl:  .  Omega-3 Fatty Acids (FISH OIL) 1000 MG CAPS, Take by mouth., Disp: , Rfl:  .  SYNTHROID 75 MCG tablet, TAKE 1 TABLET BY MOUTH  DAILY BEFORE BREAKFAST, Disp: 90 tablet, Rfl: 1  Allergies  Allergen Reactions  . Alendronate Sodium     REACTION: pt states "blisters in my mouth"  . Penicillins Other (See Comments)    REACTION: fatigue/malaise, "feels extremely bad" Did PCN reaction causing immediate rash, facial/tongue/throat swelling, SOB or lightheadedness with hypotension: no Has patient had a PCN reaction causing severe rash involving mucus membranes or skin necrosis: no Has patient had a PCN reaction that required hospitalization : no Has patient had a PCN reaction occurring within the last 10 years: yes If all of the above answers are "NO", then may proceed with Cephalosporin use. Extremely lethargic  . Raloxifene     REACTION: pt states "it dried me out"  . Risedronate Sodium     REACTION: pt states "teeth problems"  . Latex Rash   Review of Systems   Pertinent items are noted in the HPI. Otherwise, ROS is negative.  Vitals:   Vitals:   02/24/17 1020  BP: 120/82  Pulse: 74  Temp: Marland Kitchen)  97.4 F (36.3 C)  TempSrc: Oral  SpO2: 96%  Weight: 149 lb 6.4 oz (67.8 kg)     Body mass index is 23.4 kg/m.   Physical Exam:   Physical Exam  Constitutional: She appears well-nourished.  HENT:  Head: Normocephalic and atraumatic.  Eyes: EOM are normal. Pupils are equal, round, and reactive to light.  Neck: Normal range of motion. Neck supple.  Cardiovascular: Normal rate, regular rhythm, normal heart sounds and intact distal pulses.  Pulmonary/Chest: Effort normal.  Abdominal: Soft.  Skin: Skin is warm.  Psychiatric: She has a normal mood and affect. Her behavior is normal.  Nursing note and vitals reviewed.    Results for orders placed or performed in visit on 02/21/17  T4, free  Result Value Ref Range   Free T4 1.07 0.60 - 1.60 ng/dL  TSH  Result Value Ref Range   TSH 2.69 0.35 - 4.50 uIU/mL  CBC with Differential/Platelet  Result Value Ref Range   WBC 5.6 4.0 - 10.5 K/uL   RBC 4.41 3.87 - 5.11 Mil/uL   Hemoglobin 14.1 12.0 - 15.0 g/dL   HCT 16.142.2 09.636.0 - 04.546.0 %   MCV 95.7 78.0 - 100.0 fl   MCHC 33.4 30.0 - 36.0 g/dL   RDW 40.913.2 81.111.5 - 91.415.5 %   Platelets 221.0 150.0 - 400.0 K/uL   Neutrophils Relative % 64.7 43.0 - 77.0 %   Lymphocytes Relative 26.2 12.0 - 46.0 %   Monocytes Relative 7.5 3.0 - 12.0 %   Eosinophils Relative 0.5 0.0 - 5.0 %   Basophils Relative 1.1 0.0 - 3.0 %   Neutro Abs 3.6 1.4 - 7.7 K/uL   Lymphs Abs 1.5 0.7 - 4.0 K/uL   Monocytes Absolute 0.4 0.1 - 1.0 K/uL   Eosinophils Absolute 0.0 0.0 - 0.7 K/uL   Basophils Absolute 0.1 0.0 - 0.1 K/uL  Comprehensive metabolic panel  Result Value Ref Range   Sodium 139 135 - 145 mEq/L   Potassium 4.3 3.5 - 5.1 mEq/L   Chloride 101 96 - 112 mEq/L   CO2 29 19 - 32 mEq/L   Glucose, Bld 100 (H) 70 - 99 mg/dL   BUN 13 6 - 23 mg/dL   Creatinine, Ser 7.820.69 0.40 - 1.20 mg/dL   Total Bilirubin 0.6 0.2 - 1.2 mg/dL   Alkaline Phosphatase 46 39 - 117 U/L   AST 13 0 - 37 U/L   ALT 11 0 - 35 U/L   Total Protein 6.6 6.0 - 8.3 g/dL   Albumin 4.3 3.5 - 5.2 g/dL   Calcium 9.6 8.4 - 95.610.5 mg/dL   GFR 21.3087.50 >86.57>60.00 mL/min  Vitamin B12  Result Value Ref Range   Vitamin B-12 836 211 - 911 pg/mL   Assessment and Plan:   Ashley [name] was seen today for melena.  Diagnoses and all orders for this visit:  Dark stools Comments: Patient complained of 1 day of dark stools.  This is after taking Pepto-Bismol nearly daily for 2-3 days.  This has stopped already.  She is low risk for a bleed based on her recent and current symptoms.  Her recent CBC was completely normal.  I did offer to draw another CBC and to get stool cards for fecal occult blood.   Patient declines today.  I am okay with this.  Lightheadedness Comments: This is the major issue that is ongoing.  At first, I believed that it was due to weaning her SSRI.  However, he continues every  morning.  Her usual complaint is that she wakes up in the morning, takes her levothyroxine, goes downstairs and feels lightheaded.  She denies feeling of imbalance or of the room spinning.  She does feel as if she may about.  She never does.  This is never associated with chest pain, shortness of breath, headaches, vision changes, or other red flags.  It does seem to be worsened with standing from a seated position.  After discussing this at her last visit, we rechecked her thyroid levels which are normal.  Her CBC is also normal.  I gave her several options today.  The first is that she may try taking her levothyroxine at a different time of day, to see if this helps.  I also offered to refer to cardiology and/or order carotid Dopplers and an echo.  Patient is hesitant to go forward.  We discussed fall precautions and she will call when she is ready to proceed with further workup. Orders: -     Ambulatory referral to Cardiology  Epigastric discomfort Comments: This is likely due to taking medication on empty stomach.  The discomfort only seems to happen on in the morning after she takes her levothyroxine.  It goes away after taking Pepto-Bismol.  I have advised the patient to stop taking Pepto-Bismol and to start taking omeprazole.  We have also discussed altering her medication regimen as above. Orders: -     omeprazole (PRILOSEC) 20 MG capsule; Take 1 capsule (20 mg total) by mouth daily.   . Reviewed expectations re: course of current medical issues. . Discussed self-management of symptoms. . Outlined signs and symptoms indicating need for more acute intervention. . Patient verbalized understanding and all questions were answered. Marland Kitchen Health Maintenance issues including appropriate healthy diet,  exercise, and smoking avoidance were discussed with patient. . See orders for this visit as documented in the electronic medical record. . Patient received an After Visit Summary.  Helane Rima, DO [place], [place] 02/25/2017  Future Appointments  Date Time Provider Department Center  02/27/2017  3:30 PM Chrystie Nose, MD [facility]  04/21/2017 10:00 AM Helane Rima, DO [facility]

## 2017-02-24 NOTE — Telephone Encounter (Signed)
Printed and put with app PPW for today.

## 2017-02-27 ENCOUNTER — Ambulatory Visit: Payer: Medicare Other | Admitting: Internal Medicine

## 2017-03-06 ENCOUNTER — Telehealth: Payer: Self-pay | Admitting: Family Medicine

## 2017-03-06 ENCOUNTER — Ambulatory Visit: Payer: Self-pay | Admitting: *Deleted

## 2017-03-06 NOTE — Telephone Encounter (Signed)
Patient is having a hard time finding her pulse/BP in her left arm with her machine. She is concerned that her blood pressure is too high. Husband is helping her- machine still giving error reading. Patient states she would like Dr Earlene PlaterWallace to redo the heart referral for her- she had not scheduled it because she wanted to see how she did off medication. She states she is not doing well and she wants to be seen. (She did make an appointment with her husband's doctor- but it is too far out and she is worried about her left arm) Patient was given an appointment for tomorrow at office to see provider to check her BP and medication- she states if she does not need to be seen- the appointment can be canceled- she can be reached at [phone]. Patient is going to go up to [facility] to get BP checked- her machine was purchased there. She was given the high BP perimeters and will call if she is too elevated. Reason for Disposition . [1] Taking BP medications AND [2] feels is having side effects (e.g., impotence, cough, dizzy upon standing)  Answer Assessment - Initial Assessment Questions 1. BLOOD PRESSURE: "What is the blood pressure?" "Did you take at least two measurements 5 minutes apart?"   L  158/91 79      L - machine keeps giving error   R  137/82           R 126/86 P 82 2. ONSET: "When did you take your blood pressure?"     This morning 3. HOW: "How did you obtain the blood pressure?" (e.g., visiting nurse, automatic home BP monitor)     Automatic machine 4. HISTORY: "Do you have a history of high blood pressure?"     Moderate 5. MEDICATIONS: "Are you taking any medications for blood pressure?" "Have you missed any doses recently?"     Amlodipine 5 mg- today 1 tablet  6. OTHER SYMPTOMS: "Do you have any symptoms?" (e.g., headache, chest pain, blurred vision, difficulty breathing, weakness)       Shaky, nervious , drops things more, edgy 7. PREGNANCY: "Is there any chance you are pregnant?" "When was  your last menstrual period?"     n/a  Protocols used: HIGH BLOOD PRESSURE-A-AH

## 2017-03-06 NOTE — Telephone Encounter (Signed)
See note

## 2017-03-06 NOTE — Telephone Encounter (Signed)
The patient states she is returning a call from [place]. There is no CRM or phone note that I can find in reference to this. Please call patient back to advise.

## 2017-03-06 NOTE — Telephone Encounter (Signed)
Call in the am to check in. Okay referral to Cardiologist. Okay to be seen if still anxious. You may also offer nurse visit for BP check on our schedule so no copayment.

## 2017-03-06 NOTE — Telephone Encounter (Signed)
Called and l/m to call office. FYI Pt has app tomorrow with EW

## 2017-03-07 ENCOUNTER — Other Ambulatory Visit: Payer: Self-pay | Admitting: Family Medicine

## 2017-03-07 ENCOUNTER — Ambulatory Visit: Payer: Medicare Other | Admitting: Family Medicine

## 2017-03-07 NOTE — Telephone Encounter (Signed)
See other message for documentation.

## 2017-03-07 NOTE — Telephone Encounter (Signed)
Patient stated that she was feeling better this AM. She is scheduled to see you on Thursday. She stated that she is only having the symptoms in the morning times.

## 2017-03-08 NOTE — Telephone Encounter (Signed)
Patient is being seen tomorrow. Do you want to refill at appointment?

## 2017-03-08 NOTE — Telephone Encounter (Signed)
Okay either way.

## 2017-03-09 ENCOUNTER — Ambulatory Visit: Payer: Medicare Other | Admitting: Family Medicine

## 2017-03-09 ENCOUNTER — Encounter: Payer: Self-pay | Admitting: Family Medicine

## 2017-03-09 ENCOUNTER — Telehealth: Payer: Self-pay | Admitting: Family Medicine

## 2017-03-09 VITALS — BP 130/82 | HR 84 | Temp 97.8°F | Ht 67.0 in | Wt 148.6 lb

## 2017-03-09 DIAGNOSIS — R42 Dizziness and giddiness: Secondary | ICD-10-CM | POA: Diagnosis not present

## 2017-03-09 NOTE — Telephone Encounter (Signed)
Copied from CRM [id number]. Topic: Quick Communication - See Telephone Encounter >> Mar 09, 2017  3:31 PM Louie BunPalacios Medina, Rosey Batheresa D wrote: CRM for notification. See Telephone encounter for: 03/09/17. Patient called and would like to let [place]en know that the name is Accu check guide test strips and would like this called into her pharmacy [facility] [phone] - [place], [place] - [address] [facility] [address]. Patient also needs LORazepam (ATIVAN) 1 MG tablet called in. If there are any question to call her back.

## 2017-03-09 NOTE — Telephone Encounter (Signed)
Please advise 

## 2017-03-10 ENCOUNTER — Other Ambulatory Visit: Payer: Self-pay

## 2017-03-10 MED ORDER — GLUCOSE BLOOD VI STRP: ORAL_STRIP | 1 refills | Status: DC

## 2017-03-10 NOTE — Telephone Encounter (Signed)
Sent script to local pharmacy per patient.

## 2017-03-12 NOTE — Progress Notes (Signed)
Ashley [name] is a 77 y.o. female is here for follow up.  History of Present Illness:   HPI: See Assessment and Plan section for Problem Based Charting of issues discussed today.   There are no preventive care reminders to display for this patient. [facility] 12/26/2016 06/23/2016 08/19/2013  Decreased Interest 0 0 0  Down, Depressed, Hopeless 0 0 0  PHQ - 2 Score 0 0 0  Altered sleeping 3 - -  Tired, decreased energy 0 - -  Change in appetite 0 - -  Feeling bad or failure about yourself  0 - -  Trouble concentrating 0 - -  Moving slowly or fidgety/restless 0 - -  Suicidal thoughts 0 - -  PHQ-9 Score 3 - -  Difficult doing work/chores Somewhat difficult - -   PMHx, SurgHx, SocialHx, FamHx, Medications, and Allergies were reviewed in the Visit Navigator and updated as appropriate.   Patient Active Problem List   Diagnosis Date Noted  . Encounter for glucometer instruction 12/27/2016  . 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 IFG (impaired fasting glucose) 06/16/2016  . Essential hypertension 06/17/2015  . H/O mixed hyperlipidemia 06/17/2015  . Dysthymia 06/17/2015  . Vitamin D deficiency 06/17/2014  . Osteopenia 08/19/2013  . Diverticulosis of large intestine 05/04/2007  . Hypothyroidism 05/03/2007  . Anxiety state 05/03/2007  . Hemorrhoids 05/03/2007   Social History   Tobacco Use  . Smoking status: Never Smoker  . Smokeless tobacco: Never Used  Substance Use Topics  . Alcohol use: No  . Drug use: No   Current Medications and Allergies:   Current Outpatient Medications:  .  amLODipine (NORVASC) 5 MG tablet, TAKE 1 TABLET(5 MG) BY MOUTH DAILY, Disp: 90 tablet, Rfl: 2 .  aspirin 81 MG tablet, Take 81 mg by mouth daily., Disp: , Rfl:  .  Calcium Carb-Cholecalciferol (CALTRATE 600+D) 600-800 MG-UNIT TABS, Take 1 tablet by mouth daily., Disp: , Rfl:  .  Magnesium Oxide (MAG-200 PO), Take 250 mg by mouth daily. , Disp: , Rfl:  .  Multiple  Vitamins-Minerals (ICAPS AREDS 2 PO), Take 1 tablet by mouth daily., Disp: , Rfl:  .  Omega-3 Fatty Acids (FISH OIL) 1000 MG CAPS, Take by mouth., Disp: , Rfl:  .  omeprazole (PRILOSEC) 20 MG capsule, Take 1 capsule (20 mg total) by mouth daily., Disp: 30 capsule, Rfl: 3 .  SYNTHROID 75 MCG tablet, TAKE 1 TABLET BY MOUTH  DAILY BEFORE BREAKFAST, Disp: 90 tablet, Rfl: 1 .  glucose blood test strip, Use as instructed, Disp: 100 each, Rfl: 1 .  LORazepam (ATIVAN) 1 MG tablet, TAKE 1/2 TO 1 TABLET BY MOUTH THREE TIMES DAILY AS NEEDED, Disp: 90 tablet, Rfl: 0  Current Facility-Administered Medications:  .  0.9 %  sodium chloride infusion, 500 mL, Intravenous, Continuous, Louis MeckelKaplan, Robert D, MD   Allergies  Allergen Reactions  . Alendronate Sodium     REACTION: pt states "blisters in my mouth"  . Penicillins Other (See Comments)    REACTION: fatigue/malaise, "feels extremely bad" Did PCN reaction causing immediate rash, facial/tongue/throat swelling, SOB or lightheadedness with hypotension: no Has patient had a PCN reaction causing severe rash involving mucus membranes or skin necrosis: no Has patient had a PCN reaction that required hospitalization : no Has patient had a PCN reaction occurring within the last 10 years: yes If all of the above answers are "NO", then may proceed with Cephalosporin use. Extremely lethargic  . Raloxifene  REACTION: pt states "it dried me out"  . Risedronate Sodium     REACTION: pt states "teeth problems"  . Latex Rash   Review of Systems   Pertinent items are noted in the HPI. Otherwise, ROS is negative.  Vitals:   Vitals:   03/09/17 1043  BP: 130/82  Pulse: 84  Temp: 97.8 F (36.6 C)  TempSrc: Oral  SpO2: 97%  Weight: 148 lb 9.6 oz (67.4 kg)  Height: 5\' 7"  (1.702 m)     Body mass index is 23.27 kg/m.   Physical Exam:   Physical Exam  Constitutional: She appears well-nourished.  HENT:  Head: Normocephalic and atraumatic.  Eyes: EOM are  normal. Pupils are equal, round, and reactive to light.  Neck: Normal range of motion. Neck supple.  Cardiovascular: Normal rate, regular rhythm, normal heart sounds and intact distal pulses.  Pulmonary/Chest: Effort normal.  Abdominal: Soft.  Skin: Skin is warm.  Psychiatric: She has a normal mood and affect. Her behavior is normal.  Nursing note and vitals reviewed.   Assessment and Plan:   Babbie was seen today for hypertension.  Diagnoses and all orders for this visit:  Lightheadedness Comments: See last note. Patient continue to experience lightheadedness. She has tried altering the timing of her Levothyroxine without change.  New plan: When she wakes, she will check her fasting BG. She will eat and wait 15 minutes prior to getting out of bed. We will see if this eliminates lightheadedness (due to hypoglycemia).   . Reviewed expectations re: course of current medical issues. . Discussed self-management of symptoms. . Outlined signs and symptoms indicating need for more acute intervention. . Patient verbalized understanding and all questions were answered. Marland Kitchen Health Maintenance issues including appropriate healthy diet, exercise, and smoking avoidance were discussed with patient. . See orders for this visit as documented in the electronic medical record. . Patient received an After Visit Summary.  Helane Rima, DO [place], [place] 03/12/2017  Future Appointments  Date Time Provider Department Center  03/24/2017 11:00 AM Helane Rima, DO [facility]  04/21/2017 10:00 AM Helane Rima, DO [facility]

## 2017-03-20 ENCOUNTER — Telehealth: Payer: Self-pay | Admitting: Family Medicine

## 2017-03-20 NOTE — Telephone Encounter (Signed)
Spoke with patient and she stated that she is still having the dizzy spells that last half the day. She has been doing some research and she thinks that she has serotonin syndrome. She is wanting to know if she needs to stay on her Synthroid? She has an appointment with you on Friday. Her daughter will be coming with her.

## 2017-03-20 NOTE — Telephone Encounter (Signed)
Copied from CRM [id number]. Topic: General - Other >> Mar 20, 2017 10:35 AM [phone] SpanishKennedy, Cheryl W wrote:  Pt would like a callback form Dr Earlene PlaterWallace nurse would not give me any details about the call back   [phone]  >> Mar 20, 2017 10:43 AM Percival SpanishKennedy, Cheryl W wrote:   Pt said she also need the following med refill pharmacy never received  LORazepam (ATIVAN) 1 MG tablet

## 2017-03-21 NOTE — Telephone Encounter (Signed)
I just don't think that this is serotonin syndrome.  Please offer referral to Endocrine. I would like another set of eyes on her.

## 2017-03-22 MED ORDER — BLOOD GLUCOSE MONITOR KIT: PACK | 0 refills | Status: DC

## 2017-03-22 NOTE — Telephone Encounter (Signed)
Copied from [place] [id number]. Topic: General - Other >> Mar 22, 2017 11:07 AM Lolita Rieger, RMA wrote: Reason for CRM: pt would like a call from the nurse she needs the name of the kit that was discussed earlier this week Please call pt [phone]

## 2017-03-22 NOTE — Telephone Encounter (Signed)
See note

## 2017-03-22 NOTE — Telephone Encounter (Signed)
Spoke with Ashley [name] and we had already placed a referral to Endocrine to see Ashley [name]. Patient had canceled that appointment. She is going to call back to see if she can reschedule. If not We will place referral again on Friday at appointment.  Patient is wanting a new glucometer kit sent to the pharmacy. I will send in.  Patient is wanting to know if she can get lab work done to check for to much iron in the blood. I told her that Dr. Juleen China would discuss at appointment on Friday.

## 2017-03-24 ENCOUNTER — Encounter: Payer: Self-pay | Admitting: Family Medicine

## 2017-03-24 ENCOUNTER — Ambulatory Visit: Payer: Medicare Other | Admitting: Family Medicine

## 2017-03-24 VITALS — BP 130/80 | HR 77 | Temp 97.4°F | Wt 149.0 lb

## 2017-03-24 DIAGNOSIS — F419 Anxiety disorder, unspecified: Secondary | ICD-10-CM | POA: Diagnosis not present

## 2017-03-24 DIAGNOSIS — R42 Dizziness and giddiness: Secondary | ICD-10-CM

## 2017-03-24 DIAGNOSIS — E039 Hypothyroidism, unspecified: Secondary | ICD-10-CM

## 2017-03-24 MED ORDER — LORAZEPAM 1 MG PO TABS: 0.5000 mg | ORAL_TABLET | Freq: Three times a day (TID) | ORAL | 0 refills | Status: DC | PRN

## 2017-03-24 MED FILL — LORazepam 1 MG TABS: 1 | 30 days supply | Qty: 90 | Fill #0

## 2017-03-24 NOTE — Progress Notes (Signed)
Ashley [name] is a 77 y.o. female is here for follow up.  History of Present Illness:   HPI: See previous note. Feels improved with eating in the morning. Not checking BG. No new symptoms. Brought daughter today - professional patient advocate. Spent 25 minutes going over history and current thoughts.  There are no preventive care reminders to display for this patient. Depression screen [facility] 2/9 12/26/2016 06/23/2016 08/19/2013  Decreased Interest 0 0 0  Down, Depressed, Hopeless 0 0 0  PHQ - 2 Score 0 0 0  Altered sleeping 3 - -  Tired, decreased energy 0 - -  Change in appetite 0 - -  Feeling bad or failure about yourself  0 - -  Trouble concentrating 0 - -  Moving slowly or fidgety/restless 0 - -  Suicidal thoughts 0 - -  PHQ-9 Score 3 - -  Difficult doing work/chores Somewhat difficult - -   PMHx, SurgHx, SocialHx, FamHx, Medications, and Allergies were reviewed in the Visit Navigator and updated as appropriate.   Patient Active Problem List   Diagnosis Date Noted  . Encounter for glucometer instruction 12/27/2016  . 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 IFG (impaired fasting glucose) 06/16/2016  . Essential hypertension 06/17/2015  . H/O mixed hyperlipidemia 06/17/2015  . Dysthymia 06/17/2015  . Vitamin D deficiency 06/17/2014  . Osteopenia 08/19/2013  . Diverticulosis of large intestine 05/04/2007  . Hypothyroidism 05/03/2007  . Anxiety state 05/03/2007  . Hemorrhoids 05/03/2007   Social History   Tobacco Use  . Smoking status: Never Smoker  . Smokeless tobacco: Never Used  Substance Use Topics  . Alcohol use: No  . Drug use: No   Current Medications and Allergies:   .  amLODipine (NORVASC) 5 MG tablet, TAKE 1 TABLET(5 MG) BY MOUTH DAILY, Disp: 90 tablet, Rfl: 2 .  aspirin 81 MG tablet, Take 81 mg by mouth daily., Disp: , Rfl:  .  LORazepam (ATIVAN) 1 MG tablet, TAKE 1/2 TO 1 TABLET BY MOUTH THREE TIMES DAILY AS NEEDED, Disp: 90 tablet, Rfl:  0 .  Magnesium Oxide (MAG-200 PO), Take 250 mg by mouth daily. , Disp: , Rfl:  .  Multiple Vitamins-Minerals (ICAPS AREDS 2 PO), Take 1 tablet by mouth daily., Disp: , Rfl:  .  Omega-3 Fatty Acids (FISH OIL) 1000 MG CAPS, Take by mouth., Disp: , Rfl:  .  omeprazole (PRILOSEC) 20 MG capsule, Take 1 capsule (20 mg total) by mouth daily., Disp: 30 capsule, Rfl: 3 .  SYNTHROID 75 MCG tablet, TAKE 1 TABLET BY MOUTH  DAILY BEFORE BREAKFAST, Disp: 90 tablet, Rfl: 1    Allergies  Allergen Reactions  . Alendronate Sodium     REACTION: pt states "blisters in my mouth"  . Penicillins Other (See Comments)    REACTION: fatigue/malaise, "feels extremely bad" Did PCN reaction causing immediate rash, facial/tongue/throat swelling, SOB or lightheadedness with hypotension: no Has patient had a PCN reaction causing severe rash involving mucus membranes or skin necrosis: no Has patient had a PCN reaction that required hospitalization : no Has patient had a PCN reaction occurring within the last 10 years: yes If all of the above answers are "NO", then may proceed with Cephalosporin use. Extremely lethargic  . Raloxifene     REACTION: pt states "it dried me out"  . Risedronate Sodium     REACTION: pt states "teeth problems"  . Latex Rash   Review of Systems   Pertinent items are noted in the HPI.  Otherwise, ROS is negative.  Vitals:   Vitals:   03/24/17 1046  BP: 130/80  Pulse: 77  Temp: (!) 97.4 F (36.3 C)  TempSrc: Oral  SpO2: 95%  Weight: 149 lb (67.6 kg)     Body mass index is 23.34 kg/m.   Physical Exam:   Physical Exam  Constitutional: She appears well-nourished.  HENT:  Head: Normocephalic and atraumatic.  Eyes: EOM are normal. Pupils are equal, round, and reactive to light.  Neck: Normal range of motion. Neck supple.  Cardiovascular: Normal rate, regular rhythm, normal heart sounds and intact distal pulses.  Pulmonary/Chest: Effort normal.  Abdominal: Soft.  Skin: Skin is  warm.  Psychiatric: She has a normal mood and affect. Her behavior is normal.  Nursing note and vitals reviewed.   Assessment and Plan:   Ashley [name] was seen today for follow-up.  Diagnoses and all orders for this visit:  Acquired hypothyroidism Comments: To Endocrinology for scond opinion, but I do not think that this is related to thyroid function. Labs pending.  Orders: -     TSH -     T4, free -     Comprehensive metabolic panel -     T3 -     Thyroid peroxidase antibody  Anxiety Comments: Controlled with prn medication. Note: NOT related to lightheadedness. Orders: -     LORazepam (ATIVAN) 1 MG tablet; Take 0.5-1 tablets (0.5-1 mg total) by mouth 3 (three) times daily as needed.  Lightheadedness Comments: Improved somewhat with eating. To Endocrinology.   . Reviewed expectations re: course of current medical issues. . Discussed self-management of symptoms. . Outlined signs and symptoms indicating need for more acute intervention. . Patient verbalized understanding and all questions were answered. Marland Kitchen. Health Maintenance issues including appropriate healthy diet, exercise, and smoking avoidance were discussed with patient. . See orders for this visit as documented in the electronic medical record. . Patient received an After Visit Summary.  Helane RimaErica Jessiah Steinhart, DO [place], [facility] [place] 03/25/2017  Future Appointments  Date Time Provider Department Center  04/21/2017 10:00 AM Helane RimaWallace, Jaia Alonge, DO [facility]

## 2017-03-27 LAB — COMPREHENSIVE METABOLIC PANEL
AG Ratio: 1.6 (calc) (ref 1.0–2.5)
ALT: 17 U/L (ref 6–29)
AST: 16 U/L (ref 10–35)
Albumin: 4 g/dL (ref 3.6–5.1)
Alkaline phosphatase (APISO): 47 U/L (ref 33–130)
BUN: 17 mg/dL (ref 7–25)
CO2: 25 mmol/L (ref 20–32)
Calcium: 9.4 mg/dL (ref 8.6–10.4)
Chloride: 102 mmol/L (ref 98–110)
Creat: 0.75 mg/dL (ref 0.60–0.93)
Globulin: 2.5 g/dL (calc) (ref 1.9–3.7)
Glucose, Bld: 111 mg/dL — ABNORMAL HIGH (ref 65–99)
Potassium: 4 mmol/L (ref 3.5–5.3)
Sodium: 136 mmol/L (ref 135–146)
Total Bilirubin: 0.5 mg/dL (ref 0.2–1.2)
Total Protein: 6.5 g/dL (ref 6.1–8.1)

## 2017-03-27 LAB — THYROID PEROXIDASE ANTIBODY: Thyroperoxidase Ab SerPl-aCnc: 136 IU/mL — ABNORMAL HIGH (ref <9)

## 2017-03-27 LAB — TSH: TSH: 2.84 mIU/L (ref 0.40–4.50)

## 2017-03-27 LAB — T3: T3, Total: 82 ng/dL (ref 76–181)

## 2017-03-27 LAB — T4, FREE: Free T4: 1.4 ng/dL (ref 0.8–1.8)

## 2017-03-30 NOTE — Telephone Encounter (Signed)
Please advise.   Copied from CRM [id number]. Topic: Quick Communication - See Telephone Encounter >> Mar 30, 2017  2:31 PM Elliot GaultBell, Tiffany M wrote: CRM for notification. See Telephone encounter for:   Relation to pt: self  Call back number:[phone] [id number] [place] [address] [facility] [phone] (Phone) [phone])  Reason for call:  Patient experienced after taking LORazepam (ATIVAN) 1 MG tablet described as internal itching.  Patient consulted with her pharmacist and they advised  she should request Alprazolam from PCP, please advise  >> Mar 30, 2017  2:42 PM Elliot GaultBell, Tiffany M wrote:  Relation to pt: self  Call back number:[phone] [id number] [place] [address] [facility] [address] [address] [phone] (Phone) [phone])  Reason for call:  Patient experienced after taking LORazepam (ATIVAN) 1 MG tablet described as internal itching.  Patient consulted with her pharmacist and they advised  she should request Alprazolam from PCP, please advise

## 2017-03-30 NOTE — Addendum Note (Signed)
Addended by: Donnamarie PoagHOMPSON, JOELLEN Y on: 03/30/2017 11:50 AM   Modules accepted: Orders

## 2017-03-31 ENCOUNTER — Encounter: Payer: Self-pay | Admitting: *Deleted

## 2017-03-31 ENCOUNTER — Encounter: Payer: Self-pay | Admitting: Family Medicine

## 2017-03-31 NOTE — Telephone Encounter (Signed)
Please advise 

## 2017-03-31 NOTE — Telephone Encounter (Signed)
Pt reports "itching inside, like stinging or tingling." States occurred on different parts of body. No rash, no swallowing difficulty, no swelling of throat. States she took 1/2 tab at HS last night then awoken during night and took additional half. Still experienced "stinging."  Is requesting alternative medication. Pt sounded very anxious.  [phone]

## 2017-03-31 NOTE — Telephone Encounter (Signed)
More info please.  

## 2017-03-31 NOTE — Telephone Encounter (Signed)
This encounter was created in error - please disregard.

## 2017-03-31 NOTE — Telephone Encounter (Signed)
See note

## 2017-04-03 MED ORDER — ALPRAZOLAM 0.25 MG PO TABS: 0.2500 mg | ORAL_TABLET | Freq: Two times a day (BID) | ORAL | 0 refills | Status: DC | PRN

## 2017-04-03 NOTE — Telephone Encounter (Signed)
Xanax sent

## 2017-04-03 NOTE — Telephone Encounter (Signed)
Please advise 

## 2017-04-04 ENCOUNTER — Telehealth: Payer: Self-pay

## 2017-04-04 ENCOUNTER — Telehealth: Payer: Self-pay | Admitting: Surgical

## 2017-04-04 NOTE — Telephone Encounter (Signed)
Patient dropped off logs for you to review.

## 2017-04-04 NOTE — Telephone Encounter (Signed)
Notified patient of message.  Patient verbalized understanding.  

## 2017-04-04 NOTE — Telephone Encounter (Signed)
Left message for patient to return call to office. Per Dr. Earlene PlaterWallace she would like the patient to start Zantac 150 mg twice a day for stomach protection. Dr. Earlene PlaterWallace stated that her vitals looked good.

## 2017-04-04 NOTE — Telephone Encounter (Signed)
This encounter was created in error - please disregard.

## 2017-04-05 NOTE — Telephone Encounter (Signed)
This is documented in another note.

## 2017-04-05 NOTE — Telephone Encounter (Signed)
Put on your desk with notes.

## 2017-04-05 NOTE — Telephone Encounter (Signed)
Think you called pt yesterday about this

## 2017-04-13 ENCOUNTER — Ambulatory Visit: Payer: Medicare Other | Admitting: Interventional Cardiology

## 2017-04-17 ENCOUNTER — Ambulatory Visit: Payer: Medicare Other | Admitting: Interventional Cardiology

## 2017-04-18 ENCOUNTER — Ambulatory Visit: Payer: Medicare Other | Admitting: Internal Medicine

## 2017-04-21 ENCOUNTER — Ambulatory Visit: Payer: Medicare Other | Admitting: Family Medicine

## 2017-04-26 ENCOUNTER — Other Ambulatory Visit: Payer: Self-pay | Admitting: Family Medicine

## 2017-04-26 DIAGNOSIS — E039 Hypothyroidism, unspecified: Secondary | ICD-10-CM

## 2017-04-27 ENCOUNTER — Ambulatory Visit: Payer: Self-pay | Admitting: *Deleted

## 2017-04-27 NOTE — Telephone Encounter (Signed)
Pt reports fell on wet floor 3 days ago. Fell against door frame, hit right side of head "near top." Denies any LOC, small  amount of bleeding at that time, states resolved quickly, "small bump" remains. States son is EMT and did "thorough assessment"; pt went to ED but left due to wait time. No other injuries. Calls today to report "Top of head feels dull." Difficult for pt to describe; denies as headache,numbness. Oriented x 4, denies dizziness, nausea, vomiting; no visual changes, no somnolence. Is not on  anticoagulant.  By end of triage call pt states "my head is starting to feel better. I just wanted Dr. Earlene PlaterWallace to know about it."  Home care advise given.  Reason for Disposition . Scalp swelling, bruise or pain  Answer Assessment - Initial Assessment Questions 1. MECHANISM: "How did the injury happen?" For falls, ask: "What height did you fall from?" and "What surface did you fall against?"      Fell on wet floor 3 days ago 2. ONSET: "When did the injury happen?" (Minutes or hours ago)      04/24/17 3. NEUROLOGIC SYMPTOMS: "Was there any loss of consciousness?" "Are there any other neurological symptoms?"      No 4. MENTAL STATUS: "Does the person know who he is, who you are, and where he is?"      Yes 5. LOCATION: "What part of the head was hit?"      Right side of head," high up." 6. SCALP APPEARANCE: "What does the scalp look like? Is it bleeding now?" If so, ask: "Is it difficult to stop?"      Did bleed a small amount. 7. SIZE: For cuts, bruises, or swelling, ask: "How large is it?" (e.g., inches or centimeters)      Tiny bump 8. PAIN: "Is there any pain?" If so, ask: "How bad is it?"  (e.g., Scale 1-10; or mild, moderate, severe)     No 9. TETANUS: For any breaks in the skin, ask: "When was the last tetanus booster?"     N/A 10. OTHER SYMPTOMS: "Do you have any other symptoms?" (e.g., neck pain, vomiting)  Head feels "dull." Dull headache  Protocols used: HEAD INJURY-A-AH

## 2017-04-27 NOTE — Telephone Encounter (Signed)
Please advise 

## 2017-04-28 NOTE — Telephone Encounter (Signed)
Let's check in again on Monday.

## 2017-05-01 NOTE — Telephone Encounter (Signed)
Called patient she is feeling wonderful. No problems at all.

## 2017-05-03 ENCOUNTER — Telehealth: Payer: Self-pay | Admitting: Family Medicine

## 2017-05-03 NOTE — Telephone Encounter (Signed)
Called patient back she will call office and see if can be seen sooner. If she has any problems.

## 2017-05-03 NOTE — Telephone Encounter (Signed)
Called patient states that yesterday she had episode that lasted for about 30 minutes. She felt her heart rate go up, had some sob, and felt faint. She did some deep breathing and relaxation and it helped. She was worried that it may be a side effect of the synthroid. She spoke to her pharmacist last night and he agreed. Patient states that the BP are good she has checked several times her heart rate is around 97 to 90 each time.  She started having the same feelings this morning with the rapid heart rate and checked it was at 97 so she tod xanax and it went down to 93. She wanted to know if you felt like it may be the synthroid?

## 2017-05-03 NOTE — Telephone Encounter (Signed)
Patient's Healthcare POA Sue Lush(Andrea) came into office this afternoon to see about getting patient in with Endocrinology.  After speaking with her at length and looking at the clinical notes from today I called [facility] to see about getting the patient scheduled. The soonest they had available was 05/29/17, and she was unhappy with that appointment. She asked if I was able/willing to call another Endo office and see about getting the patient in sooner. After some length of speaking with her I called [facility] and I was able to get her scheduled on 05/04/17 at 1400.  Patient accepted the appointment and expressed thanks over our handling of the situation.  She stated she would call back with any more issues, and would let us know if everything went fine with the Endo appointment.

## 2017-05-03 NOTE — Telephone Encounter (Signed)
Copied from CRM [id number]. Topic: General - Other >> May 03, 2017  8:48 AM Gerrianne ScalePayne, Brittne Kawasaki L wrote: Reason for CRM: Patient complains of fast heart rate Monday and Tuesday of 90 and today with readings of 93 95 97 Monday and Tuesday she felt faint and blood rush to her head off/on today she's ok she states that she takes the Synthroid and Blood pressure medicine she thinks that its on of those medicines that's making her feel this way she states that she has Larazapam and want to know if she can take that to lower her heart rate  Reason for high priority pt nervous and want a call back asap

## 2017-05-03 NOTE — Telephone Encounter (Signed)
Based on the other labs, I don't think that it is the thyroid medication. She does need to see Endocrinology.

## 2017-05-03 NOTE — Telephone Encounter (Signed)
See note

## 2017-05-05 LAB — BASIC METABOLIC PANEL
BUN: 16 (ref 4–21)
Creatinine: 0.8 (ref 0.5–1.1)
Glucose: 113
Potassium: 4.5 (ref 3.4–5.3)
Sodium: 138 (ref 137–147)

## 2017-05-23 ENCOUNTER — Ambulatory Visit: Payer: Medicare Other | Admitting: Family Medicine

## 2017-05-29 ENCOUNTER — Ambulatory Visit: Payer: Medicare Other | Admitting: Endocrinology

## 2017-05-30 ENCOUNTER — Other Ambulatory Visit: Payer: Self-pay | Admitting: Internal Medicine

## 2017-05-30 ENCOUNTER — Telehealth: Payer: Self-pay | Admitting: *Deleted

## 2017-05-30 ENCOUNTER — Encounter (INDEPENDENT_AMBULATORY_CARE_PROVIDER_SITE_OTHER): Payer: Medicare Other

## 2017-05-30 DIAGNOSIS — R42 Dizziness and giddiness: Secondary | ICD-10-CM

## 2017-05-30 NOTE — Telephone Encounter (Signed)
Patient called.  They found the instruction manuals.

## 2017-06-16 ENCOUNTER — Ambulatory Visit: Payer: Medicare Other | Admitting: Family Medicine

## 2017-06-24 ENCOUNTER — Other Ambulatory Visit: Payer: Self-pay | Admitting: Family Medicine

## 2017-06-24 DIAGNOSIS — F419 Anxiety disorder, unspecified: Secondary | ICD-10-CM

## 2017-06-26 NOTE — Telephone Encounter (Signed)
Ok to refill 

## 2017-06-27 ENCOUNTER — Ambulatory Visit: Payer: Medicare Other | Admitting: Endocrinology

## 2017-07-09 ENCOUNTER — Encounter: Payer: Self-pay | Admitting: Family Medicine

## 2017-07-09 NOTE — Progress Notes (Deleted)
Ashley [name] is a 77 y.o. female is here for follow up.  History of Present Illness:   HPI:  There are no preventive care reminders to display for this patient. Depression screen [facility] 2/9 12/26/2016 06/23/2016 08/19/2013  Decreased Interest 0 0 0  Down, Depressed, Hopeless 0 0 0  PHQ - 2 Score 0 0 0  Altered sleeping 3 - -  Tired, decreased energy 0 - -  Change in appetite 0 - -  Feeling bad or failure about yourself  0 - -  Trouble concentrating 0 - -  Moving slowly or fidgety/restless 0 - -  Suicidal thoughts 0 - -  PHQ-9 Score 3 - -  Difficult doing work/chores Somewhat difficult - -   PMHx, SurgHx, SocialHx, FamHx, Medications, and Allergies were reviewed in the Visit Navigator and updated as appropriate.   Patient Active Problem List   Diagnosis Date Noted  . Encounter for glucometer instruction 12/27/2016  . 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 IFG (impaired fasting glucose) 06/16/2016  . Essential hypertension 06/17/2015  . H/O mixed hyperlipidemia 06/17/2015  . Dysthymia 06/17/2015  . Vitamin D deficiency 06/17/2014  . Osteopenia 08/19/2013  . Diverticulosis of large intestine 05/04/2007  . Hypothyroidism 05/03/2007  . Anxiety state 05/03/2007  . Hemorrhoids 05/03/2007   Social History   Tobacco Use  . Smoking status: Never Smoker  . Smokeless tobacco: Never Used  Substance Use Topics  . Alcohol use: No  . Drug use: No   Current Medications and Allergies:   Current Outpatient Medications:  .  ALPRAZolam (XANAX) 0.25 MG tablet, Take 1 tablet (0.25 mg total) by mouth 2 (two) times daily as needed for anxiety., Disp: 20 tablet, Rfl: 0 .  amLODipine (NORVASC) 5 MG tablet, TAKE 1 TABLET(5 MG) BY MOUTH DAILY, Disp: 90 tablet, Rfl: 2 .  aspirin 81 MG tablet, Take 81 mg by mouth daily., Disp: , Rfl:  .  blood glucose meter kit and supplies KIT, Dispense based on patient and insurance preference. Use up to four times daily as directed. (FOR ICD-9  250.00, 250.01)., Disp: 1 each, Rfl: 0 .  Calcium Carb-Cholecalciferol (CALTRATE 600+D) 600-800 MG-UNIT TABS, Take 1 tablet by mouth daily., Disp: , Rfl:  .  glucose blood test strip, Use as instructed, Disp: 100 each, Rfl: 1 .  LORazepam (ATIVAN) 1 MG tablet, TAKE 1/2 TO 1 TABLET BY MOUTH THREE TIMES DAILY AS NEEDED, Disp: 90 tablet, Rfl: 0 .  Magnesium Oxide (MAG-200 PO), Take 250 mg by mouth daily. , Disp: , Rfl:  .  Multiple Vitamins-Minerals (ICAPS AREDS 2 PO), Take 1 tablet by mouth daily., Disp: , Rfl:  .  Omega-3 Fatty Acids (FISH OIL) 1000 MG CAPS, Take by mouth., Disp: , Rfl:  .  omeprazole (PRILOSEC) 20 MG capsule, Take 1 capsule (20 mg total) by mouth daily., Disp: 30 capsule, Rfl: 3 .  SYNTHROID 75 MCG tablet, TAKE 1 TABLET BY MOUTH  DAILY BEFORE BREAKFAST, Disp: 90 tablet, Rfl: 1   Allergies  Allergen Reactions  . Alendronate Sodium     REACTION: pt states "blisters in my mouth"  . Penicillins Other (See Comments)    REACTION: fatigue/malaise, "feels extremely bad" Did PCN reaction causing immediate rash, facial/tongue/throat swelling, SOB or lightheadedness with hypotension: no Has patient had a PCN reaction causing severe rash involving mucus membranes or skin necrosis: no Has patient had a PCN reaction that required hospitalization : no Has patient had a PCN reaction occurring within the  last 10 years: yes If all of the above answers are "NO", then may proceed with Cephalosporin use. Extremely lethargic  . Raloxifene     REACTION: pt states "it dried me out"  . Risedronate Sodium     REACTION: pt states "teeth problems"  . Latex Rash   Review of Systems   Pertinent items are noted in the HPI. Otherwise, ROS is negative.  Vitals:  There were no vitals filed for this visit.   There is no height or weight on file to calculate BMI.  Physical Exam:   Physical Exam  Constitutional: She appears well-nourished.  HENT:  Head: Normocephalic and atraumatic.  Eyes:  Pupils are equal, round, and reactive to light. EOM are normal.  Neck: Normal range of motion. Neck supple.  Cardiovascular: Normal rate, regular rhythm, normal heart sounds and intact distal pulses.  Pulmonary/Chest: Effort normal.  Abdominal: Soft.  Skin: Skin is warm.  Psychiatric: She has a normal mood and affect. Her behavior is normal.  Nursing note and vitals reviewed.   Results for orders placed or performed in visit on 03/24/17  TSH  Result Value Ref Range   TSH 2.84 0.40 - 4.50 mIU/L  T4, free  Result Value Ref Range   Free T4 1.4 0.8 - 1.8 ng/dL  Comprehensive metabolic panel  Result Value Ref Range   Glucose, Bld 111 (H) 65 - 99 mg/dL   BUN 17 7 - 25 mg/dL   Creat 0.75 0.60 - 0.93 mg/dL   BUN/Creatinine Ratio NOT APPLICABLE 6 - 22 (calc)   Sodium 136 135 - 146 mmol/L   Potassium 4.0 3.5 - 5.3 mmol/L   Chloride 102 98 - 110 mmol/L   CO2 25 20 - 32 mmol/L   Calcium 9.4 8.6 - 10.4 mg/dL   Total Protein 6.5 6.1 - 8.1 g/dL   Albumin 4.0 3.6 - 5.1 g/dL   Globulin 2.5 1.9 - 3.7 g/dL (calc)   AG Ratio 1.6 1.0 - 2.5 (calc)   Total Bilirubin 0.5 0.2 - 1.2 mg/dL   Alkaline phosphatase (APISO) 47 33 - 130 U/L   AST 16 10 - 35 U/L   ALT 17 6 - 29 U/L  T3  Result Value Ref Range   T3, Total 82 76 - 181 ng/dL  Thyroid peroxidase antibody  Result Value Ref Range   Thyroperoxidase Ab SerPl-aCnc 136 (H) <9 IU/mL   Assessment and Plan:   There are no diagnoses linked to this encounter.  . Reviewed expectations re: course of current medical issues. . Discussed self-management of symptoms. . Outlined signs and symptoms indicating need for more acute intervention. . Patient verbalized understanding and all questions were answered. Marland Kitchen Health Maintenance issues including appropriate healthy diet, exercise, and smoking avoidance were discussed with patient. . See orders for this visit as documented in the electronic medical record. . Patient received an After Visit  Summary.  Briscoe Deutscher, DO [place] 07/09/2017  Future Appointments  Date Time Provider [place]  07/11/2017 10:40 AM Briscoe Deutscher, DO [facility]   CMA served as scribe during this visit. History, Physical, and Plan performed by medical provider. The above documentation has been reviewed and is accurate and complete. Briscoe Deutscher, D.O.

## 2017-07-11 ENCOUNTER — Ambulatory Visit: Payer: Medicare Other | Admitting: Family Medicine

## 2017-08-04 ENCOUNTER — Other Ambulatory Visit: Payer: Self-pay | Admitting: Family Medicine

## 2017-08-04 DIAGNOSIS — F419 Anxiety disorder, unspecified: Secondary | ICD-10-CM

## 2017-08-07 NOTE — Telephone Encounter (Signed)
Last o/v 04/03/17 #20 no refills  Last app 03/24/17 F/u 08/15/17 N/S 06/16/17 C/A 07/11/17

## 2017-08-15 ENCOUNTER — Ambulatory Visit: Payer: Medicare Other | Admitting: Family Medicine

## 2017-08-15 ENCOUNTER — Encounter: Payer: Self-pay | Admitting: Family Medicine

## 2017-08-15 VITALS — BP 128/80 | HR 73 | Temp 98.1°F | Ht 67.0 in | Wt 143.6 lb

## 2017-08-15 DIAGNOSIS — F411 Generalized anxiety disorder: Secondary | ICD-10-CM

## 2017-08-15 DIAGNOSIS — E063 Autoimmune thyroiditis: Secondary | ICD-10-CM

## 2017-08-15 DIAGNOSIS — E038 Other specified hypothyroidism: Secondary | ICD-10-CM

## 2017-08-15 DIAGNOSIS — I10 Essential (primary) hypertension: Secondary | ICD-10-CM

## 2017-08-15 DIAGNOSIS — R319 Hematuria, unspecified: Secondary | ICD-10-CM | POA: Diagnosis not present

## 2017-08-15 LAB — URINALYSIS, ROUTINE W REFLEX MICROSCOPIC
Bilirubin Urine: NEGATIVE
Hgb urine dipstick: NEGATIVE
Ketones, ur: NEGATIVE
Leukocytes, UA: NEGATIVE
Nitrite: NEGATIVE
RBC / HPF: NONE SEEN (ref 0–?)
Specific Gravity, Urine: 1.01 (ref 1.000–1.030)
Total Protein, Urine: NEGATIVE
Urine Glucose: NEGATIVE
Urobilinogen, UA: 0.2 (ref 0.0–1.0)
pH: 7.5 (ref 5.0–8.0)

## 2017-08-15 LAB — COMPREHENSIVE METABOLIC PANEL
ALT: 17 U/L (ref 0–35)
AST: 13 U/L (ref 0–37)
Albumin: 4.1 g/dL (ref 3.5–5.2)
Alkaline Phosphatase: 51 U/L (ref 39–117)
BUN: 17 mg/dL (ref 6–23)
CO2: 30 mEq/L (ref 19–32)
Calcium: 9.1 mg/dL (ref 8.4–10.5)
Chloride: 102 mEq/L (ref 96–112)
Creatinine, Ser: 0.7 mg/dL (ref 0.40–1.20)
GFR: 85.96 mL/min (ref >60.00)
Glucose, Bld: 94 mg/dL (ref 70–99)
Potassium: 4.1 mEq/L (ref 3.5–5.1)
Sodium: 137 mEq/L (ref 135–145)
Total Bilirubin: 0.5 mg/dL (ref 0.2–1.2)
Total Protein: 6.4 g/dL (ref 6.0–8.3)

## 2017-08-15 LAB — MAGNESIUM: Magnesium: 2 mg/dL (ref 1.5–2.5)

## 2017-08-15 LAB — LIPID PANEL
Cholesterol: 190 mg/dL (ref 0–200)
HDL: 81.6 mg/dL (ref >39.00)
LDL Cholesterol: 93 mg/dL (ref 0–99)
NonHDL: 108.51
Total CHOL/HDL Ratio: 2
Triglycerides: 76 mg/dL (ref 0.0–149.0)
VLDL: 15.2 mg/dL (ref 0.0–40.0)

## 2017-08-15 NOTE — Progress Notes (Signed)
Ashley [name] is a 77 y.o. female is here for follow up.  History of Present Illness:   HPI: Doing well. Now followed by Endocrinology. Had visit with Cardiology as well. Was offered Atenolol for palpitations. Took one of her husband's and felt tired. Has not chosen to do more. Deconditioned. Starting water aerobics at the [facility]. Like it. Recent UTI and had blood on UA.  There are no preventive care reminders to display for this patient. Depression screen [facility] 2/9 08/15/2017 12/26/2016 06/23/2016  Decreased Interest 2 0 0  Down, Depressed, Hopeless 0 0 0  PHQ - 2 Score 2 0 0  Altered sleeping 1 3 -  Tired, decreased energy 1 0 -  Change in appetite 0 0 -  Feeling bad or failure about yourself  0 0 -  Trouble concentrating 0 0 -  Moving slowly or fidgety/restless 0 0 -  Suicidal thoughts 0 0 -  PHQ-9 Score 4 3 -  Difficult doing work/chores Not difficult at all Somewhat difficult -   PMHx, SurgHx, SocialHx, FamHx, Medications, and Allergies were reviewed in the Visit Navigator and updated as appropriate.   Patient Active Problem List   Diagnosis Date Noted  . Encounter for glucometer instruction 12/27/2016  . 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 IFG (impaired fasting glucose) 06/16/2016  . Essential hypertension 06/17/2015  . H/O mixed hyperlipidemia 06/17/2015  . Dysthymia 06/17/2015  . Vitamin D deficiency 06/17/2014  . Osteopenia 08/19/2013  . Diverticulosis of large intestine 05/04/2007  . Hypothyroidism 05/03/2007  . Anxiety state 05/03/2007  . Hemorrhoids 05/03/2007   Social History   Tobacco Use  . Smoking status: Never Smoker  . Smokeless tobacco: Never Used  Substance Use Topics  . Alcohol use: No  . Drug use: No   Current Medications and Allergies:   Current Outpatient Medications:  .  ALPRAZolam (XANAX) 0.25 MG tablet, Take 1 tablet (0.25 mg total) by mouth 2 (two) times daily as needed for anxiety., Disp: 20 tablet, Rfl: 0 .  amLODipine  (NORVASC) 5 MG tablet, TAKE 1 TABLET(5 MG) BY MOUTH DAILY, Disp: 90 tablet, Rfl: 2 .  aspirin 81 MG tablet, Take 81 mg by mouth daily., Disp: , Rfl:  .  blood glucose meter kit and supplies KIT, Dispense based on patient and insurance preference. Use up to four times daily as directed. (FOR ICD-9 250.00, 250.01)., Disp: 1 each, Rfl: 0 .  Calcium Carb-Cholecalciferol (CALTRATE 600+D) 600-800 MG-UNIT TABS, Take 1 tablet by mouth daily., Disp: , Rfl:  .  glucose blood test strip, Use as instructed, Disp: 100 each, Rfl: 1 .  LORazepam (ATIVAN) 1 MG tablet, TAKE 1/2 TO 1 TABLET BY MOUTH THREE TIMES DAILY AS NEEDED, Disp: 90 tablet, Rfl: 0 .  Magnesium Oxide (MAG-200 PO), Take 250 mg by mouth daily. , Disp: , Rfl:  .  Multiple Vitamins-Minerals (ICAPS AREDS 2 PO), Take 1 tablet by mouth daily., Disp: , Rfl:  .  Omega-3 Fatty Acids (FISH OIL) 1000 MG CAPS, Take by mouth., Disp: , Rfl:  .  omeprazole (PRILOSEC) 20 MG capsule, Take 1 capsule (20 mg total) by mouth daily., Disp: 30 capsule, Rfl: 3 .  SYNTHROID 75 MCG tablet, TAKE 1 TABLET BY MOUTH  DAILY BEFORE BREAKFAST, Disp: 90 tablet, Rfl: 1   Allergies  Allergen Reactions  . Alendronate Sodium     REACTION: pt states "blisters in my mouth"  . Penicillins Other (See Comments)    REACTION: fatigue/malaise, "feels extremely bad"  Did PCN reaction causing immediate rash, facial/tongue/throat swelling, SOB or lightheadedness with hypotension: no Has patient had a PCN reaction causing severe rash involving mucus membranes or skin necrosis: no Has patient had a PCN reaction that required hospitalization : no Has patient had a PCN reaction occurring within the last 10 years: yes If all of the above answers are "NO", then may proceed with Cephalosporin use. Extremely lethargic  . Raloxifene     REACTION: pt states "it dried me out"  . Risedronate Sodium     REACTION: pt states "teeth problems"  . Latex Rash   Review of Systems   Pertinent items are  noted in the HPI. Otherwise, ROS is negative.  Vitals:   Vitals:   08/15/17 1027  BP: 128/80  Pulse: 73  Temp: 98.1 F (36.7 C)  TempSrc: Oral  SpO2: 96%  Weight: 143 lb 9.6 oz (65.1 kg)  Height: _0  (1.702 m)     Body mass index is 22.49 kg/m.  Physical Exam:   Physical Exam  Constitutional: She appears well-nourished.  HENT:  Head: Normocephalic and atraumatic.  Eyes: Pupils are equal, round, and reactive to light. EOM are normal.  Neck: Normal range of motion. Neck supple.  Cardiovascular: Normal rate, regular rhythm, normal heart sounds and intact distal pulses.  Pulmonary/Chest: Effort normal.  Abdominal: Soft.  Skin: Skin is warm.  Psychiatric: She has a normal mood and affect. Her behavior is normal.  Nursing note and vitals reviewed.   Assessment and Plan:   [name] was seen today for follow-up.  Diagnoses and all orders for this visit:  Hematuria, unspecified type -     Urine Culture -     Urinalysis, Routine w reflex microscopic  Essential hypertension -     Lipid panel -     Magnesium -     Comprehensive metabolic panel    . Reviewed expectations re: course of current medical issues. . Discussed self-management of symptoms. . Outlined signs and symptoms indicating need for more acute intervention. . Patient verbalized understanding and all questions were answered. Marland Kitchen Health Maintenance issues including appropriate healthy diet, exercise, and smoking avoidance were discussed with patient. . See orders for this visit as documented in the electronic medical record. . Patient received an After Visit Summary.  Briscoe Deutscher, DO [place] 08/16/2017  No future appointments.

## 2017-08-15 NOTE — Patient Instructions (Signed)
Look into Propranolol

## 2017-08-16 ENCOUNTER — Encounter: Payer: Self-pay | Admitting: Family Medicine

## 2017-08-16 LAB — URINE CULTURE
MICRO NUMBER:: 90787420
SPECIMEN QUALITY:: ADEQUATE

## 2017-09-14 DIAGNOSIS — E063 Autoimmune thyroiditis: Secondary | ICD-10-CM | POA: Insufficient documentation

## 2017-09-18 ENCOUNTER — Ambulatory Visit: Payer: Medicare Other | Admitting: Family Medicine

## 2017-09-19 ENCOUNTER — Ambulatory Visit: Payer: Medicare Other | Admitting: Family Medicine

## 2017-09-19 ENCOUNTER — Encounter: Payer: Self-pay | Admitting: Family Medicine

## 2017-09-19 VITALS — BP 126/78 | HR 72 | Temp 97.9°F | Ht 67.0 in | Wt 144.0 lb

## 2017-09-19 DIAGNOSIS — M8589 Other specified disorders of bone density and structure, multiple sites: Secondary | ICD-10-CM

## 2017-09-19 DIAGNOSIS — E063 Autoimmune thyroiditis: Secondary | ICD-10-CM | POA: Diagnosis not present

## 2017-09-19 DIAGNOSIS — R29898 Other symptoms and signs involving the musculoskeletal system: Secondary | ICD-10-CM | POA: Diagnosis not present

## 2017-09-19 NOTE — Progress Notes (Signed)
Ashley [name] is a 77 y.o. female is here for follow up.  History of Present Illness:   Ashley [name], Ashley [name] acting as scribe for Dr. Briscoe Deutscher.   HPI: Patient in office for follow up. She has had some leg weakness for about two weeks. It has improved with swimming class she is taking. She can not tell a time of day that it is worse. It just comes and goes at random times. She does not have and numbness just feels fatigue in legs from knees down.   Health Maintenance Due  Topic Date Due  . INFLUENZA VACCINE  09/14/2017   Depression screen [facility] 2/9 08/15/2017 12/26/2016 06/23/2016  Decreased Interest 2 0 0  Down, Depressed, Hopeless 0 0 0  PHQ - 2 Score 2 0 0  Altered sleeping 1 3 -  Tired, decreased energy 1 0 -  Change in appetite 0 0 -  Feeling bad or failure about yourself  0 0 -  Trouble concentrating 0 0 -  Moving slowly or fidgety/restless 0 0 -  Suicidal thoughts 0 0 -  PHQ-9 Score 4 3 -  Difficult doing work/chores Not difficult at all Somewhat difficult -   PMHx, SurgHx, SocialHx, FamHx, Medications, and Allergies were reviewed in the Visit Navigator and updated as appropriate.   Patient Active Problem List   Diagnosis Date Noted  . Hashimoto's thyroiditis 09/14/2017  . Encounter for glucometer instruction 12/27/2016  . 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 IFG (impaired fasting glucose) 06/16/2016  . Essential hypertension 06/17/2015  . H/O mixed hyperlipidemia 06/17/2015  . Dysthymia 06/17/2015  . Vitamin D deficiency 06/17/2014  . Osteopenia 08/19/2013  . Diverticulosis of large intestine 05/04/2007  . Hypothyroidism 05/03/2007  . Anxiety state 05/03/2007  . Hemorrhoids 05/03/2007   Social History   Tobacco Use  . Smoking status: Never Smoker  . Smokeless tobacco: Never Used  Substance Use Topics  . Alcohol use: No  . Drug use: No   Current Medications and Allergies:   .  ALPRAZolam (XANAX) 0.25 MG tablet, Take 1 tablet (0.25 mg  total) by mouth 2 (two) times daily as needed for anxiety., Disp: 20 tablet, Rfl: 0 .  amLODipine (NORVASC) 5 MG tablet, TAKE 1 TABLET(5 MG) BY MOUTH DAILY, Disp: 90 tablet, Rfl: 2 .  aspirin 81 MG tablet, Take 81 mg by mouth daily., Disp: , Rfl:  .  blood glucose meter kit and supplies KIT, Dispense based on patient and insurance preference. Use up to four times daily as directed. (FOR ICD-9 250.00, 250.01)., Disp: 1 each, Rfl: 0 .  Calcium Carb-Cholecalciferol (CALTRATE 600+D) 600-800 MG-UNIT TABS, Take 1 tablet by mouth daily., Disp: , Rfl:  .  glucose blood test strip, Use as instructed, Disp: 100 each, Rfl: 1 .  LORazepam (ATIVAN) 1 MG tablet, TAKE 1/2 TO 1 TABLET BY MOUTH THREE TIMES DAILY AS NEEDED, Disp: 90 tablet, Rfl: 0 .  Magnesium Oxide (MAG-200 PO), Take 250 mg by mouth daily. , Disp: , Rfl:  .  Multiple Vitamins-Minerals (ICAPS AREDS 2 PO), Take 1 tablet by mouth daily., Disp: , Rfl:  .  Omega-3 Fatty Acids (FISH OIL) 1000 MG CAPS, Take by mouth., Disp: , Rfl:  .  omeprazole (PRILOSEC) 20 MG capsule, Take 1 capsule (20 mg total) by mouth daily., Disp: 30 capsule, Rfl: 3 .  SYNTHROID 75 MCG tablet, TAKE 1 TABLET BY MOUTH  DAILY BEFORE BREAKFAST, Disp: 90 tablet, Rfl: 1   Allergies  Allergen  Reactions  . Alendronate Sodium     REACTION: pt states "blisters in my mouth"  . Penicillins Other (See Comments)    REACTION: fatigue/malaise, "feels extremely bad" Did PCN reaction causing immediate rash, facial/tongue/throat swelling, SOB or lightheadedness with hypotension: no Has patient had a PCN reaction causing severe rash involving mucus membranes or skin necrosis: no Has patient had a PCN reaction that required hospitalization : no Has patient had a PCN reaction occurring within the last 10 years: yes If all of the above answers are "NO", then may proceed with Cephalosporin use. Extremely lethargic  . Raloxifene     REACTION: pt states "it dried me out"  . Risedronate Sodium      REACTION: pt states "teeth problems"  . Latex Rash   Review of Systems   Pertinent items are noted in the HPI. Otherwise, ROS is negative.  Vitals:   Vitals:   09/19/17 1122  BP: 126/78  Pulse: 72  Temp: 97.9 F (36.6 C)  TempSrc: Oral  SpO2: 93%  Weight: 144 lb (65.3 kg)  Height: 5' 7"  (1.702 m)     Body mass index is 22.55 kg/m.  Physical Exam:   Physical Exam  Constitutional: She appears well-nourished.  HENT:  Head: Normocephalic and atraumatic.  Eyes: Pupils are equal, round, and reactive to light. EOM are normal.  Neck: Normal range of motion. Neck supple.  Cardiovascular: Normal rate, regular rhythm, normal heart sounds and intact distal pulses.  Pulmonary/Chest: Effort normal.  Abdominal: Soft.  Skin: Skin is warm.  Psychiatric: She has a normal mood and affect. Her behavior is normal.  Nursing note and vitals reviewed.  Assessment and Plan:   Ashley [name] was seen today for follow-up and extremity weakness.  Diagnoses and all orders for this visit:  Muscular deconditioning Comments: See AVS.  Hashimoto's thyroiditis Comments: Followed by Dr. Chalmers [name]. Doing well.  Osteopenia of multiple sites    . Reviewed expectations re: course of current medical issues. . Discussed self-management of symptoms. . Outlined signs and symptoms indicating need for more acute intervention. . Patient verbalized understanding and all questions were answered. Marland Kitchen Health Maintenance issues including appropriate healthy diet, exercise, and smoking avoidance were discussed with patient. . See orders for this visit as documented in the electronic medical record. . Patient received an After Visit Summary.  Briscoe Deutscher, DO [place] 09/24/2017  Future Appointments  Date Time Provider [place]  12/28/2017  9:00 AM [facility] CO[facility]   Ashley [name] served as scribe during this visit. History, Physical, and Plan performed by medical provider. The  above documentation has been reviewed and is accurate and complete. Briscoe Deutscher, D.O.

## 2017-09-19 NOTE — Patient Instructions (Signed)
For exercise, consider the [employer], PT Pilates, and Cardiopulmonary rehab.

## 2017-09-22 ENCOUNTER — Encounter: Payer: Self-pay | Admitting: Family Medicine

## 2017-10-24 ENCOUNTER — Other Ambulatory Visit: Payer: Self-pay | Admitting: Family Medicine

## 2017-10-24 DIAGNOSIS — E039 Hypothyroidism, unspecified: Secondary | ICD-10-CM

## 2017-11-13 ENCOUNTER — Ambulatory Visit: Payer: Self-pay

## 2017-11-13 NOTE — Telephone Encounter (Signed)
Returned call to pt.  Reported she would like to know if Dr. Earlene Plater recommends that she receive the high dose or low dose flu vaccine with her Hashimoto's disease? Questioned pt. About how she is feeling with the Hashimoto Disease?  Reported she is learning to cope with different symptoms that make her feel bad; ie: doesn't tolerate the heat.  Admitted that she really doesn't understand the disease completely, and doesn't know what to expect.  Gave example of when her head feels foggy, she takes time to eat protein and rest awhile, until she feels better.  Also, reported she experienced indigestion about a week ago, after eating beans that were not completely cooked, and it got better on its own.  Wanted to make Dr. Earlene Plater aware of the swim aerobics being very helpful; stated the cool water makes her body feel better, and refreshes her. Advised will send note to Dr. Earlene Plater with update on symptoms, and her question about receiving the flu shot.  Agreed with plan.     Reason for Disposition . [1] Caller requesting NON-URGENT health information AND [2] PCP's office is the best resource  Answer Assessment - Initial Assessment Questions 1. REASON FOR CALL or QUESTION: "What is your reason for calling today?" or "How can I best help you?" or "What question do you have that I can help answer?"     Asking for recommendation in receiving high dose flu shot with Hashimotos disease  Protocols used: INFORMATION ONLY CALL-A-AH  Message from Maia Petties sent at 11/13/2017 9:46 AM EDT   Summary: flu shot   Pt states she was diagnosed with Hashimotos and is wanting to make sure it is safe to take the high dose flu shot. Please call before 11:15 or after 12:30. Ok to leave a msg if no answer.

## 2017-11-13 NOTE — Telephone Encounter (Signed)
Please advise 

## 2017-11-13 NOTE — Telephone Encounter (Signed)
Either are fine. Some evidence that high dose not really more helpful than regular. Okay regular dose if she wants.

## 2017-11-15 NOTE — Telephone Encounter (Signed)
Called patient she is going to get low dose from pharmacy

## 2017-11-23 LAB — HM DIABETES EYE EXAM

## 2017-11-24 ENCOUNTER — Other Ambulatory Visit: Payer: Self-pay | Admitting: Family Medicine

## 2017-11-24 DIAGNOSIS — F419 Anxiety disorder, unspecified: Secondary | ICD-10-CM

## 2017-12-19 ENCOUNTER — Other Ambulatory Visit: Payer: Self-pay | Admitting: Family Medicine

## 2017-12-19 ENCOUNTER — Telehealth: Payer: Self-pay | Admitting: Family Medicine

## 2017-12-19 DIAGNOSIS — F419 Anxiety disorder, unspecified: Secondary | ICD-10-CM

## 2017-12-19 NOTE — Telephone Encounter (Signed)
See note  Copied from CRM [id number]. Topic: General - Other >> Dec 19, 2017 10:14 AM Darletta Moll L wrote: Reason for CRM: Patient wants to know if she has a medical contraindication regarding the pneumonia vaccine and "hashimotos" before she comes in on 12/28/2017 for AWV? Patient also wants to know if she need her TSH done on the day of her AWV and whether there is a such thing as a blood alcohol test that can be done at the office? Please advise.

## 2017-12-19 NOTE — Telephone Encounter (Signed)
Last script 08/07/17 #90 no rf  Last app 09/19/17 No f/u  awv 12/28/17

## 2017-12-19 NOTE — Telephone Encounter (Signed)
Please advise 

## 2017-12-20 NOTE — Telephone Encounter (Signed)
Okay flu vax. Okay labs (TSH, free T4, TPO). I'm confused about the blood alcohol test question. We can check alcohol levels. Okay to ask health coach if she wants to wait.

## 2017-12-20 NOTE — Telephone Encounter (Signed)
Called patient she has decided to make app to come in has several questions to go over in office. Did not place labs but put note in app that she needs.

## 2017-12-24 NOTE — Progress Notes (Signed)
Ashley [name] is a 77 y.o. female is here for follow up.  History of Present Illness:   HPI: See Assessment and Plan section for Problem Based Charting of issues discussed today.   There are no preventive care reminders to display for this patient.   Depression screen [facility] 2/9 12/28/2017 12/25/2017 08/15/2017  Decreased Interest 0 0 2  Down, Depressed, Hopeless 0 2 0  PHQ - 2 Score 0 2 2  Altered sleeping - 1 1  Tired, decreased energy - 1 1  Change in appetite - 0 0  Feeling bad or failure about yourself  - 0 0  Trouble concentrating - 0 0  Moving slowly or fidgety/restless - 0 0  Suicidal thoughts - 0 0  PHQ-9 Score - 4 4  Difficult doing work/chores - Somewhat difficult Not difficult at all   PMHx, SurgHx, SocialHx, FamHx, Medications, and Allergies were reviewed in the Visit Navigator and updated as appropriate.   Patient Active Problem List   Diagnosis Date Noted  . Hashimoto's thyroiditis 09/14/2017  . Encounter for glucometer instruction 12/27/2016  . 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 IFG (impaired fasting glucose) 06/16/2016  . Essential hypertension 06/17/2015  . H/O mixed hyperlipidemia 06/17/2015  . Dysthymia 06/17/2015  . Vitamin D deficiency 06/17/2014  . Osteopenia 08/19/2013  . Diverticulosis of large intestine 05/04/2007  . Hypothyroidism 05/03/2007  . Anxiety state 05/03/2007  . Hemorrhoids 05/03/2007   Social History   Tobacco Use  . Smoking status: Never Smoker  . Smokeless tobacco: Never Used  Substance Use Topics  . Alcohol use: No  . Drug use: No   Current Medications and Allergies:   .  amLODipine (NORVASC) 5 MG tablet, TAKE 1 TABLET(5 MG) BY MOUTH DAILY, Disp: 90 tablet, Rfl: 2 .  aspirin 81 MG tablet, Take 81 mg by mouth daily., Disp: , Rfl:  .  Calcium Carb-Cholecalciferol (CALTRATE 600+D) 600-800 MG-UNIT TABS, Take 1 tablet by mouth daily., Disp: , Rfl:  .  LORazepam (ATIVAN) 1 MG tablet, TAKE 1/2 TO 1 TABLET BY  MOUTH THREE TIMES DAILY AS NEEDED,  .  Magnesium Oxide (MAG-200 PO), Take 250 mg by mouth daily. , Disp: , Rfl:  .  Omega-3 Fatty Acids (FISH OIL) 1000 MG CAPS, Take by mouth., Disp: , Rfl:  .  omeprazole (PRILOSEC) 20 MG capsule, Take 1 capsule (20 mg total) by mouth daily., Disp: 30 capsule, Rfl: 3 .  SYNTHROID 75 MCG tablet, TAKE 1 TABLET BY MOUTH  DAILY BEFORE BREAKFAST, Disp: 90 tablet, Rfl: 1   Allergies  Allergen Reactions  . Alendronate Sodium     REACTION: pt states "blisters in my mouth"  . Other     May be more allergic to milk  . Penicillins Other (See Comments)    REACTION: fatigue/malaise, "feels extremely bad" Did PCN reaction causing immediate rash, facial/tongue/throat swelling, SOB or lightheadedness with hypotension: no Has patient had a PCN reaction causing severe rash involving mucus membranes or skin necrosis: no Has patient had a PCN reaction that required hospitalization : no Has patient had a PCN reaction occurring within the last 10 years: yes If all of the above answers are "NO", then may proceed with Cephalosporin use. Extremely lethargic  . Raloxifene     REACTION: pt states "it dried me out"  . Risedronate Sodium     REACTION: pt states "teeth problems"  . Citalopram Anxiety  . Latex Rash   Review of Systems   Pertinent items  are noted in the HPI. Otherwise, ROS is negative.  Vitals:   Vitals:   12/25/17 1453  BP: 140/68  Pulse: 79  Temp: 97.9 F (36.6 C)  TempSrc: Oral  SpO2: 97%  Weight: 143 lb 3.2 oz (65 kg)  Height: 5' 7" (1.702 m)     Body mass index is 22.43 kg/m.  Physical Exam:   Physical Exam  Constitutional: She appears well-nourished.  HENT:  Head: Normocephalic and atraumatic.  Eyes: Pupils are equal, round, and reactive to light. EOM are normal.  Neck: Normal range of motion. Neck supple.  Cardiovascular: Normal rate, regular rhythm, normal heart sounds and intact distal pulses.  Pulmonary/Chest: Effort normal.    Abdominal: Soft.  Skin: Skin is warm.  Psychiatric: She has a normal mood and affect. Her behavior is normal.  Nursing note and vitals reviewed.  Assessment and Plan:   Anxiety state Assessment: Current symptoms: nervous. No current suicidal and homicidal ideation. Side effects from treatment: none.  Plan: 1. 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  2. Labs: see orders. 3. Counseling offered. 4. If she has any significant side effects to the medicine, she is to stop it and call for advice. Instructed patient to contact office or on-call physician promptly should condition worsen or any new symptoms appear.   Hashimoto's thyroiditis History: Current symptoms: none. Patient denies change in energy level, diarrhea, heat / cold intolerance, nervousness, palpitations and weight changes. Symptoms have been well-controlled.  Assessment/Plan:  Recheck thyroid labs today. Instructed not to take MVM or iron within 4 hours of taking thyroid medications.  Dysthymia History: Current symptoms include depressed mood. Symptoms have been unchanged since that time. Patient denies impaired memory, insomnia and recurrent thoughts of death. Previous treatment includes: medication. She complains of the following side effects from the treatment: felt terrible and hated the withdrawal. Assessment/Plan:      1. Medications: patient declines. 2. Labs: see orders. 3. List of counselors provided. 4. Instructed patient to contact office or on-call physician promptly should condition worsen or any new symptoms appear. IF THE PATIENT HAS ANY SUICIDAL OR HOMICIDAL IDEATIONS, CALL THE OFFICE, DISCUSS WITH A SUPPORT MEMBER, OR GO TO THE ER IMMEDIATELY. Patient was agreeable with this plan.  Essential hypertension History: Review: taking medications as instructed, no medication side effects noted, no TIAs, no chest pain on exertion, no dyspnea on exertion, no swelling of ankles.  Smoker: No.  BP Readings from Last 3 Encounters:  12/28/17 124/84  12/25/17 140/68  09/19/17 126/78   Lab Results  Component Value Date   CREATININE 0.74 12/25/2017     Assessment/Plan: 5. Medication: no change. 6. Dietary sodium restriction. 7. Regular aerobic exercise. 8. Check blood pressures weekly and record.  Orders Placed This Encounter  Procedures  . T4, free  . TSH  . Thyroid peroxidase antibody  . Comp Met (CMET)  . Magnesium   . Reviewed expectations re: course of current medical issues. . Discussed self-management of symptoms. . Outlined signs and symptoms indicating need for more acute intervention. . Patient verbalized understanding and all questions were answered. Marland Kitchen Health Maintenance issues including appropriate healthy diet, exercise, and smoking avoidance were discussed with patient. . See orders for this visit as documented in the electronic medical record. . Patient received an After Visit Summary.  Briscoe Deutscher, DO [place] 12/30/2017

## 2017-12-25 ENCOUNTER — Encounter: Payer: Self-pay | Admitting: Family Medicine

## 2017-12-25 ENCOUNTER — Ambulatory Visit: Payer: Medicare Other | Admitting: Family Medicine

## 2017-12-25 VITALS — BP 140/68 | HR 79 | Temp 97.9°F | Ht 67.0 in | Wt 143.2 lb

## 2017-12-25 DIAGNOSIS — F411 Generalized anxiety disorder: Secondary | ICD-10-CM | POA: Diagnosis not present

## 2017-12-25 DIAGNOSIS — E063 Autoimmune thyroiditis: Secondary | ICD-10-CM

## 2017-12-25 DIAGNOSIS — F341 Dysthymic disorder: Secondary | ICD-10-CM

## 2017-12-25 DIAGNOSIS — I10 Essential (primary) hypertension: Secondary | ICD-10-CM

## 2017-12-25 NOTE — Patient Instructions (Signed)
Try increasing Magnesium to once every night.

## 2017-12-26 LAB — COMPREHENSIVE METABOLIC PANEL
ALT: 10 U/L (ref 0–35)
AST: 12 U/L (ref 0–37)
Albumin: 4.2 g/dL (ref 3.5–5.2)
Alkaline Phosphatase: 44 U/L (ref 39–117)
BUN: 17 mg/dL (ref 6–23)
CO2: 29 mEq/L (ref 19–32)
Calcium: 9.5 mg/dL (ref 8.4–10.5)
Chloride: 102 mEq/L (ref 96–112)
Creatinine, Ser: 0.74 mg/dL (ref 0.40–1.20)
GFR: 80.54 mL/min (ref >60.00)
Glucose, Bld: 154 mg/dL — ABNORMAL HIGH (ref 70–99)
Potassium: 4 mEq/L (ref 3.5–5.1)
Sodium: 138 mEq/L (ref 135–145)
Total Bilirubin: 0.5 mg/dL (ref 0.2–1.2)
Total Protein: 6.5 g/dL (ref 6.0–8.3)

## 2017-12-26 LAB — MAGNESIUM: Magnesium: 2.2 mg/dL (ref 1.5–2.5)

## 2017-12-26 LAB — THYROID PEROXIDASE ANTIBODY: Thyroperoxidase Ab SerPl-aCnc: 173 IU/mL — ABNORMAL HIGH (ref <9)

## 2017-12-26 LAB — TSH: TSH: 5.82 u[IU]/mL — ABNORMAL HIGH (ref 0.35–4.50)

## 2017-12-26 LAB — T4, FREE: Free T4: 0.86 ng/dL (ref 0.60–1.60)

## 2017-12-27 NOTE — Progress Notes (Addendum)
Subjective:   Ashley [name] is a 77 y.o. female who presents for Medicare Annual/Subsequent preventive examination.  Reports health as fair Has just been dx with Hashimoto's  thyroiditis 12/25/2017 OV;    Diet Chol /hdl 2 Breakfast boiled eggs and do not eat the yelllow Bowl of apples salad (wordoff salad) pears Lunch goes out for lunch Protein, green  Supper light meal   Exercise 3 water aerobics classes a week   There are no preventive care reminders to display for this patient.  Colonoscopy 06/2011 -Dr. Earlene Plater discussed - May do cologuard if she needs another  Mammogram 05/2013 Bone density - could not take PO meds  -2.0 and Dr. Jennette Kettle at [facility]  Cardiac Risk Factors include: advanced age (>41men, >67 women);hypertension;family history of premature cardiovascular disease     Objective:    Vitals: BP 124/84   Pulse 71   Ht 5\' 7"  (1.702 m)   Wt 142 lb (64.4 kg)   SpO2 98%   BMI 22.24 kg/m   Body mass index is 22.24 kg/m.  Advanced Directives 12/28/2017 12/26/2016 12/26/2016 01/28/2016  Does Patient Have a Medical Advance Directive? Yes - Yes No  Does patient want to make changes to medical advance directive? - Yes (MAU/Ambulatory/Procedural Areas - Information given) No - Patient declined -    Tobacco Social History   Tobacco Use  Smoking Status Never Smoker  Smokeless Tobacco Never Used     Counseling given: Yes   Clinical Intake:       Past Medical History:  Diagnosis Date  . Anxiety state, unspecified   . Diverticulosis of colon (without mention of hemorrhage)   . Osteoporosis, unspecified   . Other chest pain   . Other malaise and fatigue   . Personal history of other malignant neoplasm of skin   . Unspecified hemorrhoids without mention of complication   . Unspecified hypothyroidism    Past Surgical History:  Procedure Laterality Date  . COLONOSCOPY     Family History  Problem Relation Age of Onset  . Ovarian cancer  Mother   . Pancreatic cancer Brother   . Diabetes Unknown   . Prostate cancer Brother    Social History   Socioeconomic History  . Marital status: Married    Spouse name: Molly Maduro  . Number of children: 2  . Years of education: Not on file  . Highest education level: Not on file  Occupational History  . Occupation: Retired  Engineer, production  . Financial resource strain: Not on file  . Food insecurity:    Worry: Not on file    Inability: Not on file  . Transportation needs:    Medical: Not on file    Non-medical: Not on file  Tobacco Use  . Smoking status: Never Smoker  . Smokeless tobacco: Never Used  Substance and Sexual Activity  . Alcohol use: No  . Drug use: No  . Sexual activity: Not on file  Lifestyle  . Physical activity:    Days per week: Not on file    Minutes per session: Not on file  . Stress: Not on file  Relationships  . Social connections:    Talks on phone: Not on file    Gets together: Not on file    Attends religious service: Not on file    Active member of club or organization: Not on file    Attends meetings of clubs or organizations: Not on file    Relationship status:  Not on file  Other Topics Concern  . Not on file  Social History Narrative  . Not on file    Outpatient Encounter Medications as of 12/28/2017  Medication Sig  . amLODipine (NORVASC) 5 MG tablet TAKE 1 TABLET(5 MG) BY MOUTH DAILY  . aspirin 81 MG tablet Take 81 mg by mouth daily.  . Calcium Carb-Cholecalciferol (CALTRATE 600+D) 600-800 MG-UNIT TABS Take 1 tablet by mouth daily.  Marland Kitchen. LORazepam (ATIVAN) 1 MG tablet TAKE 1/2 TO 1 TABLET BY MOUTH THREE TIMES DAILY AS NEEDED  . Magnesium Oxide (MAG-200 PO) Take 250 mg by mouth daily.   . Omega-3 Fatty Acids (FISH OIL) 1000 MG CAPS Take by mouth.  Marland Kitchen. omeprazole (PRILOSEC) 20 MG capsule Take 1 capsule (20 mg total) by mouth daily.  Marland Kitchen. SYNTHROID 75 MCG tablet TAKE 1 TABLET BY MOUTH  DAILY BEFORE BREAKFAST   No facility-administered  encounter medications on file as of 12/28/2017.     Activities of Daily Living In your present state of health, do you have any difficulty performing the following activities: 12/28/2017  Hearing? N  Vision? N  Difficulty concentrating or making decisions? N  Walking or climbing stairs? N  Dressing or bathing? N  Doing errands, shopping? N  Preparing Food and eating ? N  Using the Toilet? N  In the past six months, have you accidently leaked urine? N  Do you have problems with loss of bowel control? (No Data)  Comment takes stool softner as needed   Managing your Medications? N  Managing your Finances? N  Housekeeping or managing your Housekeeping? N  Some recent data might be hidden    Patient Care Team: Helane RimaWallace, Erica, DO as PCP - General (Family Medicine) Talmage CoinKerr, Jeffrey, MD as Consulting Physician (Endocrinology) [facility] as Consulting Physician (Dermatology) Ronney AstersJohn Scott as Consulting Physician (Ophthalmology) Drema HalonNewman, Christopher E, MD as Consulting Physician (Otolaryngology) Freddy FinnerNeal, W Ronald, MD as Consulting Physician (Obstetrics and Gynecology) Nicola Policeivils, John David, DMD as Consulting Physician (Dentistry)   Assessment:   This is a routine wellness examination for Ashley [name].  Exercise Activities and Dietary recommendations Current Exercise Habits: Structured exercise class, Type of exercise: strength training/weights;walking, Time (Minutes): 60, Frequency (Times/Week): 3, Weekly Exercise (Minutes/Week): 180, Intensity: Moderate  Goals    . Patient Stated     To address any thing in the future  Will get back to walking again. May add on slow        Fall Risk Fall Risk  12/28/2017 12/25/2017 12/26/2016 06/23/2016 08/19/2013  Falls in the past year? 0 0 No No No  Injury with Fall? - 0 - - -  Follow up - Falls evaluation completed - - -     Depression Screen PHQ 2/9 Scores 12/28/2017 12/25/2017 08/15/2017 12/26/2016  PHQ - 2 Score 0 2 2 0  PHQ- 9 Score - 4 4 3      Cognitive Function MMSE - Mini Mental State Exam 12/28/2017  Not completed: (No Data)     Ad8 score reviewed for issues:  Issues making decisions:  Less interest in hobbies / activities:  Repeats questions, stories (family complaining):  Trouble using ordinary gadgets (microwave, computer, phone):  Forgets the month or year:   Mismanaging finances:   Remembering appts:  Daily problems with thinking and/or memory: Ad8 score is=0        Immunization History  Administered Date(s) Administered  . H1N1 02/07/2008  . Influenza Inj Mdck Quad Pf 11/17/2017  . Influenza Split 12/04/2010, 11/16/2011  .  Influenza Whole 11/14/2008, 11/14/2009, 11/15/2017  . Influenza, High Dose Seasonal PF 11/15/2013, 11/20/2015, 11/25/2016  . Influenza,inj,Quad PF,6+ Mos 11/15/2014  . Influenza-Unspecified 10/15/2012, 11/16/2013  . Pneumococcal Conjugate-13 06/17/2014  . Pneumococcal Polysaccharide-23 05/16/2007  . Tdap 11/14/2004      Screening Tests Health Maintenance  Topic Date Due  . INFLUENZA VACCINE  Completed  . PNA vac Low Risk Adult  Completed         Plan:      PCP Notes   Health Maintenance IMM up to date; was asked not to take shingrix by MD for now due to thyroid d/o.  Colonoscopy 06/2011 -Dr. Earlene Plater discussed - May do cologuard if she needs another  Mammogram 05/2013- followed by Dr.Neal (GYN)  Bone density - could not take PO meds  -2.0 and Dr. Jennette Kettle at [facility] tolerate bisphosphanates and not sure she will have another Bone density; discussed pros and cons and referred to the osteoporosis foundation site   Abnormal Screens  none  Referrals  none  Patient concerns; none  Nurse Concerns; As noted  Next PCP apt 12/25/2017 was her last OV       I have personally reviewed and noted the following in the patient's chart:   . Medical and social history . Use of alcohol, tobacco or illicit drugs  . Current medications  and supplements . Functional ability and status . Nutritional status . Physical activity . Advanced directives . List of other physicians . Hospitalizations, surgeries, and ER visits in previous 12 months . Vitals . Screenings to include cognitive, depression, and falls . Referrals and appointments  In addition, I have reviewed and discussed with patient certain preventive protocols, quality metrics, and best practice recommendations. A written personalized care plan for preventive services as well as general preventive health recommendations were provided to patient.     Montine Circle, RN  12/28/2017

## 2017-12-28 ENCOUNTER — Ambulatory Visit (INDEPENDENT_AMBULATORY_CARE_PROVIDER_SITE_OTHER): Payer: Medicare Other

## 2017-12-28 VITALS — BP 124/84 | HR 71 | Ht 67.0 in | Wt 142.0 lb

## 2017-12-28 DIAGNOSIS — Z0000 Encounter for general adult medical examination without abnormal findings: Secondary | ICD-10-CM | POA: Diagnosis not present

## 2017-12-28 NOTE — Progress Notes (Signed)
I have reviewed and agree with note, evaluation, plan. Appreciate referring her to osteoporosis foundation for more information as she seems hesitant to use medications for bone density preservation.   Tana ConchStephen Kirke Breach, MD

## 2017-12-28 NOTE — Patient Instructions (Addendum)
Ashley [name] , Thank you for taking time to come for your Medicare Wellness Visit. I appreciate your ongoing commitment to your health goals. Please review the following plan we discussed and let me know if I can assist you in the future.    Recommendations for Dexa Scan Female over the age of 64 Man age 78 or older If you broke a bone past the age of 97 Women menopausal age with risk factors (thin frame; smoker; hx of fx ) Post menopausal women under the age of 54 with risk factors A man age 10 to 45 with risk factors Other: Spine xray that is showing break of bone loss Back pain with possible break Height loss of 1/2 inch or more within one year Total loss in height of 1.5 inches from your original height  Calcium '1200mg'$  with Vit D 800u per day; more as directed by physician Strength building exercises discussed; can include walking; housework; small weights or stretch bands; silver sneakers if access to the [facility]  Please visit the osteoporosis foundation.org for up to date recommendations   These are the goals we discussed: Goals    . Patient Stated     To address any thing in the future  Will get back to walking again. May add on slow        This is a list of the screening recommended for you and due dates:  Health Maintenance  Topic Date Due  . Flu Shot  Completed  . Pneumonia vaccines  Completed      Fall Prevention in the Home Falls can cause injuries. They can happen to people of all ages. There are many things you can do to make your home safe and to help prevent falls. What can I do on the outside of my home?  Regularly fix the edges of walkways and driveways and fix any cracks.  Remove anything that might make you trip as you walk through a door, such as a raised step or threshold.  Trim any bushes or trees on the path to your home.  Use bright outdoor lighting.  Clear any walking paths of anything that might make someone trip, such as rocks or  tools.  Regularly check to see if handrails are loose or broken. Make sure that both sides of any steps have handrails.  Any raised decks and porches should have guardrails on the edges.  Have any leaves, snow, or ice cleared regularly.  Use sand or salt on walking paths during winter.  Clean up any spills in your garage right away. This includes oil or grease spills. What can I do in the bathroom?  Use night lights.  Install grab bars by the toilet and in the tub and shower. Do not use towel bars as grab bars.  Use non-skid mats or decals in the tub or shower.  If you need to sit down in the shower, use a plastic, non-slip stool.  Keep the floor dry. Clean up any water that spills on the floor as soon as it happens.  Remove soap buildup in the tub or shower regularly.  Attach bath mats securely with double-sided non-slip rug tape.  Do not have throw rugs and other things on the floor that can make you trip. What can I do in the bedroom?  Use night lights.  Make sure that you have a light by your bed that is easy to reach.  Do not use any sheets or blankets that are too big  for your bed. They should not hang down onto the floor.  Have a firm chair that has side arms. You can use this for support while you get dressed.  Do not have throw rugs and other things on the floor that can make you trip. What can I do in the kitchen?  Clean up any spills right away.  Avoid walking on wet floors.  Keep items that you use a lot in easy-to-reach places.  If you need to reach something above you, use a strong step stool that has a grab bar.  Keep electrical cords out of the way.  Do not use floor polish or wax that makes floors slippery. If you must use wax, use non-skid floor wax.  Do not have throw rugs and other things on the floor that can make you trip. What can I do with my stairs?  Do not leave any items on the stairs.  Make sure that there are handrails on both  sides of the stairs and use them. Fix handrails that are broken or loose. Make sure that handrails are as long as the stairways.  Check any carpeting to make sure that it is firmly attached to the stairs. Fix any carpet that is loose or worn.  Avoid having throw rugs at the top or bottom of the stairs. If you do have throw rugs, attach them to the floor with carpet tape.  Make sure that you have a light switch at the top of the stairs and the bottom of the stairs. If you do not have them, ask someone to add them for you. What else can I do to help prevent falls?  Wear shoes that: ? Do not have high heels. ? Have rubber bottoms. ? Are comfortable and fit you well. ? Are closed at the toe. Do not wear sandals.  If you use a stepladder: ? Make sure that it is fully opened. Do not climb a closed stepladder. ? Make sure that both sides of the stepladder are locked into place. ? Ask someone to hold it for you, if possible.  Clearly mark and make sure that you can see: ? Any grab bars or handrails. ? First and last steps. ? Where the edge of each step is.  Use tools that help you move around (mobility aids) if they are needed. These include: ? Canes. ? Walkers. ? Scooters. ? Crutches.  Turn on the lights when you go into a dark area. Replace any light bulbs as soon as they burn out.  Set up your furniture so you have a clear path. Avoid moving your furniture around.  If any of your floors are uneven, fix them.  If there are any pets around you, be aware of where they are.  Review your medicines with your doctor. Some medicines can make you feel dizzy. This can increase your chance of falling. Ask your doctor what other things that you can do to help prevent falls. This information is not intended to replace advice given to you by your health care provider. Make sure you discuss any questions you have with your health care provider. Document Released: 11/27/2008 Document Revised:  07/09/2015 Document Reviewed: 03/07/2014 Elsevier Interactive Patient Education  2018 [name] Maintenance, Female Adopting a healthy lifestyle and getting preventive care can go a long way to promote health and wellness. Talk with your health care provider about what schedule of regular examinations is right for you. This is a good chance  for you to check in with your provider about disease prevention and staying healthy. In between checkups, there are plenty of things you can do on your own. Experts have done a lot of research about which lifestyle changes and preventive measures are most likely to keep you healthy. Ask your health care provider for more information. Weight and diet Eat a healthy diet  Be sure to include plenty of vegetables, fruits, low-fat dairy products, and lean protein.  Do not eat a lot of foods high in solid fats, added sugars, or salt.  Get regular exercise. This is one of the most important things you can do for your health. ? Most adults should exercise for at least 150 minutes each week. The exercise should increase your heart rate and make you sweat (moderate-intensity exercise). ? Most adults should also do strengthening exercises at least twice a week. This is in addition to the moderate-intensity exercise.  Maintain a healthy weight  Body mass index (BMI) is a measurement that can be used to identify possible weight problems. It estimates body fat based on height and weight. Your health care provider can help determine your BMI and help you achieve or maintain a healthy weight.  For females 38 years of age and older: ? A BMI below 18.5 is considered underweight. ? A BMI of 18.5 to 24.9 is normal. ? A BMI of 25 to 29.9 is considered overweight. ? A BMI of 30 and above is considered obese.  Watch levels of cholesterol and blood lipids  You should start having your blood tested for lipids and cholesterol at 78 years of age, then have this test  every 5 years.  You may need to have your cholesterol levels checked more often if: ? Your lipid or cholesterol levels are high. ? You are older than 78 years of age. ? You are at high risk for heart disease.  Cancer screening Lung Cancer  Lung cancer screening is recommended for adults 92-24 years old who are at high risk for lung cancer because of a history of smoking.  A yearly low-dose CT scan of the lungs is recommended for people who: ? Currently smoke. ? Have quit within the past 15 years. ? Have at least a 30-pack-year history of smoking. A pack year is smoking an average of one pack of cigarettes a day for 1 year.  Yearly screening should continue until it has been 15 years since you quit.  Yearly screening should stop if you develop a health problem that would prevent you from having lung cancer treatment.  Breast Cancer  Practice breast self-awareness. This means understanding how your breasts normally appear and feel.  It also means doing regular breast self-exams. Let your health care provider know about any changes, no matter how small.  If you are in your 20s or 30s, you should have a clinical breast exam (CBE) by a health care provider every 1-3 years as part of a regular health exam.  If you are 65 or older, have a CBE every year. Also consider having a breast X-ray (mammogram) every year.  If you have a family history of breast cancer, talk to your health care provider about genetic screening.  If you are at high risk for breast cancer, talk to your health care provider about having an MRI and a mammogram every year.  Breast cancer gene (BRCA) assessment is recommended for women who have family members with BRCA-related cancers. BRCA-related cancers include: ? Breast. ? Ovarian. ?  Tubal. ? Peritoneal cancers.  Results of the assessment will determine the need for genetic counseling and BRCA1 and BRCA2 testing.  Cervical Cancer Your health care provider  may recommend that you be screened regularly for cancer of the pelvic organs (ovaries, uterus, and vagina). This screening involves a pelvic examination, including checking for microscopic changes to the surface of your cervix (Pap test). You may be encouraged to have this screening done every 3 years, beginning at age 67.  For women ages 80-65, health care providers may recommend pelvic exams and Pap testing every 3 years, or they may recommend the Pap and pelvic exam, combined with testing for human papilloma virus (HPV), every 5 years. Some types of HPV increase your risk of cervical cancer. Testing for HPV may also be done on women of any age with unclear Pap test results.  Other health care providers may not recommend any screening for nonpregnant women who are considered low risk for pelvic cancer and who do not have symptoms. Ask your health care provider if a screening pelvic exam is right for you.  If you have had past treatment for cervical cancer or a condition that could lead to cancer, you need Pap tests and screening for cancer for at least 20 years after your treatment. If Pap tests have been discontinued, your risk factors (such as having a new sexual partner) need to be reassessed to determine if screening should resume. Some women have medical problems that increase the chance of getting cervical cancer. In these cases, your health care provider may recommend more frequent screening and Pap tests.  Colorectal Cancer  This type of cancer can be detected and often prevented.  Routine colorectal cancer screening usually begins at 78 years of age and continues through 78 years of age.  Your health care provider may recommend screening at an earlier age if you have risk factors for colon cancer.  Your health care provider may also recommend using home test kits to check for hidden blood in the stool.  A small camera at the end of a tube can be used to examine your colon directly  (sigmoidoscopy or colonoscopy). This is done to check for the earliest forms of colorectal cancer.  Routine screening usually begins at age 46.  Direct examination of the colon should be repeated every 5-10 years through 78 years of age. However, you may need to be screened more often if early forms of precancerous polyps or small growths are found.  Skin Cancer  Check your skin from head to toe regularly.  Tell your health care provider about any new moles or changes in moles, especially if there is a change in a mole's shape or color.  Also tell your health care provider if you have a mole that is larger than the size of a pencil eraser.  Always use sunscreen. Apply sunscreen liberally and repeatedly throughout the day.  Protect yourself by wearing long sleeves, pants, a wide-brimmed hat, and sunglasses whenever you are outside.  Heart disease, diabetes, and high blood pressure  High blood pressure causes heart disease and increases the risk of stroke. High blood pressure is more likely to develop in: ? People who have blood pressure in the high end of the normal range (130-139/85-89 mm Hg). ? People who are overweight or obese. ? People who are African American.  If you are 41-61 years of age, have your blood pressure checked every 3-5 years. If you are 56 years of age or  older, have your blood pressure checked every year. You should have your blood pressure measured twice-once when you are at a hospital or clinic, and once when you are not at a hospital or clinic. Record the average of the two measurements. To check your blood pressure when you are not at a hospital or clinic, you can use: ? An automated blood pressure machine at a pharmacy. ? A home blood pressure monitor.  If you are between 62 years and 35 years old, ask your health care provider if you should take aspirin to prevent strokes.  Have regular diabetes screenings. This involves taking a blood sample to check your  fasting blood sugar level. ? If you are at a normal weight and have a low risk for diabetes, have this test once every three years after 78 years of age. ? If you are overweight and have a high risk for diabetes, consider being tested at a younger age or more often. Preventing infection Hepatitis B  If you have a higher risk for hepatitis B, you should be screened for this virus. You are considered at high risk for hepatitis B if: ? You were born in a country where hepatitis B is common. Ask your health care provider which countries are considered high risk. ? Your parents were born in a high-risk country, and you have not been immunized against hepatitis B (hepatitis B vaccine). ? You have HIV or AIDS. ? You use needles to inject street drugs. ? You live with someone who has hepatitis B. ? You have had sex with someone who has hepatitis B. ? You get hemodialysis treatment. ? You take certain medicines for conditions, including cancer, organ transplantation, and autoimmune conditions.  Hepatitis C  Blood testing is recommended for: ? Everyone born from [birth year] through 1966. ? Anyone with known risk factors for hepatitis C.  Sexually transmitted infections (STIs)  You should be screened for sexually transmitted infections (STIs) including gonorrhea and chlamydia if: ? You are sexually active and are younger than 78 years of age. ? You are older than 78 years of age and your health care provider tells you that you are at risk for this type of infection. ? Your sexual activity has changed since you were last screened and you are at an increased risk for chlamydia or gonorrhea. Ask your health care provider if you are at risk.  If you do not have HIV, but are at risk, it may be recommended that you take a prescription medicine daily to prevent HIV infection. This is called pre-exposure prophylaxis (PrEP). You are considered at risk if: ? You are sexually active and do not regularly use condoms  or know the HIV status of your partner(s). ? You take drugs by injection. ? You are sexually active with a partner who has HIV.  Talk with your health care provider about whether you are at high risk of being infected with HIV. If you choose to begin PrEP, you should first be tested for HIV. You should then be tested every 3 months for as long as you are taking PrEP. Pregnancy  If you are premenopausal and you may become pregnant, ask your health care provider about preconception counseling.  If you may become pregnant, take 400 to 800 micrograms (mcg) of folic acid every day.  If you want to prevent pregnancy, talk to your health care provider about birth control (contraception). Osteoporosis and menopause  Osteoporosis is a disease in which the bones lose minerals  and strength with aging. This can result in serious bone fractures. Your risk for osteoporosis can be identified using a bone density scan.  If you are 62 years of age or older, or if you are at risk for osteoporosis and fractures, ask your health care provider if you should be screened.  Ask your health care provider whether you should take a calcium or vitamin D supplement to lower your risk for osteoporosis.  Menopause may have certain physical symptoms and risks.  Hormone replacement therapy may reduce some of these symptoms and risks. Talk to your health care provider about whether hormone replacement therapy is right for you. Follow these instructions at home:  Schedule regular health, dental, and eye exams.  Stay current with your immunizations.  Do not use any tobacco products including cigarettes, chewing tobacco, or electronic cigarettes.  If you are pregnant, do not drink alcohol.  If you are breastfeeding, limit how much and how often you drink alcohol.  Limit alcohol intake to no more than 1 drink per day for nonpregnant women. One drink equals 12 ounces of beer, 5 ounces of wine, or 1 ounces of hard  liquor.  Do not use street drugs.  Do not share needles.  Ask your health care provider for help if you need support or information about quitting drugs.  Tell your health care provider if you often feel depressed.  Tell your health care provider if you have ever been abused or do not feel safe at home. This information is not intended to replace advice given to you by your health care provider. Make sure you discuss any questions you have with your health care provider. Document Released: 08/16/2010 Document Revised: 07/09/2015 Document Reviewed: 11/04/2014 Elsevier Interactive Patient Education  [employer].

## 2017-12-30 ENCOUNTER — Encounter: Payer: Self-pay | Admitting: Family Medicine

## 2017-12-30 NOTE — Assessment & Plan Note (Signed)
History: Review: taking medications as instructed, no medication side effects noted, no TIAs, no chest pain on exertion, no dyspnea on exertion, no swelling of ankles. Smoker: No.  BP Readings from Last 3 Encounters:  12/28/17 124/84  12/25/17 140/68  09/19/17 126/78   Lab Results  Component Value Date   CREATININE 0.74 12/25/2017     Assessment/Plan: 1. Medication: no change. 2. Dietary sodium restriction. 3. Regular aerobic exercise. 4. Check blood pressures weekly and record.

## 2017-12-30 NOTE — Assessment & Plan Note (Signed)
Assessment: Current symptoms: nervous. No current suicidal and homicidal ideation. Side effects from treatment: none.  Plan: 1. 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  2. Labs: see orders. 3. Counseling offered. 4. If she has any significant side effects to the medicine, she is to stop it and call for advice. Instructed patient to contact office or on-call physician promptly should condition worsen or any new symptoms appear.

## 2017-12-30 NOTE — Assessment & Plan Note (Signed)
History: Current symptoms include depressed mood. Symptoms have been unchanged since that time. Patient denies impaired memory, insomnia and recurrent thoughts of death. Previous treatment includes: medication. She complains of the following side effects from the treatment: felt terrible and hated the withdrawal. Assessment/Plan:      1. Medications: patient declines. 2. Labs: see orders. 3. List of counselors provided. 4. Instructed patient to contact office or on-call physician promptly should condition worsen or any new symptoms appear. IF THE PATIENT HAS ANY SUICIDAL OR HOMICIDAL IDEATIONS, CALL THE OFFICE, DISCUSS WITH A SUPPORT MEMBER, OR GO TO THE ER IMMEDIATELY. Patient was agreeable with this plan.

## 2017-12-30 NOTE — Assessment & Plan Note (Signed)
History: Current symptoms: none. Patient denies change in energy level, diarrhea, heat / cold intolerance, nervousness, palpitations and weight changes. Symptoms have been well-controlled.  Assessment/Plan:  Recheck thyroid labs today. Instructed not to take MVM or iron within 4 hours of taking thyroid medications.

## 2018-01-09 ENCOUNTER — Encounter: Payer: Self-pay | Admitting: Family Medicine

## 2018-01-10 ENCOUNTER — Ambulatory Visit: Payer: Self-pay | Admitting: *Deleted

## 2018-01-10 ENCOUNTER — Encounter: Payer: Self-pay | Admitting: Physician Assistant

## 2018-01-10 ENCOUNTER — Ambulatory Visit: Payer: Medicare Other | Admitting: Physician Assistant

## 2018-01-10 VITALS — BP 140/86 | HR 71 | Temp 98.4°F | Ht 67.0 in | Wt 142.0 lb

## 2018-01-10 DIAGNOSIS — J0100 Acute maxillary sinusitis, unspecified: Secondary | ICD-10-CM | POA: Diagnosis not present

## 2018-01-10 DIAGNOSIS — G4700 Insomnia, unspecified: Secondary | ICD-10-CM | POA: Diagnosis not present

## 2018-01-10 MED ORDER — DOXYCYCLINE HYCLATE 100 MG PO TABS: 100.0000 mg | ORAL_TABLET | Freq: Two times a day (BID) | ORAL | 0 refills | Status: DC

## 2018-01-10 MED ORDER — FLUTICASONE PROPIONATE 50 MCG/ACT NA SUSP: 2.0000 | Freq: Every day | NASAL | 2 refills | Status: DC

## 2018-01-10 NOTE — Telephone Encounter (Signed)
See note

## 2018-01-10 NOTE — Patient Instructions (Signed)
It was great to see you!  1. Start doxycycline antibiotic 2. Start flonase nasal spray, use once in AM and once in PM  Push fluids and get plenty of rest. Please return if you are not improving as expected, or if you have high fevers (>101.5) or difficulty swallowing or worsening productive cough.  Call clinic with questions.  I hope you start feeling better soon!  In regards to your Ativan, I am not comfortable recommending any changes to your current regimen. I think it would be best if you made an appointment with Dr. Earlene PlaterWallace to discuss further.

## 2018-01-10 NOTE — Telephone Encounter (Signed)
Contacted pt regarding symptoms; she has head congestion and drainage in her throat for 2 weeks; she says her daughter was given mucinex and she also has cough and nausea; she also has heaviness in her head; she reports having increased nasal secretions that have been clear to yellow; recommendations made per nurse triage protocol; she normally sees Dr Helane RimaErica Wallace but she has no availability; pt offered and accepted appointment with Ashley [name], [facility], 01/10/18 at 1300; she verbalized understanding; will route to office for notification of this upcoming appointment.    Reason for Disposition . [1] Sinus congestion (pressure, fullness) AND [2] present > 10 days  Answer Assessment - Initial Assessment Questions 1. LOCATION: "Where does it hurt?"      Pressure/heaviness, dull in head 2. ONSET: "When did the sinus pain start?"  (e.g., hours, days)      11/13/19s 3. SEVERITY: "How bad is the pain?"   (Scale 1-10; mild, moderate or severe)   - MILD (1-3): doesn't interfere with normal activities    - MODERATE (4-7): interferes with normal activities (e.g., work or school) or awakens from sleep   - SEVERE (8-10): excruciating pain and patient unable to do any normal activities        Mild to moderate 4. RECURRENT SYMPTOM: "Have you ever had sinus problems before?" If so, ask: "When was the last time?" and "What happened that time?"      no 5. NASAL CONGESTION: "Is the nose blocked?" If so, ask, "Can you open it or must you breathe through the mouth?"     yes 6. NASAL DISCHARGE: "Do you have discharge from your nose?" If so ask, "What color?"     Yes increased mucus; clear to yellow secretions 7. FEVER: "Do you have a fever?" If so, ask: "What is it, how was it measured, and when did it start?"      no 8. OTHER SYMPTOMS: "Do you have any other symptoms?" (e.g., sore throat, cough, earache, difficulty breathing)     Head congestion, cough, drainage in throat, nausea 9. PREGNANCY:  "Is there any chance you are pregnant?" "When was your last menstrual period?"     no  Protocols used: SINUS PAIN OR CONGESTION-A-AH

## 2018-01-10 NOTE — Progress Notes (Signed)
Ashley JarvisLinda S [name] is a 77 y.o. female here for a new problem.  I acted as a Neurosurgeonscribe for Energy East CorporationSamantha Grantley Savage, PA-C Corky Mullonna Orphanos, LPN  History of Present Illness:   Chief Complaint  Patient presents with  . Sinus Problem    Sinus Problem  This is a new problem. Episode onset: Started 2 weeks ago. The problem has been waxing and waning since onset. There has been no fever. Her pain is at a severity of 3/10. The pain is mild. Associated symptoms include congestion (Head and throat), coughing (expectorating clear sputum), a hoarse voice, sinus pressure and sneezing. Pertinent negatives include no chills, ear pain, headaches, neck pain, shortness of breath or sore throat. Treatments tried: Claritin occassionally. The treatment provided mild relief.   She states that the claritin is helping her symptoms.   Denies: SOB, CP  Insomnia Patient reports ongoing insomnia despite daily 1 mg Ativan nightly. Has been taking this for 20 years. She states that she is waking up at 4am every morning and is wondering if she can increase her Ativan to help with this. Has only tried melatonin in the past for her insomnia.   Past Medical History:  Diagnosis Date  . Anxiety state, unspecified   . Diverticulosis of colon (without mention of hemorrhage)   . Osteoporosis, unspecified   . Other chest pain   . Other malaise and fatigue   . Personal history of other malignant neoplasm of skin   . Unspecified hemorrhoids without mention of complication   . Unspecified hypothyroidism      Social History   Socioeconomic History  . Marital status: Married    Spouse name: Ashley MaduroRobert  . Number of children: 2  . Years of education: Not on file  . Highest education level: Not on file  Occupational History  . Occupation: Retired  Engineer, productionocial Needs  . Financial resource strain: Not on file  . Food insecurity:    Worry: Not on file    Inability: Not on file  . Transportation needs:    Medical: Not on file    Non-medical:  Not on file  Tobacco Use  . Smoking status: Never Smoker  . Smokeless tobacco: Never Used  Substance and Sexual Activity  . Alcohol use: No  . Drug use: No  . Sexual activity: Not on file  Lifestyle  . Physical activity:    Days per week: Not on file    Minutes per session: Not on file  . Stress: Not on file  Relationships  . Social connections:    Talks on phone: Not on file    Gets together: Not on file    Attends religious service: Not on file    Active member of club or organization: Not on file    Attends meetings of clubs or organizations: Not on file    Relationship status: Not on file  . Intimate partner violence:    Fear of current or ex partner: Not on file    Emotionally abused: Not on file    Physically abused: Not on file    Forced sexual activity: Not on file  Other Topics Concern  . Not on file  Social History Narrative  . Not on file    Past Surgical History:  Procedure Laterality Date  . COLONOSCOPY      Family History  Problem Relation Age of Onset  . Ovarian cancer Mother   . Pancreatic cancer Brother   . Diabetes Unknown   . Prostate  cancer Brother     Allergies  Allergen Reactions  . Alendronate Sodium     REACTION: pt states "blisters in my mouth"  . Other     May be more allergic to milk  . Penicillins Other (See Comments)    REACTION: fatigue/malaise, "feels extremely bad" Did PCN reaction causing immediate rash, facial/tongue/throat swelling, SOB or lightheadedness with hypotension: no Has patient had a PCN reaction causing severe rash involving mucus membranes or skin necrosis: no Has patient had a PCN reaction that required hospitalization : no Has patient had a PCN reaction occurring within the last 10 years: yes If all of the above answers are "NO", then may proceed with Cephalosporin use. Extremely lethargic  . Raloxifene     REACTION: pt states "it dried me out"  . Risedronate Sodium     REACTION: pt states "teeth problems"   . Citalopram Anxiety  . Latex Rash    Current Medications:   Current Outpatient Medications:  .  amLODipine (NORVASC) 5 MG tablet, TAKE 1 TABLET(5 MG) BY MOUTH DAILY, Disp: 90 tablet, Rfl: 2 .  aspirin 81 MG tablet, Take 81 mg by mouth daily., Disp: , Rfl:  .  Calcium Carb-Cholecalciferol (CALTRATE 600+D) 600-800 MG-UNIT TABS, Take 1 tablet by mouth daily., Disp: , Rfl:  .  LORazepam (ATIVAN) 1 MG tablet, TAKE 1/2 TO 1 TABLET BY MOUTH THREE TIMES DAILY AS NEEDED, Disp: 90 tablet, Rfl: 0 .  Magnesium Oxide (MAG-200 PO), Take 250 mg by mouth daily. , Disp: , Rfl:  .  Omega-3 Fatty Acids (FISH OIL) 1000 MG CAPS, Take by mouth., Disp: , Rfl:  .  omeprazole (PRILOSEC) 20 MG capsule, Take 1 capsule (20 mg total) by mouth daily., Disp: 30 capsule, Rfl: 3 .  OVER THE COUNTER MEDICATION, Take 1 capsule by mouth daily. Methyl Folate + with L-5-MTHF+Methyl B12, Disp: , Rfl:  .  SYNTHROID 75 MCG tablet, TAKE 1 TABLET BY MOUTH  DAILY BEFORE BREAKFAST, Disp: 90 tablet, Rfl: 1 .  doxycycline (VIBRA-TABS) 100 MG tablet, Take 1 tablet (100 mg total) by mouth 2 (two) times daily., Disp: 20 tablet, Rfl: 0 .  fluticasone (FLONASE) 50 MCG/ACT nasal spray, Place 2 sprays into both nostrils daily., Disp: 16 g, Rfl: 2   Review of Systems:   Review of Systems  Constitutional: Negative for chills.  HENT: Positive for congestion (Head and throat), hoarse voice, sinus pressure and sneezing. Negative for ear pain and sore throat.   Respiratory: Positive for cough (expectorating clear sputum). Negative for shortness of breath.   Musculoskeletal: Negative for neck pain.  Neurological: Negative for headaches.    Vitals:   Vitals:   01/10/18 1306  BP: 140/86  Pulse: 71  Temp: 98.4 F (36.9 C)  TempSrc: Oral  SpO2: 95%  Weight: 142 lb (64.4 kg)  Height: 5\' 7"  (1.702 m)     Body mass index is 22.24 kg/m.  Physical Exam:   Physical Exam  Constitutional: She appears well-developed. She is cooperative.   Non-toxic appearance. She does not have a sickly appearance. She does not appear ill. No distress.  HENT:  Head: Normocephalic and atraumatic.  Right Ear: Tympanic membrane, external ear and ear canal normal. Tympanic membrane is not erythematous, not retracted and not bulging.  Left Ear: Tympanic membrane, external ear and ear canal normal. Tympanic membrane is not erythematous, not retracted and not bulging.  Nose: Mucosal edema and rhinorrhea present. Right sinus exhibits maxillary sinus tenderness. Right sinus exhibits no  frontal sinus tenderness. Left sinus exhibits maxillary sinus tenderness. Left sinus exhibits no frontal sinus tenderness.  Mouth/Throat: Uvula is midline and mucous membranes are normal. Posterior oropharyngeal erythema present. No posterior oropharyngeal edema. Tonsils are 1+ on the right. Tonsils are 1+ on the left. No tonsillar exudate.  Eyes: Conjunctivae and lids are normal.  Neck: Trachea normal.  Cardiovascular: Normal rate, regular rhythm, S1 normal, S2 normal and normal heart sounds.  Pulmonary/Chest: Effort normal and breath sounds normal. She has no decreased breath sounds. She has no wheezes. She has no rhonchi. She has no rales.  Lymphadenopathy:    She has no cervical adenopathy.  Neurological: She is alert.  Skin: Skin is warm, dry and intact.  Psychiatric: She has a normal mood and affect. Her speech is normal and behavior is normal.  Nursing note and vitals reviewed.   Assessment and Plan:   Jhoselyn was seen today for sinus problem.  Diagnoses and all orders for this visit:  Acute non-recurrent maxillary sinusitis No red flags on exam.  Will initiate Doxycycline and Flonase per orders. Discussed taking medications as prescribed. Reviewed return precautions including worsening fever, SOB, worsening cough or other concerns. Push fluids and rest. I recommend that patient follow-up if symptoms worsen or persist despite treatment x 7-10 days, sooner if  needed.  Insomnia, unspecified type No changes in regimen at this time. Defer to PCP.   Other orders -     doxycycline (VIBRA-TABS) 100 MG tablet; Take 1 tablet (100 mg total) by mouth 2 (two) times daily. -     fluticasone (FLONASE) 50 MCG/ACT nasal spray; Place 2 sprays into both nostrils daily.   . Reviewed expectations re: course of current medical issues. . Discussed self-management of symptoms. . Outlined signs and symptoms indicating need for more acute intervention. . Patient verbalized understanding and all questions were answered. . See orders for this visit as documented in the electronic medical record. . Patient received an After-Visit Summary.  CMA or LPN served as scribe during this visit. History, Physical, and Plan performed by medical provider. The above documentation has been reviewed and is accurate and complete.  Jarold Motto, PA-C

## 2018-01-23 ENCOUNTER — Telehealth: Payer: Self-pay | Admitting: Family Medicine

## 2018-01-23 NOTE — Telephone Encounter (Signed)
Copied from CRM [id number]. Topic: Quick Communication - See Telephone Encounter >> Jan 23, 2018 11:15 AM Maia Pettiesrtiz, Kristie S wrote: CRM for notification. See Telephone encounter for: 01/23/18. Pt calling to see if she can talk with Dr. Earlene PlaterWallace about recent DX of Hashimoto's. She states she has trouble sleeping and thinks it's partial to this experience. She said that Dr. Earlene PlaterWallace mentioned a different medication for her for sleep but she hasn't started anything new. She has been taking LORazepam (ATIVAN) 1 MG tablet for years and it is not helping. She states she is not getting any sleep. She wants to make sure she does not go back on citalopram. She was on that when she was having thyroid problems. Pt requesting call from Dr. Earlene PlaterWallace if possible.  Preferred Pharmacy [id number] [place] [address] AT [facility] [address] [facility] [address] [facility] [phone] (Phone) [phone] (Fax)

## 2018-01-24 NOTE — Telephone Encounter (Signed)
Pt called in this morning stating that she took an extra half tablet of the LORazepam and she was able to actually sleep.  Pt says she usually wakes up in the mornings with a heart racing feeling but this morning she woke up with the heart racing feeling and took a atenolol which immediately stopped the racing feeling.  Pt doesn't have a prescription for the atenolol but is interested in getting one.

## 2018-01-24 NOTE — Telephone Encounter (Signed)
See note

## 2018-01-24 NOTE — Telephone Encounter (Signed)
Called patient informed that she should not take medications that she has not been given by doctor. I have made a 40 min app Friday for her her to come in for evaluation to see if med is needed. Offered appointment today with Dr. Earlene PlaterWallace and tomorrow with Lelon MastSamantha but she did not want them.

## 2018-01-26 ENCOUNTER — Encounter: Payer: Self-pay | Admitting: Family Medicine

## 2018-01-26 ENCOUNTER — Ambulatory Visit: Payer: Medicare Other | Admitting: Family Medicine

## 2018-01-26 VITALS — BP 130/74 | HR 76 | Temp 98.8°F | Ht 67.0 in | Wt 142.0 lb

## 2018-01-26 DIAGNOSIS — F5104 Psychophysiologic insomnia: Secondary | ICD-10-CM | POA: Diagnosis not present

## 2018-01-26 DIAGNOSIS — F411 Generalized anxiety disorder: Secondary | ICD-10-CM

## 2018-01-26 MED ORDER — LORAZEPAM 1 MG PO TABS: ORAL_TABLET | ORAL | 1 refills | Status: DC

## 2018-01-26 MED ORDER — CLONAZEPAM 0.5 MG PO TABS: 0.5000 mg | ORAL_TABLET | Freq: Two times a day (BID) | ORAL | 1 refills | Status: DC | PRN

## 2018-01-26 NOTE — Progress Notes (Signed)
Ashley [name] is a 77 y.o. female is here for follow up.  History of Present Illness:   Ashley [name], CMA acting as scribe for Dr. Helane Rima.   HPI: Patient states that she has been having trouble sleeping for over two weeks. She had one night that she did not sleep at all. She has had improvement with increasing her Ativan to 1.5 at night. She has never been on anything in the past to help her sleep. She admits to increased anxiety, including panic attacks. Tried one of her husband's atenolol and felt terrible. No ETOH, drug use, tobacco use. Health anxiety.  Depression screen [facility] 2/9 12/28/2017 12/25/2017 08/15/2017  Decreased Interest 0 0 2  Down, Depressed, Hopeless 0 2 0  PHQ - 2 Score 0 2 2  Altered sleeping - 1 1  Tired, decreased energy - 1 1  Change in appetite - 0 0  Feeling bad or failure about yourself  - 0 0  Trouble concentrating - 0 0  Moving slowly or fidgety/restless - 0 0  Suicidal thoughts - 0 0  PHQ-9 Score - 4 4  Difficult doing work/chores - Somewhat difficult Not difficult at all   PMHx, SurgHx, SocialHx, FamHx, Medications, and Allergies were reviewed in the Visit Navigator and updated as appropriate.   Patient Active Problem List   Diagnosis Date Noted  . Hashimoto's thyroiditis 09/14/2017  . IFG (impaired fasting glucose) 06/16/2016  . Essential hypertension 06/17/2015  . H/O mixed hyperlipidemia 06/17/2015  . Dysthymia 06/17/2015  . Vitamin D deficiency 06/17/2014  . Osteopenia 08/19/2013  . Diverticulosis of large intestine 05/04/2007  . Hypothyroidism 05/03/2007  . Anxiety state 05/03/2007  . Hemorrhoids 05/03/2007   Social History   Tobacco Use  . Smoking status: Never Smoker  . Smokeless tobacco: Never Used  Substance Use Topics  . Alcohol use: No  . Drug use: No   Current Medications and Allergies:   .  amLODipine (NORVASC) 5 MG tablet, TAKE 1 TABLET(5 MG) BY MOUTH DAILY, Disp: 90 tablet, Rfl: 2 .  aspirin 81 MG tablet, Take  81 mg by mouth daily., Disp: , Rfl:  .  Calcium Carb-Cholecalciferol (CALTRATE 600+D) 600-800 MG-UNIT TABS, Take 1 tablet by mouth daily., Disp: , Rfl:  .  fluticasone (FLONASE) 50 MCG/ACT nasal spray, Place 2 sprays into both nostrils daily., Disp: 16 g, Rfl: 2 .  LORazepam (ATIVAN) 1 MG tablet, TAKE 1/2 TO 1 TABLET BY MOUTH THREE TIMES DAILY AS NEEDED, Disp: 90 tablet, Rfl: 0 .  Magnesium Oxide (MAG-200 PO), Take 250 mg by mouth daily. , Disp: , Rfl:  .  Omega-3 Fatty Acids (FISH OIL) 1000 MG CAPS, Take by mouth., Disp: , Rfl:  .  omeprazole (PRILOSEC) 20 MG capsule, Take 1 capsule (20 mg total) by mouth daily., Disp: 30 capsule, Rfl: 3 .  OVER THE COUNTER MEDICATION, Take 1 capsule by mouth daily. Methyl Folate + with L-5-MTHF+Methyl B12, Disp: , Rfl:  .  SYNTHROID 75 MCG tablet, TAKE 1 TABLET BY MOUTH  DAILY BEFORE BREAKFAST, Disp: 90 tablet, Rfl: 1   Allergies  Allergen Reactions  . Alendronate Sodium     REACTION: pt states "blisters in my mouth"  . Other     May be more allergic to milk  . Penicillins Other (See Comments)    REACTION: fatigue/malaise, "feels extremely bad" Did PCN reaction causing immediate rash, facial/tongue/throat swelling, SOB or lightheadedness with hypotension: no Has patient had a PCN reaction causing severe  rash involving mucus membranes or skin necrosis: no Has patient had a PCN reaction that required hospitalization : no Has patient had a PCN reaction occurring within the last 10 years: yes If all of the above answers are "NO", then may proceed with Cephalosporin use. Extremely lethargic  . Raloxifene     REACTION: pt states "it dried me out"  . Risedronate Sodium     REACTION: pt states "teeth problems"  . Citalopram Anxiety  . Latex Rash   Review of Systems   Pertinent items are noted in the HPI. Otherwise, a complete ROS is negative.  Vitals:   Vitals:   01/26/18 1130  BP: 130/74  Pulse: 76  Temp: 98.8 F (37.1 C)  TempSrc: Oral  SpO2:  98%  Weight: 142 lb (64.4 kg)  Height: 5\' 7"  (1.702 m)     Body mass index is 22.24 kg/m.  Physical Exam:   Physical Exam Vitals signs and nursing note reviewed.  HENT:     Head: Normocephalic and atraumatic.  Eyes:     Pupils: Pupils are equal, round, and reactive to light.  Neck:     Musculoskeletal: Normal range of motion and neck supple.  Cardiovascular:     Rate and Rhythm: Normal rate and regular rhythm.     Heart sounds: Normal heart sounds.  Pulmonary:     Effort: Pulmonary effort is normal.  Abdominal:     Palpations: Abdomen is soft.  Skin:    General: Skin is warm.  Psychiatric:        Behavior: Behavior normal.    Assessment and Plan:   Ashley [name] was seen today for follow-up.  Diagnoses and all orders for this visit:  Anxiety state Comments: Long discussion with patient and daughter re: options. Will trial Ativan 1.5 mg q hs, with Klonopin 0.5 mg in am. Note: she has been on Ativan 1 mg for years.  Orders: -     clonazePAM (KLONOPIN) 0.5 MG tablet; Take 1 tablet (0.5 mg total) by mouth 2 (two) times daily as needed for anxiety.  Psychophysiological insomnia -     LORazepam (ATIVAN) 1 MG tablet; 1 1/2 tab bid    . Orders and follow up as documented in EpicCare, reviewed diet, exercise and weight control, cardiovascular risk and specific lipid/LDL goals reviewed, reviewed medications and side effects in detail.  . Reviewed expectations re: course of current medical issues. . Outlined signs and symptoms indicating need for more acute intervention. . Patient verbalized understanding and all questions were answered. . Patient received an After Visit Summary.  CMA served as Neurosurgeonscribe during this visit. History, Physical, and Plan performed by medical provider. The above documentation has been reviewed and is accurate and complete. Ashley RimaErica Everette [name], D.O.  Ashley RimaErica Karlis Cregg, DO [place], [facility] Ashley [name] 01/28/2018

## 2018-02-02 ENCOUNTER — Telehealth: Payer: Self-pay | Admitting: Family Medicine

## 2018-02-02 NOTE — Telephone Encounter (Signed)
Patient called on 02/01/18 at 1:14am and stated "she was on a new regimen of medication and taking lorazepam but was having difficulty sleeping and bad dreams. She stated she had a spell of a short quick breath and the lorazepam woke her up after 2 hours of sleep. BP 174/90 ad 70 bpm.   She was triaged by [facility] nurse and was advised to see PCP within 3 days. Please contact patient to see about working in soon as the front office just received the message via fax today.

## 2018-02-02 NOTE — Telephone Encounter (Signed)
Spoke to pt daughter Sue Lushndrea and gave her the update. She verbalized understanding. Attempted to call pt no answer. Pt did return phone call but triage nurse gave her the message. No further action needed!

## 2018-02-02 NOTE — Telephone Encounter (Signed)
Please advise 

## 2018-02-02 NOTE — Telephone Encounter (Signed)
On Ativan for years. Started low dose Klonopin in am. If feeling more anxious, okay to take Ativan only. Recheck BP. If CP, SOB, dizziness, or other concerning symptoms, to ED.

## 2018-02-09 ENCOUNTER — Other Ambulatory Visit: Payer: Self-pay

## 2018-02-09 ENCOUNTER — Other Ambulatory Visit: Payer: Self-pay | Admitting: Family Medicine

## 2018-02-09 DIAGNOSIS — F5104 Psychophysiologic insomnia: Secondary | ICD-10-CM

## 2018-02-09 NOTE — Telephone Encounter (Signed)
meds pulled up please send to pharmacy for me not able to fax

## 2018-02-09 NOTE — Telephone Encounter (Signed)
Please advise 

## 2018-02-09 NOTE — Telephone Encounter (Signed)
Okay Ativan 0.5 to 1.5 po TID prn anxiety. # 135

## 2018-02-09 NOTE — Telephone Encounter (Signed)
Copied from CRM [id number]. Topic: General - Other >> Feb 09, 2018  2:01 PM Percival SpanishKennedy, Cheryl W wrote:  Pt call to say she was suppose to let Dr Earlene PlaterWallace know which iof the 2 medicines she would like to continue to take    clonazePAM (KLONOPIN) 0.5 MG tablet    LORazepam (ATIVAN) 1 MG tablet   Pt said she choose Lorazepam and will need a new rx  [facility] [address]

## 2018-02-10 MED ORDER — LORAZEPAM 1 MG PO TABS: ORAL_TABLET | ORAL | 0 refills | Status: DC

## 2018-02-14 ENCOUNTER — Emergency Department (HOSPITAL_COMMUNITY)
Admission: EM | Admit: 2018-02-14 | Discharge: 2018-02-14 | Disposition: A | Discharge status: Home / Self Care | Payer: Medicare Other | Attending: Emergency Medicine | Admitting: Emergency Medicine

## 2018-02-14 ENCOUNTER — Emergency Department (HOSPITAL_COMMUNITY): Payer: Medicare Other

## 2018-02-14 DIAGNOSIS — Z79899 Other long term (current) drug therapy: Secondary | ICD-10-CM | POA: Insufficient documentation

## 2018-02-14 DIAGNOSIS — R42 Dizziness and giddiness: Secondary | ICD-10-CM | POA: Insufficient documentation

## 2018-02-14 DIAGNOSIS — I10 Essential (primary) hypertension: Secondary | ICD-10-CM | POA: Insufficient documentation

## 2018-02-14 DIAGNOSIS — Z7982 Long term (current) use of aspirin: Secondary | ICD-10-CM | POA: Insufficient documentation

## 2018-02-14 DIAGNOSIS — Z91040 Latex allergy status: Secondary | ICD-10-CM | POA: Insufficient documentation

## 2018-02-14 LAB — URINALYSIS, ROUTINE W REFLEX MICROSCOPIC
Bilirubin Urine: NEGATIVE
Glucose, UA: NEGATIVE mg/dL
Ketones, ur: 5 mg/dL — AB
Leukocytes, UA: NEGATIVE
Nitrite: NEGATIVE
Protein, ur: NEGATIVE mg/dL
Specific Gravity, Urine: 1.005 (ref 1.005–1.030)
pH: 7 (ref 5.0–8.0)

## 2018-02-14 LAB — BASIC METABOLIC PANEL WITH GFR
Anion gap: 10 (ref 5–15)
BUN: 14 mg/dL (ref 8–23)
CO2: 26 mmol/L (ref 22–32)
Calcium: 9.6 mg/dL (ref 8.9–10.3)
Chloride: 99 mmol/L (ref 98–111)
Creatinine, Ser: 0.73 mg/dL (ref 0.44–1.00)
GFR calc Af Amer: >60 mL/min
GFR calc non Af Amer: >60 mL/min
Glucose, Bld: 125 mg/dL — ABNORMAL HIGH (ref 70–99)
Potassium: 4.3 mmol/L (ref 3.5–5.1)
Sodium: 135 mmol/L (ref 135–145)

## 2018-02-14 LAB — I-STAT TROPONIN, ED: Troponin i, poc: 0 ng/mL (ref 0.00–0.08)

## 2018-02-14 LAB — CBC
HCT: 43 % (ref 36.0–46.0)
Hemoglobin: 13.8 g/dL (ref 12.0–15.0)
MCH: 30.8 pg (ref 26.0–34.0)
MCHC: 32.1 g/dL (ref 30.0–36.0)
MCV: 96 fL (ref 80.0–100.0)
Platelets: 204 K/uL (ref 150–400)
RBC: 4.48 MIL/uL (ref 3.87–5.11)
RDW: 12.9 % (ref 11.5–15.5)
WBC: 5.6 K/uL (ref 4.0–10.5)
nRBC: 0 % (ref 0.0–0.2)

## 2018-02-14 NOTE — ED Provider Notes (Signed)
[facility] EMERGENCY DEPARTMENT Provider Note   CSN: [id number] Arrival date & time: 02/14/18  1037     History   Chief Complaint Chief Complaint  Patient presents with  . Hypertension    HPI Ashley [name] is a 78 y.o. female with a PMHx of anxiety, HTN, hashimoto's thyroiditis, HLD, diverticulosis, and other conditions listed below, who presents to the ED with complaints of "fuzzy feeling" in her head that began this morning.  She states that she felt like her head felt "fuzzy", somewhat like a lightheaded feeling but not dizziness, so she checked her blood pressure and it was 182/112.  She went to the fire station to confirm this blood pressure and there it was 180/96 so she decided to come for evaluation.  She states that it is not a dizzy feeling, the feeling has been intermittent and gradually improving.  She took her amlodipine 5 mg and that seemed to help.  No known aggravating factors.  She states that her vision seemed slightly blurrier than normal this morning as well.  She usually takes amlodipine 2.5 mg twice daily because when she takes the 5 mg dose that she feels "dragged down".  Her blood pressure is usually "stable" throughout the day and this is the first time it spiked up like this.  Of note, she states that she has been recently cutting back on Lorazepam, she is prescribed 0.5 mg twice daily and then 1.5 mg at bedtime but she has been cutting back to 0.25 mg twice daily and 1.5 mg at bedtime.  She has not taken that today.  She denies any caffeine intake except for decaf coffee.  She denies any recent fevers or chills, headache, chest pain, shortness of breath, abdominal pain, nausea, vomiting, diarrhea, constipation, dysuria, hematuria, myalgias, arthralgias, numbness, tingling, focal weakness, or any other complaints at this time.  The history is provided by the patient and medical records. No language interpreter was used.  Hypertension  Pertinent  negatives include no chest pain, no abdominal pain, no headaches and no shortness of breath.    Past Medical History:  Diagnosis Date  . Anxiety state, unspecified   . Diverticulosis of colon (without mention of hemorrhage)   . Osteoporosis, unspecified   . Other chest pain   . Other malaise and fatigue   . Personal history of other malignant neoplasm of skin   . Unspecified hemorrhoids without mention of complication   . Unspecified hypothyroidism     Patient Active Problem List   Diagnosis Date Noted  . Hashimoto's thyroiditis 09/14/2017  . IFG (impaired fasting glucose) 06/16/2016  . Essential hypertension 06/17/2015  . H/O mixed hyperlipidemia 06/17/2015  . Dysthymia 06/17/2015  . Vitamin D deficiency 06/17/2014  . Osteopenia 08/19/2013  . Diverticulosis of large intestine 05/04/2007  . Hypothyroidism 05/03/2007  . Anxiety state 05/03/2007  . Hemorrhoids 05/03/2007    Past Surgical History:  Procedure Laterality Date  . COLONOSCOPY       OB History   No obstetric history on file.      Home Medications    Prior to Admission medications   Medication Sig Start Date End Date Taking? Authorizing Provider  amLODipine (NORVASC) 5 MG tablet TAKE 1 TABLET(5 MG) BY MOUTH DAILY 02/20/17   Michele Mcalpine, MD  aspirin 81 MG tablet Take 81 mg by mouth daily.    [provider]  Calcium Carb-Cholecalciferol (CALTRATE 600+D) 600-800 MG-UNIT TABS Take 1 tablet by mouth daily.  [provider]  fluticasone (FLONASE) 50 MCG/ACT nasal spray Place 2 sprays into both nostrils daily. 01/10/18   Jarold MottoWorley, Samantha, PA  LORazepam (ATIVAN) 1 MG tablet TAKE 1/2 TO 1 TABLET BY MOUTH THREE TIMES DAILY AS NEEDED 12/19/17   Helane RimaWallace, Erica, DO  LORazepam (ATIVAN) 1 MG tablet 1/2 to 1 1/2 tid prn anxiety 02/10/18   Helane RimaWallace, Erica, DO  Magnesium Oxide (MAG-200 PO) Take 250 mg by mouth daily.     [provider]  Omega-3 Fatty Acids (FISH OIL) 1000 MG CAPS Take by mouth.     [provider]  omeprazole (PRILOSEC) 20 MG capsule Take 1 capsule (20 mg total) by mouth daily. 02/24/17   Helane RimaWallace, Erica, DO  OVER THE COUNTER MEDICATION Take 1 capsule by mouth daily. Methyl Folate + with L-5-MTHF+Methyl B12    [provider]  SYNTHROID 75 MCG tablet TAKE 1 TABLET BY MOUTH  DAILY BEFORE BREAKFAST 10/25/17   Helane RimaWallace, Erica, DO    Family History Family History  Problem Relation Age of Onset  . Ovarian cancer Mother   . Pancreatic cancer Brother   . Diabetes Unknown   . Prostate cancer Brother     Social History Social History   Tobacco Use  . Smoking status: Never Smoker  . Smokeless tobacco: Never Used  Substance Use Topics  . Alcohol use: No  . Drug use: No     Allergies   Alendronate sodium; Other; Penicillins; Raloxifene; Risedronate sodium; Citalopram; and Latex   Review of Systems Review of Systems  Constitutional: Negative for chills and fever.  Eyes: Positive for visual disturbance.  Respiratory: Negative for shortness of breath.   Cardiovascular: Negative for chest pain.  Gastrointestinal: Negative for abdominal pain, constipation, diarrhea, nausea and vomiting.  Genitourinary: Negative for dysuria and hematuria.  Musculoskeletal: Negative for arthralgias and myalgias.  Skin: Negative for color change.  Allergic/Immunologic: Negative for immunocompromised state.  Neurological: Positive for light-headedness. Negative for weakness, numbness and headaches.  Psychiatric/Behavioral: Negative for confusion.   All other systems reviewed and are negative for acute change except as noted in the HPI.    Physical Exam Updated Vital Signs BP (!) 166/88 (BP Location: Left Arm)   Pulse 86   Temp 97.8 F (36.6 C) (Oral)   Resp 20   SpO2 98%   Physical Exam Vitals signs and nursing note reviewed.  Constitutional:      General: She is not in acute distress.    Appearance: Normal appearance. She is well-developed. She is not  toxic-appearing.     Comments: Afebrile, nontoxic, NAD, BP 166/88  HENT:     Head: Normocephalic and atraumatic.  Eyes:     General: Vision grossly intact.        Right eye: No discharge.        Left eye: No discharge.     Extraocular Movements: Extraocular movements intact.     Conjunctiva/sclera: Conjunctivae normal.     Pupils: Pupils are equal, round, and reactive to light.     Comments: PERRL, EOMI, no nystagmus  Neck:     Musculoskeletal: Normal range of motion and neck supple. Normal range of motion. No neck rigidity, spinous process tenderness or muscular tenderness.     Comments: FROM intact without spinous process TTP, no bony stepoffs or deformities, no paraspinous muscle TTP or muscle spasms. No rigidity or meningeal signs. No bruising or swelling.  Cardiovascular:     Rate and Rhythm: Normal rate and regular rhythm.  Pulses: Normal pulses.     Heart sounds: Normal heart sounds, S1 normal and S2 normal. No murmur. No friction rub. No gallop.   Pulmonary:     Effort: Pulmonary effort is normal. No respiratory distress.     Breath sounds: Normal breath sounds. No decreased breath sounds, wheezing, rhonchi or rales.  Abdominal:     General: Bowel sounds are normal. There is no distension.     Palpations: Abdomen is soft. Abdomen is not rigid.     Tenderness: There is no abdominal tenderness. There is no right CVA tenderness, left CVA tenderness, guarding or rebound. Negative signs include Murphy's sign and McBurney's sign.  Musculoskeletal: Normal range of motion.     Comments: MAE x4 Strength and sensation grossly intact in all extremities Distal pulses intact Gait steady  Skin:    General: Skin is warm and dry.     Findings: No rash.  Neurological:     General: No focal deficit present.     Mental Status: She is alert and oriented to person, place, and time.     GCS: GCS eye subscore is 4. GCS verbal subscore is 5. GCS motor subscore is 6.     Cranial Nerves:  Cranial nerves are intact. No cranial nerve deficit.     Sensory: Sensation is intact. No sensory deficit.     Motor: Motor function is intact.     Coordination: Coordination is intact. Coordination normal.     Gait: Gait normal.     Comments: CN 2-12 grossly intact A&O x4 GCS 15 Sensation and strength intact Gait nonataxic including with tandem walking Coordination with finger-to-nose WNL Neg pronator drift   Psychiatric:        Mood and Affect: Mood and affect normal.        Behavior: Behavior normal.      ED Treatments / Results  Labs (all labs ordered are listed, but only abnormal results are displayed) Labs Reviewed  BASIC METABOLIC PANEL - Abnormal; Notable for the following components:      Result Value   Glucose, Bld 125 (*)    All other components within normal limits  URINALYSIS, ROUTINE W REFLEX MICROSCOPIC - Abnormal; Notable for the following components:   Color, Urine STRAW (*)    Hgb urine dipstick SMALL (*)    Ketones, ur 5 (*)    Bacteria, UA RARE (*)    All other components within normal limits  CBC  I-STAT TROPONIN, ED    EKG EKG Interpretation  Date/Time:  Wednesday February 14 2018 11:02:56 EST Ventricular Rate:  73 PR Interval:  170 QRS Duration: 78 QT Interval:  368 QTC Calculation: 405 R Axis:   82 Text Interpretation:  Normal sinus rhythm Anteroseptal infarct , age undetermined Abnormal ECG No old tracing to compare Confirmed by Mancel Bale [phone]) on 02/14/2018 1:04:10 PM   Radiology Dg Chest 2 View  Result Date: 02/14/2018 CLINICAL DATA:  Patient with palpitations EXAM: CHEST - 2 VIEW COMPARISON:  Chest radiograph 06/17/2015 FINDINGS: Stable cardiac and mediastinal contours. No consolidative pulmonary opacities. No pleural effusion or pneumothorax. Thoracic spine degenerative changes. IMPRESSION: No acute cardiopulmonary process. Electronically Signed   By: Annia Belt M.D.   On: 02/14/2018 11:51   Ct Head Wo Contrast  Result Date:  02/14/2018 CLINICAL DATA:  Ct head wo, Pt here with c/o htn , pt has history of same , pt took her meds this morning , s/bp 186 at home EXAM: CT HEAD WITHOUT  CONTRAST TECHNIQUE: Contiguous axial images were obtained from the base of the skull through the vertex without intravenous contrast. COMPARISON:  None. FINDINGS: Brain: Diffuse parenchymal atrophy. Patchy areas of hypoattenuation in deep and periventricular white matter bilaterally. Negative for acute intracranial hemorrhage, mass lesion, acute infarction, midline shift, or mass-effect. Acute infarct may be inapparent on noncontrast CT. Ventricles and sulci symmetric. Vascular: Atherosclerotic and physiologic intracranial calcifications. Skull: Normal. Negative for fracture or focal lesion. Sinuses/Orbits: No acute finding. Other: None. IMPRESSION: 1. Negative for bleed or other acute intracranial process. 2. Atrophy and nonspecific white matter changes. Electronically Signed   By: Corlis Leak M.D.   On: 02/14/2018 13:36    Procedures Procedures (including critical care time)  Medications Ordered in ED Medications - No data to display   Initial Impression / Assessment and Plan / ED Course  I have reviewed the triage vital signs and the nursing notes.  Pertinent labs & imaging results that were available during my care of the patient were reviewed by me and considered in my medical decision making (see chart for details).     78 y.o. female here with hypertension and lightheaded feeling when she woke up this morning.  She states that her head felt "fuzzy", intermittently, and is improving.  She checked her blood pressure and it was 180s/110s, went to the fire department and it was 180/96, so she came here.  On exam, no focal neuro deficits, blood pressure 166/88, physical exam benign.  Work-up thus far reveals troponin negative, CBC WNL, BMP with gluc 125 but otherwise WNL, CXR neg, EKG without acute ischemic findings. Will get CT head to ensure  no other abnormalities and reassess shortly. Pt requesting to take her 0.25mg  lorazepam which she normally would have taken by now, advised that this is fine. Discussed case with my attending Dr. Effie Shy who agrees with plan.   3:07 PM CT head negative for acute findings. U/A unremarkable without evidence of UTI. Overall reassuring work up, BP gradually improving. I suspect her BP spiking may be a result of her taking her BP meds incorrectly/not as prescribed as well as some anxiety. Advised taking 5mg  of amlodipine rather than splitting it up, take at bedtime so she doesn't feel so "dragged down". Advised keeping a log of BPs at home to take to her PCP at next visit. Discussed avoidance of triggers. F/up with PCP in 3-5 days for recheck. I explained the diagnosis and have given explicit precautions to return to the ER including for any other new or worsening symptoms. The patient understands and accepts the medical plan as it's been dictated and I have answered their questions. Discharge instructions concerning home care and prescriptions have been given. The patient is STABLE and is discharged to home in good condition.    Final Clinical Impressions(s) / ED Diagnoses   Final diagnoses:  Essential hypertension  Lightheadedness    ED Discharge Orders    [address] [place], [place] 02/14/18 1507    Mancel Bale, MD 02/14/18 1807

## 2018-02-14 NOTE — ED Triage Notes (Signed)
Pt here with c/o htn , pt has history of same , pt took her meds this morning , s/bp 186 at home

## 2018-02-14 NOTE — ED Notes (Signed)
Patient verbalizes understanding of discharge instructions. Opportunity for questioning and answers were provided. 

## 2018-02-14 NOTE — ED Notes (Signed)
Pt currently in xray

## 2018-02-14 NOTE — Discharge Instructions (Signed)
Your blood pressure was elevated today. Eat a low salt/low sodium diet, and take all of your home blood pressure medications as directed. Keep a log of your blood pressure readings from every morning and evening (making sure to give yourself at least 15 minutes of rest prior to checking it) and take it to your doctor's office at your next appointment for ongoing management of your blood pressure. Stay well hydrated and get plenty of rest. Avoid caffeine and other over the counter products that would make your blood pressure go up (such as decongestants, excedrin, etc). Follow up with your regular doctor in 1 week for recheck of symptoms and ongoing management of your blood pressure. Return to the ER for emergent changes or worsening symptoms.

## 2018-02-14 NOTE — ED Provider Notes (Signed)
  Face-to-face evaluation   History: She complains of high blood pressure which she noticed today.  She takes her blood pressure medication, Norvasc, irregularly, and in varying doses.  She typically takes 5 mg/day but is prescribed 10 mg.  Sometimes when her pressure is high she will take 10 mg dose.  She stays on a low-salt diet.  She has not discussed the medication modifications, with her PCP.  Physical exam: Alert, calm, cooperative.  No dysarthria, aphasia or nystagmus.  Normal strength arms and legs bilaterally.  Medical screening examination/treatment/procedure(s) were conducted as a shared visit with non-physician practitioner(s) and myself.  I personally evaluated the patient during the encounter    Mancel Bale, MD 02/14/18 [phone]

## 2018-03-15 ENCOUNTER — Ambulatory Visit: Payer: Self-pay | Admitting: Family Medicine

## 2018-03-15 NOTE — Telephone Encounter (Signed)
FYI

## 2018-03-15 NOTE — Telephone Encounter (Signed)
See note

## 2018-03-15 NOTE — Telephone Encounter (Signed)
Pt. Reports she has had trouble sleeping x 1 month. Takes Ativan and melatonin, "but it has not been helping." Some nights she can get to sleep, but wakes up in the middle of the night. Some nights can't sleep at all.Offered an OV, but pt. States she will call her GYN. Instructed to call back if needed. Answer Assessment - Initial Assessment Questions 1. DESCRIPTION: "Tell me about your sleeping problem."      Sometimes she can get to sleep , but then wakes up. Sometimes can't sleep at all 2. ONSET: "How long have you been having trouble sleeping?" (e.g., days, weeks, months)     1 month 3. RECURRENT: "Have you had sleeping problems before?"  If yes: "What happened that time?" "What helped your sleeping problem go away in the past?"      Ativan has helped "for years" 4. STRESS: "Is there anything in your life that is making you feel stressed or tense?"     No 5. PAIN: "Do you have any pain that is keeping you awake?" (e.g., back pain, headache, abdominal pain)     No 6. CAFFEINE ABUSE: "Do you drink caffeinated beverages, and how much each day?" (e.g., coffee, tea, colas)     No caffeine 7. SUBSTANCE ABUSE: "Do you use any illegal drugs or alcohol?"     No 8. OTHER SYMPTOMS: "Do you have any other symptoms?"  (e.g., difficulty breathing)     No  Protocols used: INSOMNIA-A-AH

## 2018-03-22 ENCOUNTER — Telehealth: Payer: Self-pay | Admitting: Family Medicine

## 2018-03-22 NOTE — Telephone Encounter (Signed)
Please advise if pt can be worked in or should take her husbands appt.   Copied from CRM [id number]. Topic: Appointment Scheduling - Prior Auth Required for Appointment >> Mar 22, 2018  2:34 PM Ashley [name] wrote: Patient would like to be worked in sooner than Dr Philis Pique next appt, she said she is not sleeping and it is taking a toll on her health. She said her husband has an appt with Dr Earlene Plater on 2/11 and if she has to she will take that appt and he will re-schedule. Please advise.

## 2018-03-23 NOTE — Telephone Encounter (Signed)
Left message to return call to our office.  

## 2018-03-23 NOTE — Telephone Encounter (Signed)
Called patient app made  

## 2018-03-25 NOTE — Progress Notes (Signed)
Ashley JarvisLinda S [name] is a 78 y.o. female is here for follow up.  History of Present Illness:   Ashley [name], CMA acting as scribe for Dr. Helane RimaErica Savas [name].   HPI:   Sleeping: She states that it has improved after seeing Ashley [name] and starting Prozac. She has had improvement in this for the last three nights.   Hypertension: She has started taking her amlodipine every day at 3pm in the afternoon. She states for about three hours she has a lot of problems with fatigue then it goes away.  She keeps diligent records of her blood pressure.  Ranges from 110s to 150s.  Heart rate stays 70s to 90s.  She admits that her blood pressure does seem to normalize when she takes lorazepam.  She reports that she and her husband had a GI illness last week that is now resolved.  She did call Ashley [name] about her increased anxiety last week.  He obtained labs on Friday and she has not heard about those results yet.  Since changing her medication to afternoon, she has been experiencing some epigastric discomfort.  She takes omeprazole as needed.  No nausea or vomiting.  No melena.    Denies chest pain, shortness of breath, headaches, dizziness.  There are no preventive care reminders to display for this patient. [facility] 03/26/2018 12/28/2017 12/25/2017  Decreased Interest 0 0 0  Down, Depressed, Hopeless 0 0 2  PHQ - 2 Score 0 0 2  Altered sleeping 0 - 1  Tired, decreased energy 1 - 1  Change in appetite 1 - 0  Feeling bad or failure about yourself  0 - 0  Trouble concentrating 0 - 0  Moving slowly or fidgety/restless 0 - 0  Suicidal thoughts 0 - 0  PHQ-9 Score 2 - 4  Difficult doing work/chores Somewhat difficult - Somewhat difficult   PMHx, SurgHx, SocialHx, FamHx, Medications, and Allergies were reviewed in the Visit Navigator and updated as appropriate.   Patient Active Problem List   Diagnosis Date Noted  . Dyspepsia 03/26/2018  . Psychophysiological insomnia 03/26/2018  . Hashimoto's  thyroiditis 09/14/2017  . IFG (impaired fasting glucose) 06/16/2016  . Essential hypertension 06/17/2015  . H/O mixed hyperlipidemia 06/17/2015  . Dysthymia 06/17/2015  . Vitamin D deficiency 06/17/2014  . Osteopenia 08/19/2013  . Diverticulosis of large intestine 05/04/2007  . Hypothyroidism 05/03/2007  . Anxiety state 05/03/2007  . Hemorrhoids 05/03/2007   Social History   Tobacco Use  . Smoking status: Never Smoker  . Smokeless tobacco: Never Used  Substance Use Topics  . Alcohol use: No  . Drug use: No   Current Medications and Allergies   .  amLODipine (NORVASC) 5 MG tablet, TAKE 1 TABLET(5 MG) BY MOUTH DAILY (Patient taking differently: Take 5 mg by mouth daily. ), Disp: 90 tablet, Rfl: 2 .  aspirin 81 MG tablet, Take 81 mg by mouth at bedtime. , Disp: , Rfl:  .  Calcium Carb-Cholecalciferol (CALTRATE 600+D) 600-800 MG-UNIT TABS, Take 1 tablet by mouth daily., Disp: , Rfl:  .  fluticasone (FLONASE) 50 MCG/ACT nasal spray, Place 2 sprays into both nostrils daily. (Patient not taking: Reported on 02/14/2018), Disp: 16 g, Rfl: 2 .  LORazepam (ATIVAN) 1 MG tablet, TAKE 1/2 TO 1 TABLET BY MOUTH THREE TIMES DAILY AS NEEDED (Patient taking differently: Take 0.5-1 mg by mouth 3 (three) times daily as needed for anxiety. ), Disp: 90 tablet, Rfl: 0 .  Magnesium 250 MG TABS, Take  250 mg by mouth., Disp: , Rfl:  .  Omega-3 Fatty Acids (FISH OIL) 1000 MG CAPS, Take 1,000 mg by mouth 2 (two) times a week. , Disp: , Rfl:  .  omeprazole (PRILOSEC) 20 MG capsule, Take 1 capsule (20 mg total) by mouth daily. (Patient taking differently: Take 20 mg by mouth daily as needed (for heartburn symptoms). ), Disp: 30 capsule, Rfl: 3 .  OVER THE COUNTER MEDICATION, Take 1 capsule by mouth See admin instructions. (Methyl-B Complete) Methyl Folate + with L-5-MTHF+Methyl B12 capsule = Take 1 capsule by mouth once a day, Disp: , Rfl:  .  SYNTHROID 75 MCG tablet, TAKE 1 TABLET BY MOUTH  DAILY BEFORE BREAKFAST  (Patient taking differently: Take 75 mcg by mouth daily before breakfast. ), Disp: 90 tablet, Rfl: 1   Allergies  Allergen Reactions  . Alendronate Sodium Other (See Comments)    FOSAMAX: Patient states "blisters in my mouth"  . Penicillins Other (See Comments)    Caused fatigue/malaise, "feels extremely bad" Did PCN reaction causing immediate rash, facial/tongue/throat swelling, SOB or lightheadedness with hypotension: No Has patient had a PCN reaction causing severe rash involving mucus membranes or skin necrosis: No Has patient had a PCN reaction that required hospitalization: No Has patient had a PCN reaction occurring within the last 10 years: Yes If all of the above answers are "NO", then may proceed with Cephalosporin use. Extremely lethargic  . Raloxifene Other (See Comments)    EVISTA: Patient states "it dried me out"  . Risedronate Sodium Other (See Comments)    ACTONEL: Patient states "teeth problems" (??)  . Shellfish-Derived Products Itching    Throat started to itch  . Citalopram Anxiety  . Latex Rash   Review of Systems   Pertinent items are noted in the HPI. Otherwise, a complete ROS is negative.  Vitals   Vitals:   03/26/18 0806  BP: (!) 148/72  Pulse: 86  Temp: 97.8 F (36.6 C)  TempSrc: Oral  SpO2: 97%  Weight: 139 lb 9.6 oz (63.3 kg)  Height: 5\' 7"  (1.702 m)     Body mass index is 21.86 kg/m.  Physical Exam   Physical Exam Vitals signs and nursing note reviewed.  HENT:     Head: Normocephalic and atraumatic.  Eyes:     Pupils: Pupils are equal, round, and reactive to light.  Neck:     Musculoskeletal: Normal range of motion and neck supple.  Cardiovascular:     Rate and Rhythm: Normal rate and regular rhythm.     Heart sounds: Normal heart sounds.  Pulmonary:     Effort: Pulmonary effort is normal.  Abdominal:     Palpations: Abdomen is soft.  Skin:    General: Skin is warm.  Psychiatric:        Behavior: Behavior normal.     Assessment and Plan   Ashley [name] was seen today for insomnia.  Diagnoses and all orders for this visit:  Essential hypertension Hypothyroidism due to Hashimoto's thyroiditis Anxiety state Psychophysiological insomnia Dyspepsia  Long discussion with patient today regarding medications.  She will decrease her Norvasc to 2.5 mg to take it twice daily.  I do believe she is developing some gastritis, so I encouraged her to take her omeprazole daily.  We discussed taking Pepcid as needed as well.  The Prozac was a great choice for her anxiety and insomnia.  It is already helping.  We discussed expectations going forward with this medication.  See AVS for  more.  . Orders and follow up as documented in EpicCare, reviewed diet, exercise and weight control, cardiovascular risk and specific lipid/LDL goals reviewed, reviewed medications and side effects in detail.  . Reviewed expectations re: course of current medical issues. . Outlined signs and symptoms indicating need for more acute intervention. . Patient verbalized understanding and all questions were answered. . Patient received an After Visit Summary.  CMA served as Neurosurgeon during this visit. History, Physical, and Plan performed by medical provider. The above documentation has been reviewed and is accurate and complete. Helane [name], D.O.  Helane Rima, DO [name] 03/26/2018

## 2018-03-26 ENCOUNTER — Ambulatory Visit: Payer: Medicare Other | Admitting: Family Medicine

## 2018-03-26 ENCOUNTER — Encounter: Payer: Self-pay | Admitting: Family Medicine

## 2018-03-26 VITALS — BP 148/72 | HR 86 | Temp 97.8°F | Ht 67.0 in | Wt 139.6 lb

## 2018-03-26 DIAGNOSIS — E038 Other specified hypothyroidism: Secondary | ICD-10-CM | POA: Diagnosis not present

## 2018-03-26 DIAGNOSIS — F411 Generalized anxiety disorder: Secondary | ICD-10-CM | POA: Diagnosis not present

## 2018-03-26 DIAGNOSIS — F5104 Psychophysiologic insomnia: Secondary | ICD-10-CM

## 2018-03-26 DIAGNOSIS — I10 Essential (primary) hypertension: Secondary | ICD-10-CM | POA: Diagnosis not present

## 2018-03-26 DIAGNOSIS — R1013 Epigastric pain: Secondary | ICD-10-CM

## 2018-03-26 DIAGNOSIS — E063 Autoimmune thyroiditis: Secondary | ICD-10-CM

## 2018-03-26 NOTE — Patient Instructions (Signed)
Take these medications as below:  1. Amlodipine 2.5 mg by mouth in am and pm. 2. Omeprazole 20 mg by mouth in pm. 3. Pepcid 20 mg daily as needed for stomach irritation.  Stop the magnesium.  I think that the Prozac is a great choice. It will take about 4 weeks before you see the full effect. Continue to Lorazepam as needed for now. Once you are in a good place, you can slowly wean the Lorazepam.   B12 comes in a dissolvable form. Pepcid comes in a dissolvable/chewable form.

## 2018-03-30 ENCOUNTER — Telehealth: Payer: Self-pay | Admitting: Family Medicine

## 2018-03-30 ENCOUNTER — Encounter: Payer: Self-pay | Admitting: Physician Assistant

## 2018-03-30 ENCOUNTER — Ambulatory Visit (INDEPENDENT_AMBULATORY_CARE_PROVIDER_SITE_OTHER): Payer: Medicare Other | Admitting: Physician Assistant

## 2018-03-30 ENCOUNTER — Ambulatory Visit: Payer: Self-pay

## 2018-03-30 ENCOUNTER — Ambulatory Visit (INDEPENDENT_AMBULATORY_CARE_PROVIDER_SITE_OTHER): Payer: Medicare Other

## 2018-03-30 VITALS — BP 110/72 | HR 64 | Temp 97.6°F | Ht 67.0 in | Wt 138.4 lb

## 2018-03-30 DIAGNOSIS — R197 Diarrhea, unspecified: Secondary | ICD-10-CM | POA: Diagnosis not present

## 2018-03-30 MED ORDER — PROMETHAZINE HCL 12.5 MG PO TABS: 12.5000 mg | ORAL_TABLET | Freq: Four times a day (QID) | ORAL | 0 refills | Status: DC | PRN

## 2018-03-30 NOTE — Telephone Encounter (Signed)
See note  Copied from CRM [id number]. Topic: General - Inquiry >> Mar 30, 2018  8:00 AM Windy Kalata, NT wrote: Reason for CRM: patient is calling and was seen in the office on 03/26/18 for diarrhea. She states it got better and this morning she starting with it again and states this is day 10.

## 2018-03-30 NOTE — Progress Notes (Signed)
Ashley [name] is a 78 y.o. female here for a follow up of a pre-existing problem.  I acted as a Neurosurgeonscribe for Energy East CorporationSamantha Iliani Vejar, PA-C Corky Mullonna Orphanos, LPN  History of Present Illness:   Chief Complaint  Patient presents with  . Diarrhea    Diarrhea   This is a recurrent problem. Episode onset: Started 2 weeks ago. The problem occurs less than 2 times per day. The problem has been waxing and waning. The stool consistency is described as watery. The patient states that diarrhea awakens (Today) her from sleep. Associated symptoms include bloating. Pertinent negatives include no chills, fever or headaches. Associated symptoms comments: Nausea, indigestion. The symptoms are aggravated by dairy products. There are no known risk factors. She has tried anti-motility drug (Imodium) for the symptoms. The treatment provided moderate relief. There is no history of inflammatory bowel disease, irritable bowel syndrome or a recent abdominal surgery.   Was told to take Imodium as needed for her diarrhea. She is present with her husband who is complaining of similar issues.  Stopped Magnesium. Not taking Pepcid regularly. Does take omeprazole on a regular basis -- this really helps her acid reflux. Does feel like she had some loose stools the day prior to starting Prozac.   Yesterday had some formed stools, today had some Bristol Stool 6 and 7.   Denies: rectal pain, abdominal pain, blood in stools, lightheadedness, dizziness  She feels as though she is well hydrated.  Wt Readings from Last 5 Encounters:  03/30/18 138 lb 6.1 oz (62.8 kg)  03/26/18 139 lb 9.6 oz (63.3 kg)  01/26/18 142 lb (64.4 kg)  01/10/18 142 lb (64.4 kg)  12/28/17 142 lb (64.4 kg)      Past Medical History:  Diagnosis Date  . Anxiety state, unspecified   . Diverticulosis of colon (without mention of hemorrhage)   . Osteoporosis, unspecified   . Other chest pain   . Other malaise and fatigue   . Personal history of other  malignant neoplasm of skin   . Unspecified hemorrhoids without mention of complication   . Unspecified hypothyroidism      Social History   Socioeconomic History  . Marital status: Married    Spouse name: Molly MaduroRobert  . Number of children: 2  . Years of education: Not on file  . Highest education level: Not on file  Occupational History  . Occupation: Retired  Engineer, productionocial Needs  . Financial resource strain: Not on file  . Food insecurity:    Worry: Not on file    Inability: Not on file  . Transportation needs:    Medical: Not on file    Non-medical: Not on file  Tobacco Use  . Smoking status: Never Smoker  . Smokeless tobacco: Never Used  Substance and Sexual Activity  . Alcohol use: No  . Drug use: No  . Sexual activity: Not on file  Lifestyle  . Physical activity:    Days per week: Not on file    Minutes per session: Not on file  . Stress: Not on file  Relationships  . Social connections:    Talks on phone: Not on file    Gets together: Not on file    Attends religious service: Not on file    Active member of club or organization: Not on file    Attends meetings of clubs or organizations: Not on file    Relationship status: Not on file  . Intimate partner violence:    Fear  of current or ex partner: Not on file    Emotionally abused: Not on file    Physically abused: Not on file    Forced sexual activity: Not on file  Other Topics Concern  . Not on file  Social History Narrative  . Not on file    Past Surgical History:  Procedure Laterality Date  . COLONOSCOPY      Family History  Problem Relation Age of Onset  . Ovarian cancer Mother   . Pancreatic cancer Brother   . Diabetes Unknown   . Prostate cancer Brother     Allergies  Allergen Reactions  . Alendronate Sodium Other (See Comments)    FOSAMAX: Patient states "blisters in my mouth"  . Penicillins Other (See Comments)    Caused fatigue/malaise, "feels extremely bad" Did PCN reaction causing  immediate rash, facial/tongue/throat swelling, SOB or lightheadedness with hypotension: No Has patient had a PCN reaction causing severe rash involving mucus membranes or skin necrosis: No Has patient had a PCN reaction that required hospitalization: No Has patient had a PCN reaction occurring within the last 10 years: Yes If all of the above answers are "NO", then may proceed with Cephalosporin use. Extremely lethargic  . Raloxifene Other (See Comments)    EVISTA: Patient states "it dried me out"  . Risedronate Sodium Other (See Comments)    ACTONEL: Patient states "teeth problems" (??)  . Shellfish-Derived Products Itching    Throat started to itch  . Citalopram Anxiety  . Latex Rash    Current Medications:   Current Outpatient Medications:  .  amLODipine (NORVASC) 5 MG tablet, TAKE 1 TABLET(5 MG) BY MOUTH DAILY (Patient taking differently: Take 5 mg by mouth daily. ), Disp: 90 tablet, Rfl: 2 .  aspirin 81 MG tablet, Take 81 mg by mouth at bedtime. , Disp: , Rfl:  .  Calcium Carb-Cholecalciferol (CALTRATE 600+D) 600-800 MG-UNIT TABS, Take 1 tablet by mouth daily., Disp: , Rfl:  .  FLUoxetine (PROZAC) 20 MG capsule, Take 1 capsule by mouth daily., Disp: , Rfl:  .  LORazepam (ATIVAN) 1 MG tablet, TAKE 1/2 TO 1 TABLET BY MOUTH THREE TIMES DAILY AS NEEDED (Patient taking differently: Take 0.5-1 mg by mouth 3 (three) times daily as needed for anxiety. ), Disp: 90 tablet, Rfl: 0 .  omeprazole (PRILOSEC) 20 MG capsule, Take 1 capsule (20 mg total) by mouth daily. (Patient taking differently: Take 20 mg by mouth daily as needed (for heartburn symptoms). ), Disp: 30 capsule, Rfl: 3 .  OVER THE COUNTER MEDICATION, Take 1 capsule by mouth See admin instructions. (Methyl-B Complete) Methyl Folate + with L-5-MTHF+Methyl B12 capsule = Take 1 capsule by mouth once a day, Disp: , Rfl:  .  SYNTHROID 75 MCG tablet, TAKE 1 TABLET BY MOUTH  DAILY BEFORE BREAKFAST (Patient taking differently: Take 75 mcg by  mouth daily before breakfast. ), Disp: 90 tablet, Rfl: 1 .  promethazine (PHENERGAN) 12.5 MG tablet, Take 1 tablet (12.5 mg total) by mouth every 6 (six) hours as needed for nausea or vomiting., Disp: 30 tablet, Rfl: 0   Review of Systems:   Review of Systems  Constitutional: Negative for chills and fever.  Cardiovascular: Negative for chest pain and palpitations.  Gastrointestinal: Positive for bloating and diarrhea.  Genitourinary: Negative for dysuria, frequency and urgency.  Neurological: Negative for dizziness, sensory change and headaches.  Psychiatric/Behavioral: Negative for depression. The patient is nervous/anxious and has insomnia.     Vitals:   Vitals:  03/30/18 1503  BP: 110/72  Pulse: 64  Temp: 97.6 F (36.4 C)  TempSrc: Oral  SpO2: 96%  Weight: 138 lb 6.1 oz (62.8 kg)  Height: 5\' 7"  (1.702 m)     Body mass index is 21.67 kg/m.  Physical Exam:   Physical Exam Vitals signs and nursing note reviewed.  Constitutional:      General: She is not in acute distress.    Appearance: She is well-developed. She is not ill-appearing or toxic-appearing.  Cardiovascular:     Rate and Rhythm: Normal rate and regular rhythm.     Pulses: Normal pulses.     Heart sounds: Normal heart sounds, S1 normal and S2 normal.     Comments: No LE edema Pulmonary:     Effort: Pulmonary effort is normal.     Breath sounds: Normal breath sounds.  Abdominal:     General: Abdomen is flat. Bowel sounds are normal.     Palpations: Abdomen is soft.     Tenderness: There is no abdominal tenderness.  Skin:    General: Skin is warm and dry.  Neurological:     Mental Status: She is alert.     GCS: GCS eye subscore is 4. GCS verbal subscore is 5. GCS motor subscore is 6.  Psychiatric:        Mood and Affect: Mood is anxious.        Speech: Speech normal.        Behavior: Behavior normal. Behavior is cooperative.     Abd xray -- pending  Assessment and Plan:   Hassie was seen today  for diarrhea.  Diagnoses and all orders for this visit:  Diarrhea, unspecified type No red flags on exam. Abd xray pending. She is concerned that she and her husband and passing back and forth a GI bug, requesting stool studies, they have been given. Labs pending. Work on bland diet, hydration, ambulation as able to help with gas. Push fluids. ER precautions advised. Discussed that she could have some intolerance to Prozac but she states that she doesn't think that's going on. Phenergan also given -- sleepy precautions advised. Follow-up next week if symptoms persist. -     CBC with Differential/Platelet -     Comprehensive metabolic panel -     Ova and parasite examination; Future -     Gastrointestinal Pathogen Panel PCR; Future  Other orders -     promethazine (PHENERGAN) 12.5 MG tablet; Take 1 tablet (12.5 mg total) by mouth every 6 (six) hours as needed for nausea or vomiting.    . Reviewed expectations re: course of current medical issues. . Discussed self-management of symptoms. . Outlined signs and symptoms indicating need for more acute intervention. . Patient verbalized understanding and all questions were answered. . See orders for this visit as documented in the electronic medical record. . Patient received an After-Visit Summary.  CMA or LPN served as scribe during this visit. History, Physical, and Plan performed by medical provider. The above documentation has been reviewed and is accurate and complete.  I spent 40 minutes with this patient, greater than 50% was face-to-face time counseling regarding the above diagnoses -- specifically diarrhea causes, interventions and treatments.   Jarold Motto, PA-C

## 2018-03-30 NOTE — Telephone Encounter (Signed)
Pt. Reports her and her husband have had diarrhea x 10 days. Thought at one point it was getting better, and then restarts. Not having numerous loose stools per day, but stool is watery. No abdominal pain or fever. Has not been on antibiotic recently. Has tried Pepto and Imodium. Appointment made for today.   Reason for Disposition . [1] MODERATE diarrhea (e.g., 4-6 times / day more than normal) AND [2] present > 48 hours (2 days)  Answer Assessment - Initial Assessment Questions 1. DIARRHEA SEVERITY: "How bad is the diarrhea?" "How many extra stools have you had in the past 24 hours than normal?"    - NO DIARRHEA (SCALE 0)   - MILD (SCALE 1-3): Few loose or mushy BMs; increase of 1-3 stools over normal daily number of stools; mild increase in ostomy output.   -  MODERATE (SCALE 4-7): Increase of 4-6 stools daily over normal; moderate increase in ostomy output. * SEVERE (SCALE 8-10; OR 'WORST POSSIBLE'): Increase of 7 or more stools daily over normal; moderate increase in ostomy output; incontinence.     X 1  2. ONSET: "When did the diarrhea begin?"      10 days ago 3. BM CONSISTENCY: "How loose or watery is the diarrhea?"      Watery 4. VOMITING: "Are you also vomiting?" If so, ask: "How many times in the past 24 hours?"      No vomiting 5. ABDOMINAL PAIN: "Are you having any abdominal pain?" If yes: "What does it feel like?" (e.g., crampy, dull, intermittent, constant)      No pain 6. ABDOMINAL PAIN SEVERITY: If present, ask: "How bad is the pain?"  (e.g., Scale 1-10; mild, moderate, or severe)   - MILD (1-3): doesn't interfere with normal activities, abdomen soft and not tender to touch    - MODERATE (4-7): interferes with normal activities or awakens from sleep, tender to touch    - SEVERE (8-10): excruciating pain, doubled over, unable to do any normal activities       No pain 7. ORAL INTAKE: If vomiting, "Have you been able to drink liquids?" "How much fluids have you had in the past  24 hours?"     Drinking well 8. HYDRATION: "Any signs of dehydration?" (e.g., dry mouth [not just dry lips], too weak to stand, dizziness, new weight loss) "When did you last urinate?"     No signs 9. EXPOSURE: "Have you traveled to a foreign country recently?" "Have you been exposed to anyone with diarrhea?" "Could you have eaten any food that was spoiled?"     No 10. ANTIBIOTIC USE: "Are you taking antibiotics now or have you taken antibiotics in the past 2 months?"       No 11. OTHER SYMPTOMS: "Do you have any other symptoms?" (e.g., fever, blood in stool)       No 12. PREGNANCY: "Is there any chance you are pregnant?" "When was your last menstrual period?"       n/a  Protocols used: DIARRHEA-A-AH

## 2018-03-30 NOTE — Patient Instructions (Signed)
Please increase your fluid intake. Hydration is key!  Get plenty of rest.   If you feel up to eating solid foods, try to follow the dietary guide at the bottom of your After Visit Summary. These foods are gentler on the stomach.  Start a daily probiotic. Some examples include Align, Digestive Advantage, Culturelle. The pharmacy downstairs carries some good OTC probiotic options.    If you are unable to keep fluids down by mouth, or if symptoms acutely worsen, please go to the ER as you may need IV fluids.     Food Choices to Help Relieve Diarrhea, Adult When you have diarrhea, the foods you eat and your eating habits are very important. Choosing the right foods and drinks can help relieve diarrhea. Also, because diarrhea can last up to 7 days, you need to replace lost fluids and electrolytes (such as sodium, potassium, and chloride) in order to help prevent dehydration.  WHAT GENERAL GUIDELINES DO I NEED TO FOLLOW?  Slowly drink 1 cup (8 oz) of fluid for each episode of diarrhea. If you are getting enough fluid, your urine will be clear or pale yellow.  Eat starchy foods. Some good choices include white rice, white toast, pasta, low-fiber cereal, baked potatoes (without the skin), saltine crackers, and bagels.  Avoid large servings of any cooked vegetables.  Limit fruit to two servings per day. A serving is  cup or 1 small piece.  Choose foods with less than 2 g of fiber per serving.  Limit fats to less than 8 tsp (38 g) per day.  Avoid fried foods.  Eat foods that have probiotics in them. Probiotics can be found in certain dairy products.  Avoid foods and beverages that may increase the speed at which food moves through the stomach and intestines (gastrointestinal tract). Things to avoid include:  High-fiber foods, such as dried fruit, raw fruits and vegetables, nuts, seeds, and whole grain foods.  Spicy foods and high-fat foods.  Foods and beverages sweetened with  high-fructose corn syrup, honey, or sugar alcohols such as xylitol, sorbitol, and mannitol. WHAT FOODS ARE RECOMMENDED? Grains White rice. White, French, or pita breads (fresh or toasted), including plain rolls, buns, or bagels. White pasta. Saltine, soda, or graham crackers. Pretzels. Low-fiber cereal. Cooked cereals made with water (such as cornmeal, farina, or cream cereals). Plain muffins. Matzo. Melba toast. Zwieback.  Vegetables Potatoes (without the skin). Strained tomato and vegetable juices. Most well-cooked and canned vegetables without seeds. Tender lettuce. Fruits Cooked or canned applesauce, apricots, cherries, fruit cocktail, grapefruit, peaches, pears, or plums. Fresh bananas, apples without skin, cherries, grapes, cantaloupe, grapefruit, peaches, oranges, or plums.  Meat and Other Protein Products Baked or boiled chicken. Eggs. Tofu. Fish. Seafood. Smooth peanut butter. Ground or well-cooked tender beef, ham, veal, lamb, pork, or poultry.  Dairy Plain yogurt, kefir, and unsweetened liquid yogurt. Lactose-free milk, buttermilk, or soy milk. Plain hard cheese. Beverages Sport drinks. Clear broths. Diluted fruit juices (except prune). Regular, caffeine-free sodas such as ginger ale. Water. Decaffeinated teas. Oral rehydration solutions. Sugar-free beverages not sweetened with sugar alcohols. Other Bouillon, broth, or soups made from recommended foods.  The items listed above may not be a complete list of recommended foods or beverages. Contact your dietitian for more options. WHAT FOODS ARE NOT RECOMMENDED? Grains Whole grain, whole wheat, bran, or rye breads, rolls, pastas, crackers, and cereals. Wild or brown rice. Cereals that contain more than 2 g of fiber per serving. Corn tortillas or taco shells. Cooked   or dry oatmeal. Granola. Popcorn. Vegetables Raw vegetables. Cabbage, broccoli, Brussels sprouts, artichokes, baked beans, beet greens, corn, kale, legumes, peas, sweet  potatoes, and yams. Potato skins. Cooked spinach and cabbage. Fruits Dried fruit, including raisins and dates. Raw fruits. Stewed or dried prunes. Fresh apples with skin, apricots, mangoes, pears, raspberries, and strawberries.  Meat and Other Protein Products Chunky peanut butter. Nuts and seeds. Beans and lentils. Bacon.  Dairy High-fat cheeses. Milk, chocolate milk, and beverages made with milk, such as milk shakes. Cream. Ice cream. Sweets and Desserts Sweet rolls, doughnuts, and sweet breads. Pancakes and waffles. Fats and Oils Butter. Cream sauces. Margarine. Salad oils. Plain salad dressings. Olives. Avocados.  Beverages Caffeinated beverages (such as coffee, tea, soda, or energy drinks). Alcoholic beverages. Fruit juices with pulp. Prune juice. Soft drinks sweetened with high-fructose corn syrup or sugar alcohols. Other Coconut. Hot sauce. Chili powder. Mayonnaise. Gravy. Cream-based or milk-based soups.  The items listed above may not be a complete list of foods and beverages to avoid. Contact your dietitian for more information. WHAT SHOULD I DO IF I BECOME DEHYDRATED? Diarrhea can sometimes lead to dehydration. Signs of dehydration include dark urine and dry mouth and skin. If you think you are dehydrated, you should rehydrate with an oral rehydration solution. These solutions can be purchased at pharmacies, retail stores, or online.  Drink -1 cup (120-240 mL) of oral rehydration solution each time you have an episode of diarrhea. If drinking this amount makes your diarrhea worse, try drinking smaller amounts more often. For example, drink 1-3 tsp (5-15 mL) every 5-10 minutes.  A general rule for staying hydrated is to drink 1-2 L of fluid per day. Talk to your health care provider about the specific amount you should be drinking each day. Drink enough fluids to keep your urine clear or pale yellow.   This information is not intended to replace advice given to you by your health  care provider. Make sure you discuss any questions you have with your health care provider.   Document Released: 04/23/2003 Document Revised: 02/21/2014 Document Reviewed: 12/24/2012 Elsevier Interactive Patient Education 2016 Elsevier Inc.   

## 2018-03-30 NOTE — Telephone Encounter (Signed)
See note

## 2018-03-31 LAB — COMPREHENSIVE METABOLIC PANEL
AG Ratio: 1.7 (calc) (ref 1.0–2.5)
ALT: 11 U/L (ref 6–29)
AST: 14 U/L (ref 10–35)
Albumin: 4.3 g/dL (ref 3.6–5.1)
Alkaline phosphatase (APISO): 47 U/L (ref 37–153)
BUN/Creatinine Ratio: 13 (calc) (ref 6–22)
BUN: 14 mg/dL (ref 7–25)
CO2: 27 mmol/L (ref 20–32)
Calcium: 9.3 mg/dL (ref 8.6–10.4)
Chloride: 98 mmol/L (ref 98–110)
Creat: 1.04 mg/dL — ABNORMAL HIGH (ref 0.60–0.93)
GLUCOSE: 117 mg/dL — AB (ref 65–99)
Globulin: 2.6 g/dL (calc) (ref 1.9–3.7)
Potassium: 3.8 mmol/L (ref 3.5–5.3)
Sodium: 137 mmol/L (ref 135–146)
Total Bilirubin: 0.5 mg/dL (ref 0.2–1.2)
Total Protein: 6.9 g/dL (ref 6.1–8.1)

## 2018-03-31 LAB — CBC WITH DIFFERENTIAL/PLATELET
Absolute Monocytes: 460 cells/uL (ref 200–950)
Basophils Absolute: 41 cells/uL (ref 0–200)
Basophils Relative: 0.7 %
Eosinophils Absolute: 18 cells/uL (ref 15–500)
Eosinophils Relative: 0.3 %
HEMATOCRIT: 40.7 % (ref 35.0–45.0)
Hemoglobin: 13.8 g/dL (ref 11.7–15.5)
Lymphs Abs: 1115 cells/uL (ref 850–3900)
MCH: 32.1 pg (ref 27.0–33.0)
MCHC: 33.9 g/dL (ref 32.0–36.0)
MCV: 94.7 fL (ref 80.0–100.0)
MPV: 9.6 fL (ref 7.5–12.5)
Monocytes Relative: 7.8 %
NEUTROS PCT: 72.3 %
Neutro Abs: 4266 cells/uL (ref 1500–7800)
Platelets: 222 10*3/uL (ref 140–400)
RBC: 4.3 10*6/uL (ref 3.80–5.10)
RDW: 11.9 % (ref 11.0–15.0)
Total Lymphocyte: 18.9 %
WBC: 5.9 10*3/uL (ref 3.8–10.8)

## 2018-04-10 ENCOUNTER — Ambulatory Visit: Payer: Medicare Other | Admitting: Family Medicine

## 2018-04-10 ENCOUNTER — Ambulatory Visit: Payer: Self-pay | Admitting: Family Medicine

## 2018-04-10 ENCOUNTER — Encounter: Payer: Self-pay | Admitting: Family Medicine

## 2018-04-10 VITALS — BP 114/68 | HR 71 | Temp 97.5°F | Ht 67.0 in | Wt 138.2 lb

## 2018-04-10 DIAGNOSIS — J392 Other diseases of pharynx: Secondary | ICD-10-CM | POA: Diagnosis not present

## 2018-04-10 DIAGNOSIS — F411 Generalized anxiety disorder: Secondary | ICD-10-CM | POA: Diagnosis not present

## 2018-04-10 NOTE — Telephone Encounter (Signed)
She called in c/o chemical in my throat, down into my chest and top of my mouth.    I started Prozac recently prescribed by my GYN doctor.    My thyroid medication was increased by the endocrinologist.   I immediately had this reaction on the 2nd day after starting the Prozac.  I have been talking with both doctors, GYN and endocrinologist that prescribed these drugs.  They want me to call my PCP.  During the night I get up and put ice on my throat.    It's like a burn.    I made her an appt with Jarold Motto, PA,RD,LDN for today at 3:40 at her request since Dr. Earlene Plater was booked. I sent these triage notes to the nurse pool at [facility].   Reason for Disposition . Caller has URGENT medication question about med that PCP prescribed and triager unable to answer question    Reaction to Prozac and Synthroid.  Answer Assessment - Initial Assessment Questions 1. SYMPTOMS: "Do you have any symptoms?"     See triage notes. 2. SEVERITY: If symptoms are present, ask "Are they mild, moderate or severe?"     Severe  Protocols used: MEDICATION QUESTION CALL-A-AH

## 2018-04-10 NOTE — Telephone Encounter (Signed)
See note

## 2018-04-10 NOTE — Patient Instructions (Signed)
It was very nice to see you today!  Stop the prozac  Take ativan 3 times daily as needed.  Talk to Dr Earlene Plater about starting another medication for anxiety.   Good options would be remeron or zoloft.   Take care, Dr Jimmey Ralph

## 2018-04-10 NOTE — Progress Notes (Signed)
Chief Complaint:  Ashley [name] is a 78 y.o. female who presents for same day appointment with a chief complaint of throat irritation.   Assessment/Plan:  Throat irritation No red flags.  It is not clear if this was an actual medication reaction given that she had been on the Prozac for approximately 3 weeks prior to developing symptoms.  Currently she does not have any signs of an allergic reaction or any other adverse reaction to medications.  Given that she has been off her medication for the past 3 days and symptoms seem to be improving, we will not restart at this time.  Recommended salt water gargles over the next few days.  Do not think she needs any further intervention at this point.  Discussed reasons to return to care and seek emergent care.    Anxiety Had lengthy discussion with patient regarding treatment options.  Discussed potential options including trial of an alternative SSRI versus Remeron versus SNRI.  Given that she has had possible adverse reaction to Celexa and Prozac in the past, she may benefit from a trial of non-SSRI medication.  Recommended that she follow-up with her PCP soon to discuss further options.  In the interim, recommended that she continue taking her Ativan as directed 3 times daily as needed.     Subjective:  HPI:  Throat Irritation, acute problem Symptoms started 3 days ago.  Patient thinks that it is due to a medication interaction.  She was started on Prozac by her OB/GYN 24 days ago.  She did well after she relaxed endocrinologist and had her dose of Synthroid increased to 88 mcg daily 4 days ago.  After being on the increased dose of Synthroid and on Prozac for 2 days she started noticing irritation to the back of her throat.  This described as a burning sensation.  She was concerned that she was having a medication side effect and subsequently called her OB/GYN who told her that was not the Prozac and that she should call her endocrinologist.  Her  endocrinologist told her it was not due to the Synthroid that she should follow-up with her PCP.  Patient has since stopped taking her Prozac.  She has had some improvement in her throat irritation symptoms since then.  She still has a little irritation to the area.  No wheezing.  No shortness of breath.  No difficulty speaking or swallowing.  She additionally went back to her previous dose of Synthroid.  No other obvious alleviating or aggravating factors.   Patient has quite a bit of underlying anxiety.  She also has quite a bit of difficulty sleeping.  She is not sure if the Prozac was helping significantly with her sleep.  She has tried several other medications in the past for anxiety however does not remember the names of them.  She thinks that she had had a similar reaction to Celexa and Zoloft in the past but does not remember the specific details.  ROS: Per HPI  PMH: She reports that she has never smoked. She has never used smokeless tobacco. She reports that she does not drink alcohol or use drugs.      Objective:  Physical Exam: BP 114/68 (BP Location: Left Arm, Patient Position: Sitting, Cuff Size: Normal)   Pulse 71   Temp (!) 97.5 F (36.4 C) (Oral)   Ht 5\' 7"  (1.702 m)   Wt 138 lb 3.2 oz (62.7 kg)   SpO2 96%   BMI 21.65 kg/m  Gen: NAD, resting comfortably HEENT: TMs clear.  OP slightly erythematous with no ulcerations, lesions, or other obvious abnormalities.  Nasal mucosa normal.  No significant lymphadenopathy or thyromegaly noted. CV: Regular rate and rhythm with no murmurs appreciated Pulm: Normal work of breathing, clear to auscultation bilaterally with no crackles, wheezes, or rhonchi       M. Jimmey Ralph, MD 04/10/2018 3:50 PM

## 2018-04-12 ENCOUNTER — Other Ambulatory Visit: Payer: Self-pay | Admitting: Family Medicine

## 2018-04-12 DIAGNOSIS — F419 Anxiety disorder, unspecified: Secondary | ICD-10-CM

## 2018-04-12 DIAGNOSIS — E039 Hypothyroidism, unspecified: Secondary | ICD-10-CM

## 2018-04-12 NOTE — Telephone Encounter (Signed)
Last o/v 03-26-18 Last script 12/29/2017 #90 0 rf  No f/u app

## 2018-04-13 NOTE — Telephone Encounter (Signed)
Last OV 03/26/2018 Last refill 10/25/2017 Next OV not scheduled

## 2018-05-03 ENCOUNTER — Other Ambulatory Visit: Payer: Self-pay

## 2018-05-03 MED ORDER — AMLODIPINE BESYLATE 5 MG PO TABS: 5.0000 mg | ORAL_TABLET | Freq: Every day | ORAL | 3 refills | Status: DC

## 2018-05-10 ENCOUNTER — Encounter: Payer: Self-pay | Admitting: Family Medicine

## 2018-05-10 DIAGNOSIS — E049 Nontoxic goiter, unspecified: Secondary | ICD-10-CM | POA: Insufficient documentation

## 2018-06-29 ENCOUNTER — Other Ambulatory Visit: Payer: Self-pay

## 2018-06-29 ENCOUNTER — Other Ambulatory Visit (INDEPENDENT_AMBULATORY_CARE_PROVIDER_SITE_OTHER): Payer: Medicare Other

## 2018-06-29 DIAGNOSIS — E063 Autoimmune thyroiditis: Secondary | ICD-10-CM

## 2018-06-29 DIAGNOSIS — E038 Other specified hypothyroidism: Secondary | ICD-10-CM

## 2018-06-29 LAB — T4, FREE: Free T4: 1.25 ng/dL (ref 0.60–1.60)

## 2018-06-29 LAB — TSH: TSH: 1 u[IU]/mL (ref 0.35–4.50)

## 2018-07-31 ENCOUNTER — Encounter: Payer: Self-pay | Admitting: Family Medicine

## 2018-08-01 ENCOUNTER — Ambulatory Visit (INDEPENDENT_AMBULATORY_CARE_PROVIDER_SITE_OTHER): Payer: Medicare Other | Admitting: Physician Assistant

## 2018-08-01 ENCOUNTER — Encounter: Payer: Self-pay | Admitting: Physician Assistant

## 2018-08-01 DIAGNOSIS — K1370 Unspecified lesions of oral mucosa: Secondary | ICD-10-CM

## 2018-08-01 DIAGNOSIS — N898 Other specified noninflammatory disorders of vagina: Secondary | ICD-10-CM

## 2018-08-01 DIAGNOSIS — R300 Dysuria: Secondary | ICD-10-CM

## 2018-08-01 NOTE — Progress Notes (Addendum)
TELEPHONE ENCOUNTER   Patient verbally agreed to telephone visit and is aware that copayment and coinsurance may apply. Patient was treated using telemedicine according to accepted telemedicine protocols.  Location of the patient: home Location of provider: [place] office Names of all persons participating in the telemedicine service and role in the encounter: Inda Coke, [place], Maree Erie LPN, Ashley [name] (patient)  Subjective:   Chief Complaint  Patient presents with  . Thrush  . Vaginal Itching     HPI   Thrush Pt's tongue was white with spots and sore, she started eating yogurt per her GYN and it is feeling a better. She also did some salt water rinses.  Vaginal itching Pt c/o vaginal itching off and on externally since March. She is using Nystatin cream at night only, once every 3 days. Vulva area is red, denies vaginal discharge. She occasionally uses a hormone cream, but hasn't recently. She states that her ob-gyn had recommended she take a diflucan tablet x 2 and then follow-up in the office. She is very reluctant to come in to her ob-gyn office due to COVID-19. She is also very reluctant to take diflucan because her daughter had a "really bad reaction" to this when she took it.   Patient Active Problem List   Diagnosis Date Noted  . Goiter   . Dyspepsia 03/26/2018  . Psychophysiological insomnia 03/26/2018  . Hashimoto's thyroiditis 09/14/2017  . IFG (impaired fasting glucose) 06/16/2016  . Essential hypertension 06/17/2015  . H/O mixed hyperlipidemia 06/17/2015  . Dysthymia 06/17/2015  . Vitamin D deficiency 06/17/2014  . Osteopenia 08/19/2013  . Diverticulosis of large intestine 05/04/2007  . Hypothyroidism 05/03/2007  . Anxiety state 05/03/2007  . Hemorrhoids 05/03/2007   Social History   Tobacco Use  . Smoking status: Never Smoker  . Smokeless tobacco: Never Used  Substance Use Topics  . Alcohol use: No    Current Outpatient  Medications:  .  amLODipine (NORVASC) 5 MG tablet, Take 1 tablet (5 mg total) by mouth daily., Disp: 90 tablet, Rfl: 3 .  aspirin 81 MG tablet, Take 81 mg by mouth at bedtime. , Disp: , Rfl:  .  Calcium Carb-Cholecalciferol (CALTRATE 600+D) 600-800 MG-UNIT TABS, Take 1 tablet by mouth daily., Disp: , Rfl:  .  LORazepam (ATIVAN) 1 MG tablet, TAKE 1/2 UP TO 1&1/2 TABLETS BY MOUTH THREE TIMES DAILY AS NEEDED FOR ANXIETY, Disp: 135 tablet, Rfl: 0 .  nystatin-triamcinolone ointment (MYCOLOG), APPLY AA BID UTD, Disp: , Rfl:  .  omeprazole (PRILOSEC) 20 MG capsule, Take 1 capsule (20 mg total) by mouth daily. (Patient taking differently: Take 20 mg by mouth daily as needed (for heartburn symptoms). ), Disp: 30 capsule, Rfl: 3 .  OVER THE COUNTER MEDICATION, Take 1 capsule by mouth See admin instructions. (Methyl-B Complete) Methyl Folate + with L-5-MTHF+Methyl B12 capsule = Take 1 capsule by mouth once a day, Disp: , Rfl:  .  promethazine (PHENERGAN) 12.5 MG tablet, Take 1 tablet (12.5 mg total) by mouth every 6 (six) hours as needed for nausea or vomiting., Disp: 30 tablet, Rfl: 0 .  SYNTHROID 88 MCG tablet, TK 1 T PO QD IN THE MORNING OES, Disp: , Rfl:  Allergies  Allergen Reactions  . Alendronate Sodium Other (See Comments)    FOSAMAX: Patient states "blisters in my mouth"  . Penicillins Other (See Comments)    Caused fatigue/malaise, "feels extremely bad" Did PCN reaction causing immediate rash, facial/tongue/throat swelling, SOB or lightheadedness  with hypotension: No Has patient had a PCN reaction causing severe rash involving mucus membranes or skin necrosis: No Has patient had a PCN reaction that required hospitalization: No Has patient had a PCN reaction occurring within the last 10 years: Yes If all of the above answers are "NO", then may proceed with Cephalosporin use. Extremely lethargic  . Raloxifene Other (See Comments)    EVISTA: Patient states "it dried me out"  . Risedronate Sodium  Other (See Comments)    ACTONEL: Patient states "teeth problems" (??)  . Shellfish-Derived Products Itching    Throat started to itch  . Citalopram Anxiety  . Latex Rash    Assessment & Plan:   1. Vaginal itching ; Dysuria  2. Mouth problem    She is coming into the office tomorrow (per her request) for me to evaluate both of these in the office. Will perform cervicovaginal swab, oral exam and UA/culture to further examine.  Orders Placed This Encounter  Procedures  . Urine Culture  . POCT urinalysis dipstick   No orders of the defined types were placed in this encounter.   Jarold MottoSamantha Ilian Wessell, [place]PA 08/01/2018  Time spent with the patient: 13 minutes, spent in obtaining information about her symptoms, reviewing her previous labs, evaluations, and treatments, counseling her about her condition (please see the discussed topics above), and developing a plan to further investigate it; she had a number of questions which I addressed.

## 2018-08-02 ENCOUNTER — Other Ambulatory Visit (HOSPITAL_COMMUNITY)
Admission: RE | Admit: 2018-08-02 | Discharge: 2018-08-02 | Disposition: A | Discharge status: Home / Self Care | Payer: Medicare Other | Source: Ambulatory Visit | Attending: Physician Assistant | Admitting: Physician Assistant

## 2018-08-02 ENCOUNTER — Telehealth: Payer: Self-pay | Admitting: Physical Therapy

## 2018-08-02 ENCOUNTER — Other Ambulatory Visit: Payer: Self-pay | Admitting: *Deleted

## 2018-08-02 DIAGNOSIS — N898 Other specified noninflammatory disorders of vagina: Secondary | ICD-10-CM

## 2018-08-02 DIAGNOSIS — R300 Dysuria: Secondary | ICD-10-CM

## 2018-08-02 DIAGNOSIS — K1370 Unspecified lesions of oral mucosa: Secondary | ICD-10-CM

## 2018-08-02 LAB — POCT URINALYSIS DIPSTICK
Bilirubin, UA: NEGATIVE
Blood, UA: NEGATIVE
Glucose, UA: NEGATIVE
Ketones, UA: NEGATIVE
Leukocytes, UA: NEGATIVE
Nitrite, UA: POSITIVE
Protein, UA: NEGATIVE
Spec Grav, UA: 1.02 (ref 1.010–1.025)
Urobilinogen, UA: 0.2 E.U./dL
pH, UA: 5 (ref 5.0–8.0)

## 2018-08-02 MED ORDER — NITROFURANTOIN MONOHYD MACRO 100 MG PO CAPS: 100.0000 mg | ORAL_CAPSULE | Freq: Two times a day (BID) | ORAL | 0 refills | Status: DC

## 2018-08-02 NOTE — Telephone Encounter (Signed)
Copied from No Name [id number]. Topic: General - Inquiry >> Aug 02, 2018 10:53 AM Mathis Bud wrote: Reason for CRM: Patient called requesting a nurse to call back to go over UA results.  AESL:[phone] Cell: [phone]

## 2018-08-02 NOTE — Progress Notes (Addendum)
Patient came into the office today, 08/02/18, for UA, vaginal and oral exam.  Physical Exam Exam conducted with a chaperone present.  Constitutional:      Appearance: She is well-developed.  HENT:     Head: Normocephalic and atraumatic.     Mouth/Throat:     Lips: Pink.     Comments: No lesions or abnormal discoloration Eyes:     Conjunctiva/sclera: Conjunctivae normal.  Neck:     Musculoskeletal: Normal range of motion and neck supple.  Pulmonary:     Effort: Pulmonary effort is normal.  Genitourinary:    Pubic Area: No rash.      Labia:        Right: Rash present.        Left: Rash present.      Vagina: Vaginal discharge (clear/white) present.     Cervix: No cervical motion tenderness.     Uterus: Normal.   Musculoskeletal: Normal range of motion.  Skin:    General: Skin is warm and dry.  Neurological:     Mental Status: She is alert and oriented to person, place, and time.  Psychiatric:        Behavior: Behavior normal.        Thought Content: Thought content normal.        Judgment: Judgment normal.    Results for orders placed or performed in visit on 08/01/18  POCT urinalysis dipstick  Result Value Ref Range   Color, UA yellow    Clarity, UA CLear    Glucose, UA Negative Negative   Bilirubin, UA Negative    Ketones, UA Negative    Spec Grav, UA 1.020 1.010 - 1.025   Blood, UA Negative    pH, UA 5.0 5.0 - 8.0   Protein, UA Negative Negative   Urobilinogen, UA 0.2 0.2 or 1.0 E.U./dL   Nitrite, UA Positive    Leukocytes, UA Negative Negative   Appearance     Odor       Plan: Erythematous vulva. Recommended use of desitin cream for barrier. She also admitted during visit to using "antiseptic" cleaner for her vaginal area. Recommended that she use plain, mild soap and water. Further intervention based on vaginal swab.  Urine with nitrites, suspect early acute cystitis - will treat with macrobid per orders.  Inda Coke PA-C 08/02/18

## 2018-08-02 NOTE — Addendum Note (Signed)
Addended by: Francis Dowse T on: 08/02/2018 09:26 AM   Modules accepted: Orders

## 2018-08-02 NOTE — Telephone Encounter (Signed)
Called pt and asked if she called again after I talked to her? She said no. Told her okay got message and wanted to make sure she didn't need anything else. Pt said no.

## 2018-08-02 NOTE — Patient Instructions (Signed)
Make an appointment with Dr. Juleen China to discuss your concerns about your thyroid and cholesterol.  Buy desitin or similar zinc-oxide cream to put on the outside of your vagina to help with healing.   Vaginal Hygiene Healthy practices for vaginal hygiene - Avoid pajamas. A robe / nightgown allows better air circulation. Sleep without panties / panties whenever possible. - Wear cotton panties / panties during the day. After washing the panties / panties, rinse them twice to avoid irritating residues. Do not use fabric softeners for panties and / or swimsuits. - Avoid tights, leotard, leggings, tight pants or other tight attire. Loose skirts and pants allow air to circulate. - Avoid the pantiprotector. Better use tampons or sanitary pads. It is beneficial to bathe with warm water every day: - Use mild soap and water. Avoid soaps with fragrance. - Use soap to wash the other regions apart from the genital area before leaving the tub. Limit the use of soap in the genital areas. Use soaps without fragrance. - Rinse the genital area and pat dry. A hair dryer can only be used if cold air is used. - Do not use bubble bath or fragrant soap. - Do not use vaginal sprays or talcum powder. These contain chemicals that irritate the skin. - If the genital area is sore or swollen, fragrance-free disposable wipes can be used instead of toilet paper. - Emollients, such as Vaseline may help protect the skin and may be applied to the irritated area. - Always remember to clean yourself from front to back after bowel movements. Pat dry after urinating. - Don't keep the wet swimsuit on for a long time after swimming.

## 2018-08-02 NOTE — Addendum Note (Signed)
Addended by: Francis Dowse T on: 08/02/2018 09:07 AM   Modules accepted: Orders

## 2018-08-03 ENCOUNTER — Other Ambulatory Visit: Payer: Self-pay | Admitting: *Deleted

## 2018-08-03 LAB — CERVICOVAGINAL ANCILLARY ONLY
Bacterial vaginitis: NEGATIVE
Candida vaginitis: POSITIVE — AB

## 2018-08-03 MED ORDER — FLUCONAZOLE 150 MG PO TABS: ORAL_TABLET | ORAL | 0 refills | Status: DC

## 2018-08-03 NOTE — Progress Notes (Addendum)
Error

## 2018-08-04 LAB — URINE CULTURE
MICRO NUMBER:: 584025
SPECIMEN QUALITY:: ADEQUATE

## 2018-08-06 ENCOUNTER — Other Ambulatory Visit: Payer: Self-pay | Admitting: Family Medicine

## 2018-08-06 DIAGNOSIS — F419 Anxiety disorder, unspecified: Secondary | ICD-10-CM

## 2018-08-06 NOTE — Telephone Encounter (Signed)
Requesting refill, Last OV 03/26/18, Last refill 04/12/18, #135 tablets, 0 refill.

## 2018-08-07 ENCOUNTER — Ambulatory Visit: Payer: Self-pay | Admitting: Family Medicine

## 2018-08-07 NOTE — Telephone Encounter (Signed)
Patient needs to be seen by a provider.

## 2018-08-07 NOTE — Telephone Encounter (Signed)
Please advise 

## 2018-08-07 NOTE — Telephone Encounter (Signed)
Please schedule patient for appt @ either at [place] or [place].  I believe we are full tomorrow.    Thank you !

## 2018-08-07 NOTE — Telephone Encounter (Signed)
Pt reports mild, brief episodes of SOB upon first awakening in mornings, 3 episodes, onset Sunday.. States onset when she started Macrobid, has taken 4 doses.  States she read the insert and this is one of the reactions. States only in the morning, resolves quickly, "I don't feel like I'm in distress."  Denies any other symptoms. States H/O anxiety "And sometimes I get SOB with that or my Hashimotos disease." Pt inquiring if there is any other antibiotic she could take or stay on Macrobid. States "The medical book I'm reading says the SOB from [place] could be long term if not caught early." Made aware she may need appt, for further eval, states she would like a CB. States she will hold off on taking dose of Macrobid this AM.   Please advise: [phone]   Reason for Disposition . [1] MILD longstanding difficulty breathing AND [2]  SAME as normal  Answer Assessment - Initial Assessment Questions 1. RESPIRATORY STATUS: "Describe your breathing?" (e.g., wheezing, shortness of breath, unable to speak, severe coughing)     Mild only when first wakening in mornings 2. ONSET: "When did this breathing problem begin?"      After taking Macrobid 3. PATTERN "Does the difficult breathing come and go, or has it been constant since it started?"      Intermittent 4. SEVERITY: "How bad is your breathing?" (e.g., mild, moderate, severe)    - MILD: No SOB at rest, mild SOB with walking, speaks normally in sentences, can lay down, no retractions, pulse < 100.    - MODERATE: SOB at rest, SOB with minimal exertion and prefers to sit, cannot lie down flat, speaks in phrases, mild retractions, audible wheezing, pulse 100-120.    - SEVERE: Very SOB at rest, speaks in single words, struggling to breathe, sitting hunched forward, retractions, pulse > 120      Mild, brief 5. RECURRENT SYMPTOM: "Have you had difficulty breathing before?" If so, ask: "When was the last time?" and "What happened that time?"      At times  with Hashimotos disease 6. CARDIAC HISTORY: "Do you have any history of heart disease?" (e.g., heart attack, angina, bypass surgery, angioplasty)      HTN 7. LUNG HISTORY: "Do you have any history of lung disease?"  (e.g., pulmonary embolus, asthma, emphysema)     no 8. CAUSE: "What do you think is causing the breathing problem?"     Macrobid 9. OTHER SYMPTOMS: "Do you have any other symptoms? (e.g., dizziness, runny nose, cough, chest pain, fever)     One cough, one time, dry 11. TRAVEL: "Have you traveled out of the country in the last month?" (e.g., travel history, exposures)       no  Protocols used: BREATHING DIFFICULTY-A-AH

## 2018-08-08 ENCOUNTER — Ambulatory Visit (INDEPENDENT_AMBULATORY_CARE_PROVIDER_SITE_OTHER): Payer: Medicare Other | Admitting: Family Medicine

## 2018-08-08 ENCOUNTER — Other Ambulatory Visit: Payer: Self-pay

## 2018-08-08 ENCOUNTER — Encounter: Payer: Self-pay | Admitting: Family Medicine

## 2018-08-08 DIAGNOSIS — R0602 Shortness of breath: Secondary | ICD-10-CM

## 2018-08-08 DIAGNOSIS — N390 Urinary tract infection, site not specified: Secondary | ICD-10-CM | POA: Diagnosis not present

## 2018-08-08 NOTE — Progress Notes (Signed)
Subjective:    Patient ID: Ashley [name], female    DOB: July 29, 1940, 78 y.o.   MRN: [id number]  HPI Virtual Visit via Video Note  I connected with the patient on 08/08/18 at 10:30 AM EDT by a video enabled telemedicine application and verified that I am speaking with the correct person using two identifiers.  Location patient: home Location provider:work or home office Persons participating in the virtual visit: patient, provider  I discussed the limitations of evaluation and management by telemedicine and the availability of in person appointments. The patient expressed understanding and agreed to proceed.   HPI: Here to ask about stopping a medication. She was seen in the clinic on 08-01-18 for a routine exam and a UA was positive for nitrites. She had no UTI symptoms at the time. A culture grew Klebsiella, so she was started on Macrobid. She has taken 4 days of this and still has no urinary symptoms. However for the past 2 days she has had mild SOB, and she thinks this is a side effect. No cough or chest pain or fever. No ankle swelling.    ROS: See pertinent positives and negatives per HPI.  Past Medical History:  Diagnosis Date  . Anxiety state, unspecified   . Diverticulosis of colon (without mention of hemorrhage)   . Osteoporosis, unspecified   . Other chest pain   . Other malaise and fatigue   . Personal history of other malignant neoplasm of skin   . Unspecified hemorrhoids without mention of complication   . Unspecified hypothyroidism     Past Surgical History:  Procedure Laterality Date  . COLONOSCOPY      Family History  Problem Relation Age of Onset  . Ovarian cancer Mother   . Pancreatic cancer Brother   . Diabetes Other   . Prostate cancer Brother      Current Outpatient Medications:  .  amLODipine (NORVASC) 5 MG tablet, Take 1 tablet (5 mg total) by mouth daily., Disp: 90 tablet, Rfl: 3 .  aspirin 81 MG tablet, Take 81 mg by mouth at bedtime. , Disp:  , Rfl:  .  Calcium Carb-Cholecalciferol (CALTRATE 600+D) 600-800 MG-UNIT TABS, Take 1 tablet by mouth daily., Disp: , Rfl:  .  LORazepam (ATIVAN) 1 MG tablet, TAKE 0.5 TO 1.5 TABLETS BY MOUTH THREE TIMES DAILY AS NEEDED FOR ANXIETY, Disp: 135 tablet, Rfl: 1 .  nitrofurantoin, macrocrystal-monohydrate, (MACROBID) 100 MG capsule, Take 1 capsule (100 mg total) by mouth 2 (two) times daily., Disp: 10 capsule, Rfl: 0 .  nystatin-triamcinolone ointment (MYCOLOG), APPLY AA BID UTD, Disp: , Rfl:  .  omeprazole (PRILOSEC) 20 MG capsule, Take 1 capsule (20 mg total) by mouth daily. (Patient taking differently: Take 20 mg by mouth daily as needed (for heartburn symptoms). ), Disp: 30 capsule, Rfl: 3 .  OVER THE COUNTER MEDICATION, Take 1 capsule by mouth See admin instructions. (Methyl-B Complete) Methyl Folate + with L-5-MTHF+Methyl B12 capsule = Take 1 capsule by mouth once a day, Disp: , Rfl:  .  promethazine (PHENERGAN) 12.5 MG tablet, Take 1 tablet (12.5 mg total) by mouth every 6 (six) hours as needed for nausea or vomiting., Disp: 30 tablet, Rfl: 0 .  SYNTHROID 88 MCG tablet, TK 1 T PO QD IN THE MORNING OES, Disp: , Rfl:   EXAM:  VITALS per patient if applicable:  GENERAL: alert, oriented, appears well and in no acute distress  HEENT: atraumatic, conjunttiva clear, no obvious abnormalities on inspection of  external nose and ears  NECK: normal movements of the head and neck  LUNGS: on inspection no signs of respiratory distress, breathing rate appears normal, no obvious gross SOB, gasping or wheezing  CV: no obvious cyanosis  MS: moves all visible extremities without noticeable abnormality  PSYCH/NEURO: pleasant and cooperative, no obvious depression or anxiety, speech and thought processing grossly intact  ASSESSMENT AND PLAN: She has some SOB that she thinks is a side effect of the Macrobid, and she has already had what should be adequate treatment for the UTI. I advised her to stop the  Macrobid and she agreed. Hopefull the SOB will resolve, but if not she will follow up. Gershon CraneStephen Leslieann Whisman, MD  Discussed the following assessment and plan:  No diagnosis found.     I discussed the assessment and treatment plan with the patient. The patient was provided an opportunity to ask questions and all were answered. The patient agreed with the plan and demonstrated an understanding of the instructions.   The patient was advised to call back or seek an in-person evaluation if the symptoms worsen or if the condition fails to improve as anticipated.     Review of Systems     Objective:   Physical Exam        Assessment & Plan:

## 2018-10-29 ENCOUNTER — Telehealth: Payer: Self-pay | Admitting: Family Medicine

## 2018-10-29 NOTE — Telephone Encounter (Signed)
SYNTHROID 88 MCG tablet  pls resend this to [place], [place] [phone] (Phone) [phone] (Fax)   Pt is saying that she used to take 75 mg Synthroid and the 75 is what has been sent as a refill to [facility]. We are showing the Synthroid 88 so may be a bit of confusion. Please resend the 88 MCG . Pt states that if there is a problem you may call the dr. # of [place] at [phone].  She is very uptight for she is almost out of this med. She is requesting this ASAP. If there is confusiln you may reach bachk out to her.

## 2018-11-05 ENCOUNTER — Telehealth: Payer: Self-pay | Admitting: Physical Therapy

## 2018-11-05 ENCOUNTER — Other Ambulatory Visit: Payer: Self-pay

## 2018-11-05 MED ORDER — SYNTHROID 88 MCG PO TABS: ORAL_TABLET | ORAL | 1 refills | Status: DC

## 2018-11-05 NOTE — Telephone Encounter (Signed)
Rx sent in today. 

## 2018-11-05 NOTE — Telephone Encounter (Signed)
After discussing with Dr. Juleen China, I called pt to inform her that Dr. Juleen China recommends her getting the high dose this year.  Pt verbalized understanding.

## 2018-11-05 NOTE — Telephone Encounter (Signed)
Not sure if this message from a week ago ever came to [facility].  Please advise regarding Synthroid 51mcg.

## 2018-11-05 NOTE — Telephone Encounter (Signed)
Copied from [place] [id number]. Topic: General - Other >> Nov 05, 2018  8:48 AM Sheran Luz wrote: Patient would like to know if she should have high dose flu shot or regular flu shot this year. Please advise.

## 2018-11-05 NOTE — Telephone Encounter (Signed)
Patient calling back about this request.   Medication: SYNTHROID 88 MCG tablet    Pharmacy:  [place], [place] [facility] [phone] (Phone) [phone] (Fax)

## 2018-11-12 ENCOUNTER — Other Ambulatory Visit: Payer: Self-pay

## 2018-11-12 ENCOUNTER — Telehealth: Payer: Self-pay | Admitting: Physical Therapy

## 2018-11-12 MED ORDER — SYNTHROID 88 MCG PO TABS: ORAL_TABLET | ORAL | 0 refills | Status: DC

## 2018-11-12 MED ORDER — SYNTHROID 88 MCG PO TABS: ORAL_TABLET | ORAL | 3 refills | Status: DC

## 2018-11-12 NOTE — Telephone Encounter (Signed)
Copied from [place] [id number]. Topic: Quick Communication - Rx Refill/Question >> Nov 12, 2018  9:49 AM Carolyn Stare wrote: Medication SYNTHROID 88 MCG tablet   pt req a temp supply of about 10 pills  since her mail order sent her the wrong dosage and she had to send it back  Preferred Pharmacy [facility] [address]   Agent: Please be advised that RX refills may take up to 3 business days. We ask that you follow-up with your pharmacy.

## 2018-11-12 NOTE — Telephone Encounter (Signed)
Medication SYNTHROID 88 MCG tablet   pt req a temp supply of about 10 pills  since her mail order sent her the wrong dosage and she had to send it back

## 2018-11-12 NOTE — Telephone Encounter (Signed)
Rx sent to [facility] today.

## 2018-11-14 ENCOUNTER — Encounter: Payer: Self-pay | Admitting: Family Medicine

## 2018-12-03 ENCOUNTER — Telehealth: Payer: Self-pay

## 2018-12-03 NOTE — Telephone Encounter (Signed)
Spoke to patient (had to call her regarding her husband Robert's appt for 10/30 with Dr. Rogers Blocker.  She states that she has had burning with urination that started over this past weekend (10/17)  She denies fever, chills, dysuria, pelvic pressure, LBP, flank pain or frequency.  Also reports c/o burning sensation with bowel movements that started at the same time.  States that she noticed BRB per rectum after wiping.  Denies pain w/BM's but admits to constipation.  Reports that she has been straining some with BM's and is concerned about poss hemorrhoid.  Advised that we would need to see her for in office visit to evaluate.    Patient concerned about coming in because she states that she was told by provider at last OV for same symptoms that there was only 1 specific antibiotic that could be prescribed for her.  She then said that she wasn't sure that she wanted to keep appt.  Spent >20 minutes on phone with patient going back and forth about symptoms and her decision to keep her appointment.  States "I want the 'old school' antibiotics" if I need them.  I advised that this is something that she would need to discuss with provider.  She verbalized understanding.  Routing message to provider Inda Coke, [place]) for review prior to appointment.

## 2018-12-04 ENCOUNTER — Encounter: Payer: Self-pay | Admitting: Physician Assistant

## 2018-12-04 ENCOUNTER — Encounter: Payer: Self-pay | Admitting: Gastroenterology

## 2018-12-04 ENCOUNTER — Ambulatory Visit (INDEPENDENT_AMBULATORY_CARE_PROVIDER_SITE_OTHER): Payer: Medicare Other | Admitting: Physician Assistant

## 2018-12-04 ENCOUNTER — Other Ambulatory Visit: Payer: Self-pay

## 2018-12-04 VITALS — BP 120/82 | HR 73 | Temp 98.1°F | Ht 67.0 in | Wt 135.5 lb

## 2018-12-04 DIAGNOSIS — R300 Dysuria: Secondary | ICD-10-CM | POA: Diagnosis not present

## 2018-12-04 DIAGNOSIS — K625 Hemorrhage of anus and rectum: Secondary | ICD-10-CM | POA: Diagnosis not present

## 2018-12-04 LAB — POCT URINALYSIS DIPSTICK
Bilirubin, UA: NEGATIVE
Blood, UA: POSITIVE
Glucose, UA: NEGATIVE
Ketones, UA: NEGATIVE
Nitrite, UA: NEGATIVE
Protein, UA: POSITIVE — AB
Spec Grav, UA: 1.02 (ref 1.010–1.025)
Urobilinogen, UA: 0.2 E.U./dL
pH, UA: 6 (ref 5.0–8.0)

## 2018-12-04 MED ORDER — LIDOCAINE HCL 2 % EX GEL: CUTANEOUS | 0 refills | Status: DC

## 2018-12-04 MED ORDER — HYDROCORTISONE ACETATE 25 MG RE SUPP: 25.0000 mg | Freq: Two times a day (BID) | RECTAL | 0 refills | Status: DC

## 2018-12-04 MED ORDER — CIPROFLOXACIN HCL 250 MG PO TABS: 250.0000 mg | ORAL_TABLET | Freq: Two times a day (BID) | ORAL | 0 refills | Status: DC

## 2018-12-04 NOTE — Progress Notes (Signed)
Ashley [name] is a 78 y.o. female here for a new problem.  I acted as a Neurosurgeon for [employer], PA-C Corky Mull, LPN  History of Present Illness:   Chief Complaint  Patient presents with  . Urinary Urgency  . Dysuria  . Rectal Bleeding    HPI   Urinary symptoms Pt c/o urgency since Friday off and on and now is having dysuria. Denies back pain, fever, chills, nausea, vaginal discharge, hematuria. She has not taken anything for her symptoms. She is trying to push fluids.  Rectal bleeding Pt was recently constipated and had rectal bleeding over the weekend. She states that the bleeding was minimal, just when she wiped. Has a "sharp pain" at her rectum when trying to have a bowel movement. Takes a stool softener "most days" but recently started a laxative, had bleeding after the laxative. Pt applied vasoline and also desitin to apply to the area without significant relief. She has been spending more time sitting than usual. Tries to eat fiber-rich foods and also drink plenty of water. Last colonoscopy was in 2013 and has a 10-year recall for this. Denies: lightheadedness, dizziness, profuse bleeding, severe abdominal pain, unintentional weight loss   Past Medical History:  Diagnosis Date  . Anxiety state, unspecified   . Diverticulosis of colon (without mention of hemorrhage)   . Osteoporosis, unspecified   . Other chest pain   . Other malaise and fatigue   . Personal history of other malignant neoplasm of skin   . Unspecified hemorrhoids without mention of complication   . Unspecified hypothyroidism      Social History   Socioeconomic History  . Marital status: Married    Spouse name: Molly Maduro  . Number of children: 2  . Years of education: Not on file  . Highest education level: Not on file  Occupational History  . Occupation: Retired  Engineer, production  . Financial resource strain: Not on file  . Food insecurity    Worry: Not on file    Inability: Not on file  .  Transportation needs    Medical: Not on file    Non-medical: Not on file  Tobacco Use  . Smoking status: Never Smoker  . Smokeless tobacco: Never Used  Substance and Sexual Activity  . Alcohol use: No  . Drug use: No  . Sexual activity: Not on file  Lifestyle  . Physical activity    Days per week: Not on file    Minutes per session: Not on file  . Stress: Not on file  Relationships  . Social Musician on phone: Not on file    Gets together: Not on file    Attends religious service: Not on file    Active member of club or organization: Not on file    Attends meetings of clubs or organizations: Not on file    Relationship status: Not on file  . Intimate partner violence    Fear of current or ex partner: Not on file    Emotionally abused: Not on file    Physically abused: Not on file    Forced sexual activity: Not on file  Other Topics Concern  . Not on file  Social History Narrative  . Not on file    Past Surgical History:  Procedure Laterality Date  . COLONOSCOPY      Family History  Problem Relation Age of Onset  . Ovarian cancer Mother   . Pancreatic cancer Brother   .  Diabetes Other   . Prostate cancer Brother     Allergies  Allergen Reactions  . Macrobid [Nitrofurantoin] Shortness Of Breath  . Alendronate Sodium Other (See Comments)    FOSAMAX: Patient states "blisters in my mouth"  . Penicillins Other (See Comments)    Caused fatigue/malaise, "feels extremely bad" Did PCN reaction causing immediate rash, facial/tongue/throat swelling, SOB or lightheadedness with hypotension: No Has patient had a PCN reaction causing severe rash involving mucus membranes or skin necrosis: No Has patient had a PCN reaction that required hospitalization: No Has patient had a PCN reaction occurring within the last 10 years: Yes If all of the above answers are "NO", then may proceed with Cephalosporin use. Extremely lethargic  . Raloxifene Other (See Comments)     EVISTA: Patient states "it dried me out"  . Risedronate Sodium Other (See Comments)    ACTONEL: Patient states "teeth problems" (??)  . Shellfish-Derived Products Itching    Throat started to itch  . Citalopram Anxiety  . Latex Rash    Current Medications:   Current Outpatient Medications:  .  amLODipine (NORVASC) 5 MG tablet, Take 1 tablet (5 mg total) by mouth daily., Disp: 90 tablet, Rfl: 3 .  aspirin 81 MG tablet, Take 81 mg by mouth at bedtime. , Disp: , Rfl:  .  Calcium Carb-Cholecalciferol (CALTRATE 600+D) 600-800 MG-UNIT TABS, Take 1 tablet by mouth daily., Disp: , Rfl:  .  LORazepam (ATIVAN) 1 MG tablet, TAKE 0.5 TO 1.5 TABLETS BY MOUTH THREE TIMES DAILY AS NEEDED FOR ANXIETY, Disp: 135 tablet, Rfl: 1 .  nystatin-triamcinolone ointment (MYCOLOG), APPLY AA BID UTD, Disp: , Rfl:  .  omeprazole (PRILOSEC) 20 MG capsule, Take 1 capsule (20 mg total) by mouth daily. (Patient taking differently: Take 20 mg by mouth daily as needed (for heartburn symptoms). ), Disp: 30 capsule, Rfl: 3 .  OVER THE COUNTER MEDICATION, Take 1 capsule by mouth See admin instructions. (Methyl-B Complete) Methyl Folate + with L-5-MTHF+Methyl B12 capsule = Take 1 capsule by mouth once a day, Disp: , Rfl:  .  PRESCRIPTION MEDICATION, Place 1 application vaginally 2 (two) times a week. Estradiol Cream 0.2mg /ml, Disp: , Rfl:  .  SYNTHROID 88 MCG tablet, TK 1 T PO QD IN THE MORNING OES, Disp: 90 tablet, Rfl: 3 .  ciprofloxacin (CIPRO) 250 MG tablet, Take 1 tablet (250 mg total) by mouth 2 (two) times daily., Disp: 6 tablet, Rfl: 0 .  hydrocortisone (ANUSOL-HC) 25 MG suppository, Place 1 suppository (25 mg total) rectally 2 (two) times daily., Disp: 12 suppository, Rfl: 0 .  lidocaine (XYLOCAINE) 2 % jelly, Apply up to two times a day as needed to affected area for pain relief, Disp: 30 mL, Rfl: 0   Review of Systems:   ROS  Negative unless otherwise specified per HPI.  Vitals:   Vitals:   12/04/18 0947  BP:  120/82  Pulse: 73  Temp: 98.1 F (36.7 C)  TempSrc: Temporal  Weight: 135 lb 8 oz (61.5 kg)  Height: 5\' 7"  (1.702 m)     Body mass index is 21.22 kg/m.  Physical Exam:   Physical Exam Vitals signs and nursing note reviewed. Exam conducted with a chaperone present Butch Penny LPN).  Constitutional:      General: She is not in acute distress.    Appearance: She is well-developed. She is not ill-appearing or toxic-appearing.  Cardiovascular:     Rate and Rhythm: Normal rate and regular rhythm.  Pulses: Normal pulses.     Heart sounds: Normal heart sounds, S1 normal and S2 normal.     Comments: No LE edema Pulmonary:     Effort: Pulmonary effort is normal.     Breath sounds: Normal breath sounds.  Genitourinary:    Comments: External hemorrhoid, approx pea-sized; flesh-colored without evidence thrombosis Skin:    General: Skin is warm and dry.  Neurological:     Mental Status: She is alert.     GCS: GCS eye subscore is 4. GCS verbal subscore is 5. GCS motor subscore is 6.  Psychiatric:        Speech: Speech normal.        Behavior: Behavior normal. Behavior is cooperative.     Results for orders placed or performed in visit on 12/04/18  POCT urinalysis dipstick  Result Value Ref Range   Color, UA Yellow    Clarity, UA Clear    Glucose, UA Negative Negative   Bilirubin, UA Negative    Ketones, UA Negative    Spec Grav, UA 1.020 1.010 - 1.025   Blood, UA Positive    pH, UA 6.0 5.0 - 8.0   Protein, UA Positive (A) Negative   Urobilinogen, UA 0.2 0.2 or 1.0 E.U./dL   Nitrite, UA Negative    Leukocytes, UA Large (3+) (A) Negative   Appearance     Odor      Assessment and Plan:   Ashley [name] was seen today for urinary urgency, dysuria and rectal bleeding.  Diagnoses and all orders for this visit:  Dysuria UA concerning for acute cystitis. Will initiate oral cipro. Culture pending. Worsening precautions advised. -     POCT urinalysis dipstick -     Urine  Culture  Rectal bleeding No red flags on exam. Suspect due to hemorrhoid but will send to GI as she states that she would like referral since she needs a colonoscopy soon anyways (although not due until 2023 based on my records.) Did provide rx for anusol suppositories and lidocaine jelly. Discussed need for high fiber foods, water, and reduced sitting time. -     Ambulatory referral to Gastroenterology  Other orders -     ciprofloxacin (CIPRO) 250 MG tablet; Take 1 tablet (250 mg total) by mouth 2 (two) times daily. -     hydrocortisone (ANUSOL-HC) 25 MG suppository; Place 1 suppository (25 mg total) rectally 2 (two) times daily. -     lidocaine (XYLOCAINE) 2 % jelly; Apply up to two times a day as needed to affected area for pain relief  . Reviewed expectations re: course of current medical issues. . Discussed self-management of symptoms. . Outlined signs and symptoms indicating need for more acute intervention. . Patient verbalized understanding and all questions were answered. . See orders for this visit as documented in the electronic medical record. . Patient received an After-Visit Summary.  CMA or LPN served as scribe during this visit. History, Physical, and Plan performed by medical provider. The above documentation has been reviewed and is accurate and complete.   Jarold MottoSamantha Quincee Gittens, PA-C

## 2018-12-04 NOTE — Patient Instructions (Addendum)
It was great to see you!  For your hemorrhoid, may use Anusol suppositories up to 2 times a day to help with shrinking the tissue.  May also apply lidocaine jelly as needed up to 2 times daily to the affected area to provide some topical pain relief.  You will be contacted by GI for an appointment, and also to discuss colonoscopy.  Please work on continuing to add fiber and water to your diet.  Use a donut pillow or rolled up towels we discussed to take some pressure off of your bottom.  For your UTI, we are going to start Cipro antibiotic.  I will be in touch with culture results as soon as this returns.  General instructions  Make sure you: ? Pee until your bladder is empty. ? Do not hold pee for a long time. ? Empty your bladder after sex. ? Wipe from front to back after pooping if you are a female. Use each tissue one time when you wipe.  Drink enough fluid to keep your pee pale yellow.  Keep all follow-up visits as told by your doctor. This is important. Contact a doctor if:  You do not get better after 1-2 days.  Your symptoms go away and then come back. Get help right away if:  You have very bad back pain.  You have very bad pain in your lower belly.  You have a fever.  You are sick to your stomach (nauseous).  You are throwing up.    Take care,  Inda Coke PA-C

## 2018-12-06 ENCOUNTER — Encounter: Payer: Self-pay | Admitting: Physician Assistant

## 2018-12-06 LAB — URINE CULTURE
MICRO NUMBER:: 1009707
SPECIMEN QUALITY:: ADEQUATE

## 2018-12-31 ENCOUNTER — Other Ambulatory Visit: Payer: Self-pay | Admitting: *Deleted

## 2018-12-31 MED ORDER — LIDOCAINE HCL 2 % EX GEL: CUTANEOUS | 2 refills | Status: DC

## 2019-01-04 ENCOUNTER — Ambulatory Visit (INDEPENDENT_AMBULATORY_CARE_PROVIDER_SITE_OTHER): Payer: Medicare Other | Admitting: Gastroenterology

## 2019-01-04 ENCOUNTER — Encounter: Payer: Self-pay | Admitting: Gastroenterology

## 2019-01-04 VITALS — Ht 67.0 in | Wt 140.0 lb

## 2019-01-04 DIAGNOSIS — K5900 Constipation, unspecified: Secondary | ICD-10-CM | POA: Diagnosis not present

## 2019-01-04 DIAGNOSIS — K649 Unspecified hemorrhoids: Secondary | ICD-10-CM | POA: Diagnosis not present

## 2019-01-04 DIAGNOSIS — K625 Hemorrhage of anus and rectum: Secondary | ICD-10-CM | POA: Diagnosis not present

## 2019-01-04 NOTE — Progress Notes (Signed)
THIS ENCOUNTER IS A VIRTUAL VISIT DUE TO COVID-19 - PATIENT WAS NOT SEEN IN THE OFFICE. PATIENT HAS CONSENTED TO VIRTUAL VISIT / TELEMEDICINE VISIT.  We attempted to use audio and visual with Doximity app however the patient could not get her phone to work appropriately for this.  This visit was done completely using telephone.   Location of patient: home Location of provider: office Name of referring provider: Helane RimaErica Wallace MD Persons participating: myself, patient Time spent on call: 15 minutes   HPI :  78 y/o female with history of diverticulosis, osteoporosis, anxiety, referred by Helane RimaErica Wallace for concern about blood in the stool.  The patient states she believes she is having symptoms from hemorrhoids in recent months.  She states on 2 occasions in the past few months she had red blood noted on the toilet paper when wiping herself in the setting of straining.  She denies any overt blood in the stool or toilet otherwise.  She has had some constipation at times with straining, she has been using MiraLAX every day as well as Colace recently, which has helped.  She also uses milk of magnesia otherwise.  She had a UTI at the same time when this bleeding was occurring but does not appear to have had blood in the urine.  Again she emphasizes blood was a scant amount and only on the toilet paper.  She otherwise feels well.  No abdominal pains.  No family history of colon cancer.  Her last colonoscopy was in 2013 which was normal.  Her colonoscopy prior to that was in 2001 which was also normal other than diverticulosis.  She is never had any polyps removed.  Colonoscopy 06/28/2011 - sigmoid diverticulosis, otherwise normal  Colonoscopy 06/21/1999 - diverticulosis, hemorrhoids  Past Medical History:  Diagnosis Date  . Anxiety state, unspecified   . Diverticulosis of colon (without mention of hemorrhage)   . Osteoporosis, unspecified   . Other chest pain   . Other malaise and fatigue   .  Personal history of other malignant neoplasm of skin   . Unspecified hemorrhoids without mention of complication   . Unspecified hypothyroidism      Past Surgical History:  Procedure Laterality Date  . COLONOSCOPY    . uterine polyp removal     Family History  Problem Relation Age of Onset  . Ovarian cancer Mother   . Pancreatic cancer Brother   . Diabetes Other   . Prostate cancer Brother   . Breast cancer Maternal Aunt   . Breast cancer Paternal Aunt   . Colon cancer Neg Hx   . Esophageal cancer Neg Hx   . Stomach cancer Neg Hx   . Liver disease Neg Hx    Social History   Tobacco Use  . Smoking status: Never Smoker  . Smokeless tobacco: Never Used  Substance Use Topics  . Alcohol use: No  . Drug use: No   Current Outpatient Medications  Medication Sig Dispense Refill  . amLODipine (NORVASC) 5 MG tablet Take 1 tablet (5 mg total) by mouth daily. 90 tablet 3  . aspirin 81 MG tablet Take 81 mg by mouth at bedtime.     . Calcium Carb-Cholecalciferol (CALTRATE 600+D) 600-800 MG-UNIT TABS Take 1 tablet by mouth daily.    Marland Kitchen. docusate sodium (COLACE) 100 MG capsule Take 100 mg by mouth daily.    Marland Kitchen. lidocaine (XYLOCAINE) 2 % jelly Apply up to two times a day as needed to affected area for  pain relief 30 mL 2  . LORazepam (ATIVAN) 1 MG tablet TAKE 0.5 TO 1.5 TABLETS BY MOUTH THREE TIMES DAILY AS NEEDED FOR ANXIETY 135 tablet 1  . MAGNESIUM PO Take 1 tablet by mouth as needed.    . Multiple Vitamins-Minerals (PRESERVISION AREDS PO) Take 1 tablet by mouth daily.    Marland Kitchen nystatin-triamcinolone ointment (MYCOLOG) Apply 1 application topically 2 (two) times daily as needed.    Marland Kitchen OVER THE COUNTER MEDICATION Take 1 capsule by mouth See admin instructions. (Methyl-B Complete) Methyl Folate + with L-5-MTHF+Methyl B12 capsule = Take 1 capsule by mouth once a day    . PRESCRIPTION MEDICATION Place 1 application vaginally 2 (two) times a week. Estradiol Cream 0.2mg /ml    . SYNTHROID 88 MCG  tablet TK 1 T PO QD IN THE MORNING OES 90 tablet 3   No current facility-administered medications for this visit.    Allergies  Allergen Reactions  . Macrobid [Nitrofurantoin] Shortness Of Breath  . Alendronate Sodium Other (See Comments)    FOSAMAX: Patient states "blisters in my mouth"  . Penicillins Other (See Comments)    Caused fatigue/malaise, "feels extremely bad" Did PCN reaction causing immediate rash, facial/tongue/throat swelling, SOB or lightheadedness with hypotension: No Has patient had a PCN reaction causing severe rash involving mucus membranes or skin necrosis: No Has patient had a PCN reaction that required hospitalization: No Has patient had a PCN reaction occurring within the last 10 years: Yes If all of the above answers are "NO", then may proceed with Cephalosporin use. Extremely lethargic  . Raloxifene Other (See Comments)    EVISTA: Patient states "it dried me out"  . Risedronate Sodium Other (See Comments)    ACTONEL: Patient states "teeth problems" (??)  . Shellfish-Derived Products Itching    Throat started to itch  . Citalopram Anxiety  . Latex Rash     Review of Systems: All systems reviewed and negative except where noted in HPI.   Lab Results  Component Value Date   WBC 5.9 03/30/2018   HGB 13.8 03/30/2018   HCT 40.7 03/30/2018   MCV 94.7 03/30/2018   PLT 222 03/30/2018     Physical Exam: Ht 5\' 7"  (1.702 m)   Wt 140 lb (63.5 kg)   BMI 21.93 kg/m  NA.   ASSESSMENT AND PLAN: 78 year old female here to discuss following issues:  Hemorrhoids / rectal bleeding / constipation - patient preferred to telephone visit only today so there are some limitations as I cannot examine her for this issue, and she understands this, however she had a rectal exam by her primary care physician which was notable for only hemorrhoids.  She has had 2 episodes of very scant superficial bleeding as described, this is very likely due to hemorrhoids however  cannot say for sure without examining her.  We discussed colonoscopy.  I do not feel strongly that she needs it right now for 2 self-limited episodes of scant bleeding, however would like to see her in the office in a few months for reassessment.  If she has continued episodes or overt blood in the stool then I would pursue colonoscopy.  She wants to avoid colonoscopy if possible and will monitor for now.  She should continue her regimen for constipation and make sure she avoid straining and keep stools soft.  If she has issues with this in the interim I asked her to call me back.  Otherwise I will see her in a few months for reassessment.  She wishes to avoid in person visit until the Covid crisis is under better control.  All questions answered she agreed with the plan  15 minutes on phone  Ileene Patrick, MD [facility]  CC: Helane Rima, DO

## 2019-02-06 ENCOUNTER — Telehealth: Payer: Self-pay | Admitting: Family Medicine

## 2019-02-06 NOTE — Telephone Encounter (Signed)
See note  Copied from [place] [id number]. Topic: General - Other >> Feb 06, 2019  8:51 AM Antonieta Iba C wrote: Reason for CRM: pt would like to have a virtual apt for [facility] visit and would like to know if possible?    Please assist [phone] >> Feb 06, 2019  9:04 AM Carole Binning B wrote: Call to set up, [facility] visits should never be a telephone visit. They can be set up virtually but providers prefer in person if possible.

## 2019-02-14 ENCOUNTER — Ambulatory Visit (INDEPENDENT_AMBULATORY_CARE_PROVIDER_SITE_OTHER): Payer: Medicare Other | Admitting: Family Medicine

## 2019-02-14 ENCOUNTER — Encounter: Payer: Self-pay | Admitting: Family Medicine

## 2019-02-14 VITALS — BP 140/81 | Ht 67.0 in | Wt 140.0 lb

## 2019-02-14 DIAGNOSIS — Z8639 Personal history of other endocrine, nutritional and metabolic disease: Secondary | ICD-10-CM | POA: Diagnosis not present

## 2019-02-14 DIAGNOSIS — F419 Anxiety disorder, unspecified: Secondary | ICD-10-CM | POA: Diagnosis not present

## 2019-02-14 DIAGNOSIS — E039 Hypothyroidism, unspecified: Secondary | ICD-10-CM

## 2019-02-14 DIAGNOSIS — F5104 Psychophysiologic insomnia: Secondary | ICD-10-CM

## 2019-02-14 DIAGNOSIS — M8589 Other specified disorders of bone density and structure, multiple sites: Secondary | ICD-10-CM

## 2019-02-14 DIAGNOSIS — E049 Nontoxic goiter, unspecified: Secondary | ICD-10-CM

## 2019-02-14 DIAGNOSIS — N952 Postmenopausal atrophic vaginitis: Secondary | ICD-10-CM

## 2019-02-14 DIAGNOSIS — Z79899 Other long term (current) drug therapy: Secondary | ICD-10-CM

## 2019-02-14 DIAGNOSIS — I10 Essential (primary) hypertension: Secondary | ICD-10-CM

## 2019-02-14 NOTE — Progress Notes (Signed)
Virtual Visit via Video Note  Subjective  CC:  Chief Complaint  Patient presents with  . Transitions Of Care    transitioning care from Dr. Juleen China     I connected with Christean Grief Brouillard on 02/14/19 at  8:00 AM EST by a video enabled telemedicine application and verified that I am speaking with the correct person using two identifiers. Location patient: Home Location provider: [facility], Office Persons participating in the virtual visit: Margi Edmundson Sturges, Leamon Arnt, MD Valetta Mole, CMA  I discussed the limitations of evaluation and management by telemedicine and the availability of in person appointments. The patient expressed understanding and agreed to proceed. HPI: Monifa Blanchette Gamboa is a 78 y.o. female who was contacted today to address the problems listed above in the chief complaint. . TOC: i've reviewed multiple old records from her prior PCPs, GYN, GI including imaging reports and lab results. We discussed in detail her chronic medical problems and treatment plans for each. Her med list and PL were updated accordingly. . Overall she does well. Suffers from anxiety and trying to work on that behaviorally. Has chronic insomnia on chronic benzo's x many years. Has failed other sleep aides. C/o "brain fog" . Anxiety: failed SSRIs.  Marland Kitchen HTN has been well controlled. Due for cpe and labs.  . Osteopenia but failed multiple treatment options. Due for dexa repeat with GYN. I will get records. Discussed drug reactions. . Thyroid: chonic hypothyroidism and bout with thyroiditis now well controlled. Last checked in may. No sxs of high or low thyroid.  . Atrophic vaginitis on nightly estrogen creams.  . Social: married, 2 grown children in the area. Struggles with pandemic restrictions but feels well. Active.  Assessment  1. Acquired hypothyroidism   2. History of Hashimoto thyroiditis   3. Chronic anxiety   4. Anxiety   5. Chronic prescription benzodiazepine use     6. Psychophysiological insomnia   7. Goiter   8. Atrophic vaginitis   9. Osteopenia of multiple sites   10. Essential hypertension      Plan   Chronic problems are controlled:  No changes recommended today. Pt to schedule an eye exam, AWV for MMSE, and f/u with me spring for cpe with labs.   Today's visit was 40 minutes long. Greater than 50% of this time was devoted to face to face counseling with the patient and coordination of care. We discussed her diagnosis, prognosis, treatment options and treatment plan is documented above.    I discussed the assessment and treatment plan with the patient. The patient was provided an opportunity to ask questions and all were answered. The patient agreed with the plan and demonstrated an understanding of the instructions.   The patient was advised to call back or seek an in-person evaluation if the symptoms worsen or if the condition fails to improve as anticipated. Follow up: Return in about 4 months (around 06/14/2019) for complete physical, AWV at patient's convenience.  Visit date not found  No orders of the defined types were placed in this encounter.     I reviewed the patients updated PMH, FH, and SocHx.    Patient Active Problem List   Diagnosis Date Noted  . Acquired hypothyroidism 02/14/2019    Priority: High  . Chronic prescription benzodiazepine use 02/14/2019    Priority: High  . Psychophysiological insomnia 03/26/2018    Priority: High  . Essential hypertension 06/17/2015  Priority: High  . History of Hashimoto thyroiditis 2017 05/03/2007    Priority: High  . Chronic anxiety 05/03/2007    Priority: High  . Osteopenia 08/19/2013    Priority: Medium  . Atrophic vaginitis 02/14/2019    Priority: Low  . Goiter     Priority: Low  . Vitamin D deficiency 06/17/2014    Priority: Low  . Diverticulosis of large intestine 05/04/2007    Priority: Low  . Dysthymia 06/17/2015   Current Meds  Medication Sig  . amLODipine  (NORVASC) 5 MG tablet Take 1 tablet (5 mg total) by mouth daily.  Marland Kitchen aspirin 81 MG tablet Take 81 mg by mouth at bedtime.   . Calcium Carb-Cholecalciferol (CALTRATE 600+D) 600-800 MG-UNIT TABS Take 1 tablet by mouth daily.  . Cholecalciferol (VITAMIN D3) 1.25 MG (50000 UT) TABS Take by mouth.  . docusate sodium (COLACE) 100 MG capsule Take 100 mg by mouth daily.  Marland Kitchen LORazepam (ATIVAN) 1 MG tablet Take 1 tablet (1 mg total) by mouth at bedtime.  Marland Kitchen MAGNESIUM PO Take 1 tablet by mouth as needed.  . Multiple Vitamins-Minerals (PRESERVISION AREDS PO) Take 1 tablet by mouth daily.  Marland Kitchen SYNTHROID 88 MCG tablet TK 1 T PO QD IN THE MORNING OES  . [DISCONTINUED] LORazepam (ATIVAN) 1 MG tablet TAKE 0.5 TO 1.5 TABLETS BY MOUTH THREE TIMES DAILY AS NEEDED FOR ANXIETY  . [DISCONTINUED] PRESCRIPTION MEDICATION Place 1 application vaginally 2 (two) times a week. Estradiol Cream 0.2mg /ml    Allergies: Patient is allergic to macrobid [nitrofurantoin]; alendronate sodium; penicillins; raloxifene; risedronate sodium; shellfish-derived products; citalopram; and latex. Family History: Patient family history includes Breast cancer in her maternal aunt and paternal aunt; Diabetes in an other family member; Ovarian cancer in her mother; Pancreatic cancer in her brother; Prostate cancer in her brother. Social History:  Patient  reports that she has never smoked. She has never used smokeless tobacco. She reports that she does not drink alcohol or use drugs.  Review of Systems: Constitutional: Negative for fever malaise or anorexia Cardiovascular: negative for chest pain Respiratory: negative for SOB or persistent cough Gastrointestinal: negative for abdominal pain  OBJECTIVE Vitals: BP 140/81 (BP Location: Left Arm)   Ht 5\' 7"  (1.702 m)   Wt 140 lb (63.5 kg)   BMI 21.93 kg/m  General: no acute distress , A&Ox3  , MD

## 2019-03-05 ENCOUNTER — Telehealth: Payer: Self-pay | Admitting: Family Medicine

## 2019-03-05 NOTE — Telephone Encounter (Signed)
LVM for patient to return call. 

## 2019-03-05 NOTE — Telephone Encounter (Signed)
[place] [facility] [place] RECORD [facility] Patient Name: Ashley [name] Gender: Female DOB: 06/23/1940 Age: 79 Y 9 M 21 D Return Phone Number: [phone] (Primary), [phone] (Secondary) Address: City/State/Zip: [place] [address] Client [facility] Client Site [facility] [facility] [place] Night Contact Type Call Who Is Calling Patient / Member / Family / Caregiver Call Type Triage / Clinical Caller Name [place] Relationship To Patient Spouse Return Phone Number [phone] (Secondary) Chief Complaint Headache Reason for Call Symptomatic / Request for [place] states that his wife has a blood pressure of 135/87 that is going up and down a lot. Woke up and top # was 167. Caller states that she has a headache. Dr. Jonni Sanger, verified location. Translation No Nurse Assessment Nurse: Danford Bad, RN, Anderson Malta Date/Time (Eastern Time): 03/04/2019 5:45:30 PM Confirm and document reason for call. If symptomatic, describe symptoms. ---Caller states that she has a headache and questions regarding her medications. Her daughter suggested that she take her BP sitting and standing so she has been doing that and has noticed that there is a difference between the two. This headache has been intermittent and located in the front of her head. Has the patient had close contact with a person known or suspected to have the novel coronavirus illness OR traveled / lives in area with major community spread (including international travel) in the last 14 days from the onset of symptoms? * If Asymptomatic, screen for exposure and travel within the last 14 days. ---No Does the patient have any new or worsening symptoms? ---Yes Will a triage be completed? ---Yes Related visit to physician within the last 2 weeks? ---No Does the PT have any chronic conditions? (i.e.  diabetes, asthma, this includes High risk factors for pregnancy, etc.) ---Yes List chronic conditions. ---thyroid disease and HTN Is this a behavioral health or substance abuse call? ---No Guidelines Guideline Title Affirmed Question Affirmed Notes Nurse Date/Time (Eastern Time) Headache [1] New headache AND [2] age > 70 Kathaleen Bury 03/04/2019 5:48:14 PM PLEASE NOTE: All timestamps contained within this report are represented as [place] Standard Time. CONFIDENTIALTY NOTICE: This fax transmission is intended only for the addressee. It contains information that is legally privileged, confidential or otherwise protected from use or disclosure. If you are not the intended recipient, you are strictly prohibited from reviewing, disclosing, copying using or disseminating any of this information or taking any action in reliance on or regarding this information. If you have received this fax in error, please notify us immediately by telephone so that we can arrange for its return to [place]. Phone: [phone], Toll-Free: [phone], Fax: [phone] Page: 2 of 2 Call Id: [id number] [place]. Time Eilene Ghazi Time) Disposition Final User 03/04/2019 6:00:00 PM See PCP within 24 Hours Yes Danford Bad, RN, Nell Range Disagree/Comply Comply Caller Understands Yes PreDisposition Call Doctor Care Advice Given Per Guideline SEE PCP WITHIN 24 HOURS: * IF OFFICE WILL BE OPEN: You need to be seen within the next 24 hours. Call your doctor (or NP/PA) when the office opens and make an appointment. * ACETAMINOPHEN - EXTRA STRENGTH TYLENOL: Take 1,000 mg (two 500 mg pills) every 8 hours as needed. Each Extra Strength Tylenol pill has 500 mg of acetaminophen. The most you should take each day is 3,000 mg (6 pills a day). CARE ADVICE given per Headache (Adult) guideline. * Blurred vision or double vision occurs * Numbness or  weakness of the face, arm or leg * Difficulty with speaking * You become worse. CALL BACK  IF: Referrals REFERRED TO PCP OFFICE

## 2019-03-05 NOTE — Telephone Encounter (Signed)
Patient notified to stop her aspirin for now and to schedule a f/u for b/p and headaches. Patient states she will monitor b/p and she how she is feeling for the next day or two and will call back if she is not better.

## 2019-03-05 NOTE — Telephone Encounter (Signed)
Rec office visit for blood pressure follow and headaches: see other triage note.  May stop aspirin for now.

## 2019-03-05 NOTE — Telephone Encounter (Signed)
Pt called asking if she could speak with Dr. Mardelle Matte and the nurse about some issues she is having. She is concerned that these issues may interact with the COVID vaccine or develop complications. Pt is specifically asking about headaches, BP, and being on aspirin. Please advise.

## 2019-03-05 NOTE — Telephone Encounter (Signed)
Patient wants to know if she needs to stop Aspirin 81. She did have some pressure bleeds and bleeding in her mouth that occurred two days ago. Her blood pressure has been up and down. 136/84 this morning and hr is 83 this highest reading 166/88 yesterday morning with the headache. She denotes to feeling better today, but with just slight brain fog. She took Tylenol which helped to alleviate the pain. She still wants to be able to take the Covid vaccine and is on the waitlist for that. Please advise.

## 2019-03-05 NOTE — Telephone Encounter (Signed)
Please advise. See other message.

## 2019-03-07 ENCOUNTER — Other Ambulatory Visit: Payer: Self-pay

## 2019-03-08 ENCOUNTER — Encounter: Payer: Self-pay | Admitting: Family Medicine

## 2019-03-08 ENCOUNTER — Telehealth: Payer: Self-pay

## 2019-03-08 ENCOUNTER — Ambulatory Visit (INDEPENDENT_AMBULATORY_CARE_PROVIDER_SITE_OTHER): Payer: Medicare Other | Admitting: Family Medicine

## 2019-03-08 VITALS — BP 152/70 | HR 78 | Temp 98.0°F | Ht 67.0 in | Wt 130.4 lb

## 2019-03-08 DIAGNOSIS — E039 Hypothyroidism, unspecified: Secondary | ICD-10-CM

## 2019-03-08 DIAGNOSIS — I10 Essential (primary) hypertension: Secondary | ICD-10-CM

## 2019-03-08 DIAGNOSIS — I951 Orthostatic hypotension: Secondary | ICD-10-CM | POA: Diagnosis not present

## 2019-03-08 DIAGNOSIS — F419 Anxiety disorder, unspecified: Secondary | ICD-10-CM

## 2019-03-08 DIAGNOSIS — Z8639 Personal history of other endocrine, nutritional and metabolic disease: Secondary | ICD-10-CM

## 2019-03-08 LAB — CBC WITH DIFFERENTIAL/PLATELET
Basophils Absolute: 0 10*3/uL (ref 0.0–0.1)
Basophils Relative: 0.9 % (ref 0.0–3.0)
Eosinophils Absolute: 0 10*3/uL (ref 0.0–0.7)
Eosinophils Relative: 0.4 % (ref 0.0–5.0)
HCT: 42.4 % (ref 36.0–46.0)
Hemoglobin: 14.4 g/dL (ref 12.0–15.0)
Lymphocytes Relative: 27.8 % (ref 12.0–46.0)
Lymphs Abs: 1.2 10*3/uL (ref 0.7–4.0)
MCHC: 33.9 g/dL (ref 30.0–36.0)
MCV: 95 fl (ref 78.0–100.0)
Monocytes Absolute: 0.3 10*3/uL (ref 0.1–1.0)
Monocytes Relative: 8.1 % (ref 3.0–12.0)
Neutro Abs: 2.7 10*3/uL (ref 1.4–7.7)
Neutrophils Relative %: 62.8 % (ref 43.0–77.0)
Platelets: 205 10*3/uL (ref 150.0–400.0)
RBC: 4.46 Mil/uL (ref 3.87–5.11)
RDW: 12.9 % (ref 11.5–15.5)
WBC: 4.2 10*3/uL (ref 4.0–10.5)

## 2019-03-08 LAB — COMPREHENSIVE METABOLIC PANEL
ALT: 13 U/L (ref 0–35)
AST: 14 U/L (ref 0–37)
Albumin: 4.2 g/dL (ref 3.5–5.2)
Alkaline Phosphatase: 54 U/L (ref 39–117)
BUN: 18 mg/dL (ref 6–23)
CO2: 28 mEq/L (ref 19–32)
Calcium: 9.6 mg/dL (ref 8.4–10.5)
Chloride: 100 mEq/L (ref 96–112)
Creatinine, Ser: 0.83 mg/dL (ref 0.40–1.20)
GFR: 66.18 mL/min (ref >60.00)
Glucose, Bld: 120 mg/dL — ABNORMAL HIGH (ref 70–99)
Potassium: 4.5 mEq/L (ref 3.5–5.1)
Sodium: 136 mEq/L (ref 135–145)
Total Bilirubin: 0.7 mg/dL (ref 0.2–1.2)
Total Protein: 7 g/dL (ref 6.0–8.3)

## 2019-03-08 LAB — TSH: TSH: 1.05 u[IU]/mL (ref 0.35–4.50)

## 2019-03-08 MED ORDER — AMLODIPINE BESYLATE 5 MG PO TABS: 2.5000 mg | ORAL_TABLET | Freq: Every day | ORAL | 3 refills | Status: DC

## 2019-03-08 NOTE — Telephone Encounter (Signed)
Patient would  like for the nurse to call her back regarding her BP pill. Pt is unsure if she should take a half of pill at night and or should it be take it during the day.

## 2019-03-08 NOTE — Patient Instructions (Addendum)
Please schedule a virtual with me in 3 weeks to recheck your lightheadedness and blood pressure.  Please cut your blood pressure pill in half.  I will release your lab results to you on your MyChart account with further instructions. Please reply with any questions.   If you have any questions or concerns, please don't hesitate to send me a message via MyChart or call the office at [phone]. Thank you for visiting with Ashley [name] today! It's our pleasure caring for you.   Hypotension As your heart beats, it forces blood through your body. This force is called blood pressure. If you have hypotension, you have low blood pressure. When your blood pressure is too low, you may not get enough blood to your brain or other parts of your body. This may cause you to feel weak, light-headed, have a fast heartbeat, or even pass out (faint). Low blood pressure may be harmless, or it may cause serious problems. What are the causes?  Blood loss.  Not enough water in the body (dehydration).  Heart problems.  Hormone problems.  Pregnancy.  A very bad infection.  Not having enough of certain nutrients.  Very bad allergic reactions.  Certain medicines. What increases the risk?  Age. The risk increases as you get older.  Conditions that affect the heart or the brain and spinal cord (central nervous system).  Taking certain medicines.  Being pregnant. What are the signs or symptoms?  Feeling: ? Weak. ? Light-headed. ? Dizzy. ? Tired (fatigued).  Blurred vision.  Fast heartbeat.  Passing out, in very bad cases. How is this treated?  Changing your diet. This may involve eating more salt (sodium) or drinking more water.  Taking medicines to raise your blood pressure.  Changing how much you take (the dosage) of some of your medicines.  Wearing compression stockings. These stockings help to prevent blood clots and reduce swelling in your legs. In some cases, you may need to go to the  hospital for:  Fluid replacement. This means you will receive fluids through an IV tube.  Blood replacement. This means you will receive donated blood through an IV tube (transfusion).  Treating an infection or heart problems, if this applies.  Monitoring. You may need to be monitored while medicines that you are taking wear off. Follow these instructions at home: Eating and drinking   Drink enough fluids to keep your pee (urine) pale yellow.  Eat a healthy diet. Follow instructions from your doctor about what you can eat or drink. A healthy diet includes: ? Fresh fruits and vegetables. ? Whole grains. ? Low-fat (lean) meats. ? Low-fat dairy products.  Eat extra salt only as told. Do not add extra salt to your diet unless your doctor tells you to.  Eat small meals often.  Avoid standing up quickly after you eat. Medicines  Take over-the-counter and prescription medicines only as told by your doctor. ? Follow instructions from your doctor about changing how much you take of your medicines, if this applies. ? Do not stop or change any of your medicines on your own. General instructions   Wear compression stockings as told by your doctor.  Get up slowly from lying down or sitting.  Avoid hot showers and a lot of heat as told by your doctor.  Return to your normal activities as told by your doctor. Ask what activities are safe for you.  Do not use any products that contain nicotine or tobacco, such as cigarettes, e-cigarettes, and chewing  tobacco. If you need help quitting, ask your doctor.  Keep all follow-up visits as told by your doctor. This is important. Contact a doctor if:  You throw up (vomit).  You have watery poop (diarrhea).  You have a fever for more than 2-3 days.  You feel more thirsty than normal.  You feel weak and tired. Get help right away if:  You have chest pain.  You have a fast or uneven heartbeat.  You lose feeling (have numbness) in  any part of your body.  You cannot move your arms or your legs.  You have trouble talking.  You get sweaty or feel light-headed.  You pass out.  You have trouble breathing.  You have trouble staying awake.  You feel mixed up (confused). Summary  Hypotension is also called low blood pressure. It is when the force of blood pumping through your arteries is too weak.  Hypotension may be harmless, or it may cause serious problems.  Treatment may include changing your diet and medicines, and wearing compression stockings.  In very bad cases, you may need to go to the hospital. This information is not intended to replace advice given to you by your health care provider. Make sure you discuss any questions you have with your health care provider. Document Revised: 07/27/2017 Document Reviewed: 07/27/2017 Elsevier Patient Education  [place].

## 2019-03-08 NOTE — Telephone Encounter (Signed)
Spoke with pt to inform.  

## 2019-03-08 NOTE — Progress Notes (Signed)
Subjective  CC:  Chief Complaint  Patient presents with  . Hypertension    Readings going up and down. experiencing weakness today  . Headache  . Hypothyroidism  . Dizziness    Dx with Hasimoto's in 2018. Sx worsening.    HPI: Ashley [name] is a 79 y.o. female who presents to the office today to address the problems listed above in the chief complaint.  79 year old reports over the last 2 weeks she becomes lightheaded with standing.  This is happened on several occasions.  She is brought in blood pressure readings that documented hypotension with standing.  She has not passed out but felt like she could.  She also feels weak.  At times she will have headaches.  She denies associated palpitations, chest pain, diaphoresis, nausea or vomiting.  She has had no changes in medications.  She is on amlodipine for hypertension.  Thyroid disorder: She is worried that this is due to recurrent Hashimoto's. Lab Results  Component Value Date   TSH 1.00 06/29/2018    Assessment  1. Orthostatic hypotension   2. Essential hypertension   3. Acquired hypothyroidism   4. Chronic anxiety   5. History of Hashimoto thyroiditis 2017      Plan    Orthostatic hypotension, symptomatic: Educated.  Likely due to autonomic dysfunction.  Decrease amlodipine to 2.5 mg daily.  Patient cautioned against getting up quickly.  She is to maintain good hydration.  She is using compression stockings.  Close follow-up.  Recheck 4 weeks.  Check lab work today.  Hypothyroidism: Recheck TSH today.  Doubt related  Anxiety: Reassured.  Education regarding management of these chronic disease states was given. Management strategies discussed on successive visits include dietary and exercise recommendations, goals of achieving and maintaining IBW, and lifestyle modifications aiming for adequate sleep and minimizing stressors.   Follow up: Return in about 3 weeks (around 03/29/2019) for recheck blood pressure and  lightheadedness.  Orders Placed This Encounter  Procedures  . CBC with Differential/Platelet  . Comprehensive metabolic panel  . TSH   Meds ordered this encounter  Medications  . amLODipine (NORVASC) 5 MG tablet    Sig: Take 0.5 tablets (2.5 mg total) by mouth daily.    Dispense:  90 tablet    Refill:  3      BP Readings from Last 3 Encounters:  03/08/19 (!) 152/70  02/14/19 140/81  12/04/18 120/82   Wt Readings from Last 3 Encounters:  03/08/19 130 lb 6.4 oz (59.1 kg)  02/14/19 140 lb (63.5 kg)  01/04/19 140 lb (63.5 kg)    Lab Results  Component Value Date   CHOL 190 08/15/2017   CHOL 188 06/20/2016   CHOL 194 06/17/2015   Lab Results  Component Value Date   HDL 81.60 08/15/2017   HDL 81.50 06/20/2016   HDL 83.00 06/17/2015   Lab Results  Component Value Date   LDLCALC 93 08/15/2017   LDLCALC 92 06/20/2016   LDLCALC 90 06/17/2015   Lab Results  Component Value Date   TRIG 76.0 08/15/2017   TRIG 70.0 06/20/2016   TRIG 107.0 06/17/2015   Lab Results  Component Value Date   CHOLHDL 2 08/15/2017   CHOLHDL 2 06/20/2016   CHOLHDL 2 06/17/2015   Lab Results  Component Value Date   LDLDIRECT 97.1 05/31/2011   LDLDIRECT 107.3 05/27/2009   Lab Results  Component Value Date   CREATININE 1.04 (H) 03/30/2018   BUN 14 03/30/2018   NA  137 03/30/2018   K 3.8 03/30/2018   CL 98 03/30/2018   CO2 27 03/30/2018    The 10-year ASCVD risk score Mikey Bussing DC Jr., et al., 2013) is: 43.3%   Values used to calculate the score:     Age: 70 years     Sex: Female     Is Non-Hispanic African American: No     Diabetic: No     Tobacco smoker: No     Systolic Blood Pressure: 782 mmHg     Is BP treated: Yes     HDL Cholesterol: 81.6 mg/dL     Total Cholesterol: 190 mg/dL  I reviewed the patients updated PMH, FH, and SocHx.    Patient Active Problem List   Diagnosis Date Noted  . Acquired hypothyroidism 02/14/2019    Priority: High  . Chronic prescription  benzodiazepine use 02/14/2019    Priority: High  . Psychophysiological insomnia 03/26/2018    Priority: High  . Essential hypertension 06/17/2015    Priority: High  . History of Hashimoto thyroiditis 2017 05/03/2007    Priority: High  . Chronic anxiety 05/03/2007    Priority: High  . Osteopenia 08/19/2013    Priority: Medium  . Atrophic vaginitis 02/14/2019    Priority: Low  . Goiter     Priority: Low  . Vitamin D deficiency 06/17/2014    Priority: Low  . Diverticulosis of large intestine 05/04/2007    Priority: Low  . Dysthymia 06/17/2015    Allergies: Macrobid [nitrofurantoin], Alendronate sodium, Penicillins, Raloxifene, Risedronate sodium, Shellfish-derived products, Citalopram, and Latex  Social History: Patient  reports that she has never smoked. She has never used smokeless tobacco. She reports that she does not drink alcohol or use drugs.  Current Meds  Medication Sig  . amLODipine (NORVASC) 5 MG tablet Take 0.5 tablets (2.5 mg total) by mouth daily.  Marland Kitchen aspirin 81 MG tablet Take 81 mg by mouth at bedtime.   . Calcium Carb-Cholecalciferol (CALTRATE 600+D) 600-800 MG-UNIT TABS Take 1 tablet by mouth daily.  . Cholecalciferol (VITAMIN D3) 1.25 MG (50000 UT) TABS Take by mouth.  . docusate sodium (COLACE) 100 MG capsule Take 100 mg by mouth daily.  Marland Kitchen estradiol (ESTRACE) 0.1 MG/GM vaginal cream Place 2 Applicatorfuls vaginally at bedtime.  . Flaxseed, Linseed, (FLAXSEED OIL) OIL Take by mouth.  Marland Kitchen LORazepam (ATIVAN) 1 MG tablet Take 1 tablet (1 mg total) by mouth at bedtime.  Marland Kitchen MAGNESIUM PO Take 1 tablet by mouth as needed.  . Multiple Vitamins-Minerals (PRESERVISION AREDS PO) Take 1 tablet by mouth daily.  Marland Kitchen SYNTHROID 88 MCG tablet TK 1 T PO QD IN THE MORNING OES  . [DISCONTINUED] amLODipine (NORVASC) 5 MG tablet Take 1 tablet (5 mg total) by mouth daily.    Review of Systems: Cardiovascular: negative for chest pain, palpitations, leg swelling, orthopnea Respiratory:  negative for SOB, wheezing or persistent cough Gastrointestinal: negative for abdominal pain Genitourinary: negative for dysuria or gross hematuria  Objective  Vitals: BP (!) 152/70 (BP Location: Right Arm, Patient Position: Sitting, Cuff Size: Normal)   Pulse 78   Temp 98 F (36.7 C) (Temporal)   Ht 5\' 7"  (1.702 m)   Wt 130 lb 6.4 oz (59.1 kg)   SpO2 94%   BMI 20.42 kg/m  Positive orthostatic hypotension documented in office today. General: no acute distress  Psych:  Alert and oriented, normal mood and affect HEENT:  Normocephalic, atraumatic, supple neck  Cardiovascular:  RRR without murmur. no edema Respiratory:  Good breath sounds bilaterally, CTAB with normal respiratory effort Skin:  Warm, no rashes Neurologic:   Mental status is normal  Commons side effects, risks, benefits, and alternatives for medications and treatment plan prescribed today were discussed, and the patient expressed understanding of the given instructions. Patient is instructed to call or message via MyChart if he/she has any questions or concerns regarding our treatment plan. No barriers to understanding were identified. We discussed Red Flag symptoms and signs in detail. Patient expressed understanding regarding what to do in case of urgent or emergency type symptoms.   Medication list was reconciled, printed and provided to the patient in AVS. Patient instructions and summary information was reviewed with the patient as documented in the AVS. This note was prepared with assistance of Dragon voice recognition software. Occasional wrong-word or sound-a-like substitutions may have occurred due to the inherent limitations of voice recognition software  This visit occurred during the SARS-CoV-2 public health emergency.  Safety protocols were in place, including screening questions prior to the visit, additional usage of staff PPE, and extensive cleaning of exam room while observing appropriate contact time as  indicated for disinfecting solutions.

## 2019-03-08 NOTE — Telephone Encounter (Signed)
Please advise what time of day pt should take BP medication

## 2019-03-08 NOTE — Telephone Encounter (Signed)
Either time is fine. Just be consistent.

## 2019-03-15 ENCOUNTER — Ambulatory Visit: Payer: Medicare Other | Admitting: Family Medicine

## 2019-03-19 ENCOUNTER — Telehealth: Payer: Self-pay

## 2019-03-19 NOTE — Telephone Encounter (Signed)
Patient notified to continue with half a pill. She was also notified to schedule an earlier ov if she will like to discuss more with Dr. Mardelle Matte before 2/12.

## 2019-03-19 NOTE — Telephone Encounter (Signed)
LVM for patient to return call. 

## 2019-03-19 NOTE — Telephone Encounter (Signed)
Continue half a pill. Can schedule a follow up visit early to discuss further if she'd like, and recheck orthostatics in the office. I am ok with the blood pressure of 160/70 right now.   Would need to cancel the 2/12 appt if she schedules early. Thanks.

## 2019-03-19 NOTE — Telephone Encounter (Signed)
Patient states that blood pressure went up 163/91 heart rate 89. Patients states she is taking medication and would like for Dr. Mardelle Matte to call her back to discuss this situation.

## 2019-03-19 NOTE — Telephone Encounter (Signed)
Patient states that she checked it this morning and that was her reading. She is currently taking just half the pill in the morning and half at night. Patient woke up shaking feeling jittery. Her reading last night was 160/70. She wants to know if she needs to continue taking half or to start taking a whole pill. Patient states she is having many side effects from the medications. She wakes up having SOB at night, weakness in the afternoon, and feeling foggy headed. Sx occur on and off. Please advise.

## 2019-03-26 ENCOUNTER — Other Ambulatory Visit: Payer: Self-pay

## 2019-03-26 ENCOUNTER — Telehealth: Payer: Self-pay

## 2019-03-26 NOTE — Telephone Encounter (Signed)
Patient also states that she received her 1st covid-19 vaccine 03/13/19 and the next vaccine is schedule for 04/03/19.

## 2019-03-26 NOTE — Telephone Encounter (Signed)
Patient would like to schedule office visit for tomorrow. Patient is experiencing headaches, dizziness and fatigue. Offered patient virtual appt for 03/27/19 and she declined because she states that she have blood pressure issues and would like to come into the office. Patient is already on the schedule for the 12th of feb 2021. Patient would like a call back to find out what she needs to do regarding this.

## 2019-03-26 NOTE — Telephone Encounter (Signed)
Schedule her for tomorrow in the office as she requests. thanks

## 2019-03-26 NOTE — Telephone Encounter (Signed)
Please advise 

## 2019-03-27 ENCOUNTER — Ambulatory Visit (INDEPENDENT_AMBULATORY_CARE_PROVIDER_SITE_OTHER): Payer: Medicare Other | Admitting: Family Medicine

## 2019-03-27 ENCOUNTER — Encounter: Payer: Self-pay | Admitting: Family Medicine

## 2019-03-27 VITALS — BP 138/62 | HR 63 | Temp 98.0°F | Ht 67.0 in | Wt 131.2 lb

## 2019-03-27 DIAGNOSIS — F419 Anxiety disorder, unspecified: Secondary | ICD-10-CM | POA: Diagnosis not present

## 2019-03-27 DIAGNOSIS — E039 Hypothyroidism, unspecified: Secondary | ICD-10-CM | POA: Diagnosis not present

## 2019-03-27 DIAGNOSIS — F5104 Psychophysiologic insomnia: Secondary | ICD-10-CM

## 2019-03-27 DIAGNOSIS — Z79899 Other long term (current) drug therapy: Secondary | ICD-10-CM

## 2019-03-27 DIAGNOSIS — I10 Essential (primary) hypertension: Secondary | ICD-10-CM

## 2019-03-27 DIAGNOSIS — Z8639 Personal history of other endocrine, nutritional and metabolic disease: Secondary | ICD-10-CM

## 2019-03-27 NOTE — Progress Notes (Signed)
Subjective  CC:  Chief Complaint  Patient presents with  . Headache    just started. coming more frequently.  . Dizziness    checks bp readings at home  . Extremity Weakness    feels "sick" after taking Amlodipine   I spent 30 minutes reviewing office notes and consult notes and labs and CT brain, thyroid studies and heart monitor reports. I spent 40 minutes of face to face consultation with patient today regarding her concerns.  HPI: Ashley [name] is a 79 y.o. female who presents to the office today to address the problems listed above in the chief complaint.  Pt returns due to "feels like hell". She reports that over the last 2-3 weeks she feels dizzy, had 2 headaches, feels weak at times and had some nausea. She brings in daily documentation of her symptoms and blood pressures. She reports that the amlodipine may be a culprit as she has not taken it for the last 2 days and "feels better". Review shows that she has been on it since 2019 but says she only takes it intermittently. She again worries about her thryoid.  Headache: described as frontal and pressure like w/o neck pain, neck stiffness, fever, neuro defecits and resolved with one tylenol. No n/v or photophobia. She does not frequently have headaches.   Head "fog" is worsening. By chart review, she has complained of this and leg weakness for at least 2-3 years. She denies cognitive concerns. She has chronic untreated anxiety.   Generalized weakness and fatigue at times w/o paresis. Will feel drained. Chart shows complaints like these chronically over several years w/o clear diagnosis.   ROS:no GI complaints. No cp. No sob but reports she will awaken and be breathing fast. No palpitations. She had been evaluated in past by endocrine and had normal heart monitor.   Hypertension f/u: last visit dxd with orthostatic hypotension causing dizziness and amlodipine dose was decreased: see her notes. Still with variable blood pressures,  high up to 160s/90s. Feels better off amlodipine. By chart review, could not tolerate atenolol either. No cp or leg edema. Recent labs looked great  H/o thyroiditis and hypothyroidism: she feels this may be worsening. Recent tsh was normal. Dr. Buddy Duty released her from care.   Anxiety: on chronic nighttime benzo's x 2 decades. Has failed zoloft, prozac and lexapro due to side effects in past. Her daughter inquires about counseling. She reported witnessing an event where pt became so stressed about a financial statement she received in the mail that she had to go to bed to lie down because it made her feel so badly.   Review of chart: no CAD, CHF, brain CT 02/2018 showed atrophy, nl holter monitor.   Assessment  1. Essential hypertension   2. Chronic anxiety   3. Anxiety   4. History of Hashimoto thyroiditis 2017   5. Acquired hypothyroidism   6. Psychophysiological insomnia   7. Chronic prescription benzodiazepine use      Plan    Difficult case: she appears well today, her exam is normal and her vital signs are stable. Her recent labs were all normal. She does indeed show orthostatic hypotension with dizziness which is what I think precipitated her feeling badly. She also feels that the amlodipine is worsening her sxs. We will stop the amlodipine x 2 weeks to see if this helps. I've educated her that her blood pressures may increase; I am comfortable allowing for mild hypertension to help alleviate her symptomatic  orthostatic hypotension. i'm concerned she will worry when she sees her bp rise. Her daughter will help reinforce our goal bps of < 160/100. Recheck 2 weeks. There are no sxs of CAD, CHF, arrhythmia or pulmonary disease.   Chronic anxiety: has failed meds. Inquires about therapy  Thyroid is controlled. Reassured. She can see Dr. Sharl Ma again if she'd like  Chronic benzo's: ? Cause of "brain fog".   Brain fog, leg weakness, fatigue: she says these sxs are all worsening. By chart  review, they have been intermittent and ongoing for years . Recommend AWV and MMSE. Pt will schedule.   Education regarding management of these chronic disease states was given. Management strategies discussed on successive visits include dietary and exercise recommendations, goals of achieving and maintaining IBW, and lifestyle modifications aiming for adequate sleep and minimizing stressors.   Follow up: Return for AWV.  No orders of the defined types were placed in this encounter.  No orders of the defined types were placed in this encounter.     BP Readings from Last 3 Encounters:  03/27/19 138/62  03/08/19 (!) 152/70  02/14/19 140/81   Wt Readings from Last 3 Encounters:  03/27/19 131 lb 3.2 oz (59.5 kg)  03/08/19 130 lb 6.4 oz (59.1 kg)  02/14/19 140 lb (63.5 kg)    Lab Results  Component Value Date   CHOL 190 08/15/2017   CHOL 188 06/20/2016   CHOL 194 06/17/2015   Lab Results  Component Value Date   HDL 81.60 08/15/2017   HDL 81.50 06/20/2016   HDL 83.00 06/17/2015   Lab Results  Component Value Date   LDLCALC 93 08/15/2017   LDLCALC 92 06/20/2016   LDLCALC 90 06/17/2015   Lab Results  Component Value Date   TRIG 76.0 08/15/2017   TRIG 70.0 06/20/2016   TRIG 107.0 06/17/2015   Lab Results  Component Value Date   CHOLHDL 2 08/15/2017   CHOLHDL 2 06/20/2016   CHOLHDL 2 06/17/2015   Lab Results  Component Value Date   LDLDIRECT 97.1 05/31/2011   LDLDIRECT 107.3 05/27/2009   Lab Results  Component Value Date   CREATININE 0.83 03/08/2019   BUN 18 03/08/2019   NA 136 03/08/2019   K 4.5 03/08/2019   CL 100 03/08/2019   CO2 28 03/08/2019   Lab Results  Component Value Date   TSH 1.05 03/08/2019     The 10-year ASCVD risk score Denman George DC Jr., et al., 2013) is: 29.1%   Values used to calculate the score:     Age: 46 years     Sex: Female     Is Non-Hispanic African American: No     Diabetic: No     Tobacco smoker: No     Systolic Blood  Pressure: 138 mmHg     Is BP treated: No     HDL Cholesterol: 81.6 mg/dL     Total Cholesterol: 190 mg/dL  I reviewed the patients updated PMH, FH, and SocHx.    Patient Active Problem List   Diagnosis Date Noted  . Acquired hypothyroidism 02/14/2019    Priority: High  . Chronic prescription benzodiazepine use 02/14/2019    Priority: High  . Psychophysiological insomnia 03/26/2018    Priority: High  . Essential hypertension 06/17/2015    Priority: High  . History of Hashimoto thyroiditis 2017 05/03/2007    Priority: High  . Chronic anxiety 05/03/2007    Priority: High  . Osteopenia 08/19/2013    Priority: Medium  .  Atrophic vaginitis 02/14/2019    Priority: Low  . Goiter     Priority: Low  . Vitamin D deficiency 06/17/2014    Priority: Low  . Diverticulosis of large intestine 05/04/2007    Priority: Low  . Dysthymia 06/17/2015    Allergies: Macrobid [nitrofurantoin], Alendronate sodium, Penicillins, Raloxifene, Risedronate sodium, Shellfish-derived products, Citalopram, and Latex  Social History: Patient  reports that she has never smoked. She has never used smokeless tobacco. She reports that she does not drink alcohol or use drugs.  Current Meds  Medication Sig  . aspirin 81 MG tablet Take 81 mg by mouth at bedtime. Takes every other day  . Calcium Carb-Cholecalciferol (CALTRATE 600+D) 600-800 MG-UNIT TABS Take 1 tablet by mouth daily.  . Cholecalciferol (VITAMIN D3) 1.25 MG (50000 UT) TABS Take by mouth.  . docusate sodium (COLACE) 100 MG capsule Take 100 mg by mouth daily.  Marland Kitchen estradiol (ESTRACE) 0.1 MG/GM vaginal cream Place 2 Applicatorfuls vaginally at bedtime.  . Flaxseed, Linseed, (FLAXSEED OIL) OIL Take by mouth.  Marland Kitchen LORazepam (ATIVAN) 1 MG tablet Take 1 tablet (1 mg total) by mouth at bedtime.  . Multiple Vitamins-Minerals (PRESERVISION AREDS PO) Take 1 tablet by mouth daily.  Marland Kitchen SYNTHROID 88 MCG tablet TK 1 T PO QD IN THE MORNING OES  . [DISCONTINUED]  amLODipine (NORVASC) 5 MG tablet Take 0.5 tablets (2.5 mg total) by mouth daily.    Review of Systems: Cardiovascular: negative for chest pain, palpitations, leg swelling, orthopnea Respiratory: negative for SOB, wheezing or persistent cough Gastrointestinal: negative for abdominal pain Genitourinary: negative for dysuria or gross hematuria  Objective  Vitals: BP 138/62 (BP Location: Left Arm, Patient Position: Sitting, Cuff Size: Normal)   Pulse 63   Temp 98 F (36.7 C) (Temporal)   Ht 5\' 7"  (1.702 m)   Wt 131 lb 3.2 oz (59.5 kg)   SpO2 (!) 63%   BMI 20.55 kg/m  General: no acute distress  Psych:  Alert and oriented, anxious mood and affect HEENT:  Normocephalic, atraumatic, supple neck  Cardiovascular:  RRR without murmur. no edema Respiratory:  Good breath sounds bilaterally, CTAB with normal respiratory effort Skin:  Warm, no rashes Neurologic:   Mental status is normal  Commons side effects, risks, benefits, and alternatives for medications and treatment plan prescribed today were discussed, and the patient expressed understanding of the given instructions. Patient is instructed to call or message via MyChart if he/she has any questions or concerns regarding our treatment plan. No barriers to understanding were identified. We discussed Red Flag symptoms and signs in detail. Patient expressed understanding regarding what to do in case of urgent or emergency type symptoms.   Medication list was reconciled, printed and provided to the patient in AVS. Patient instructions and summary information was reviewed with the patient as documented in the AVS. This note was prepared with assistance of Dragon voice recognition software. Occasional wrong-word or sound-a-like substitutions may have occurred due to the inherent limitations of voice recognition software  This visit occurred during the SARS-CoV-2 public health emergency.  Safety protocols were in place, including screening questions  prior to the visit, additional usage of staff PPE, and extensive cleaning of exam room while observing appropriate contact time as indicated for disinfecting solutions.

## 2019-03-27 NOTE — Patient Instructions (Signed)
Please cancel your appointment for the 12th.  Please return in 2 weeks for recheck.  Medicare recommends an Annual Wellness Visit for all patients. Please schedule this to be done with our Nurse Educator, Loma Sousa. This is an informative "talk" visit; it's goals are to ensure that your health care needs are being met and to give you education regarding avoiding falls, ensuring you are not suffering from depression or problems with memory or thinking, and to educate you on Advance Care Planning. It helps me take good care of you!  Please stop the amlodipine. Your blood pressure may elevate.  Call me IF your blood pressure is > 200/100. If it is less, then it is ok to document that for me. Get up slowly.  Take tylenol if you get a headache.   Please call [facility] to schedule an appointment with Dr. Trey Paula to discuss your anxiety; she is a therapist here at our [place] office.  The phone number is: [phone]  If you have any questions or concerns, please don't hesitate to send me a message via MyChart or call the office at [phone]. Thank you for visiting with [place] today! It's our pleasure caring for you.

## 2019-03-29 ENCOUNTER — Ambulatory Visit: Payer: Medicare Other | Admitting: Family Medicine

## 2019-04-01 ENCOUNTER — Ambulatory Visit: Payer: Medicare Other | Admitting: Family Medicine

## 2019-04-02 ENCOUNTER — Other Ambulatory Visit: Payer: Self-pay

## 2019-04-02 DIAGNOSIS — F419 Anxiety disorder, unspecified: Secondary | ICD-10-CM

## 2019-04-02 MED ORDER — LORAZEPAM 1 MG PO TABS: 1.0000 mg | ORAL_TABLET | Freq: Three times a day (TID) | ORAL | 1 refills | Status: DC

## 2019-04-02 NOTE — Telephone Encounter (Signed)
Pt has been on ativan tid for 10 years. I reviewed records. Use had been escalated over last 6 months. I reviewed patient's records from the PMP aware controlled substance registry today.  Will try to decrease to 1mg  tid.  She has f/u appt in 2 weeks scheduled.

## 2019-04-02 NOTE — Telephone Encounter (Signed)
LAST APPOINTMENT DATE: 03/27/2019  NEXT APPOINTMENT DATE:@3 /02/2019   LAST REFILL: 02/14/2019  QTY: 135 tablet

## 2019-04-11 ENCOUNTER — Telehealth: Payer: Self-pay | Admitting: Family Medicine

## 2019-04-11 NOTE — Telephone Encounter (Signed)
Left message on patient's voicemail.

## 2019-04-11 NOTE — Telephone Encounter (Signed)
Please advise 

## 2019-04-11 NOTE — Telephone Encounter (Signed)
I'd like her to have an AWV as we discussed at last visit. This has not yet been scheduled.  In person visit would be helpful so we can check / examine her.

## 2019-04-11 NOTE — Telephone Encounter (Signed)
Pt called asking if she could do a virtual visit on Monday 04/15/2019 instead of in person. Pt asked which Dr. Mardelle Matte would prefer. Please advise.

## 2019-04-15 ENCOUNTER — Ambulatory Visit (INDEPENDENT_AMBULATORY_CARE_PROVIDER_SITE_OTHER): Payer: Medicare Other | Admitting: Family Medicine

## 2019-04-15 ENCOUNTER — Other Ambulatory Visit: Payer: Self-pay

## 2019-04-15 ENCOUNTER — Encounter: Payer: Self-pay | Admitting: Family Medicine

## 2019-04-15 VITALS — BP 142/78 | HR 88 | Temp 98.1°F | Ht 67.0 in | Wt 132.4 lb

## 2019-04-15 DIAGNOSIS — I10 Essential (primary) hypertension: Secondary | ICD-10-CM

## 2019-04-15 DIAGNOSIS — E039 Hypothyroidism, unspecified: Secondary | ICD-10-CM

## 2019-04-15 DIAGNOSIS — Z79899 Other long term (current) drug therapy: Secondary | ICD-10-CM | POA: Diagnosis not present

## 2019-04-15 DIAGNOSIS — F419 Anxiety disorder, unspecified: Secondary | ICD-10-CM | POA: Diagnosis not present

## 2019-04-15 NOTE — Patient Instructions (Signed)
Please return in 3 months for 12 months for your annual complete physical; please come fasting.  Glad you are feeling better. Your blood pressures look good!  If you have any questions or concerns, please don't hesitate to send me a message via MyChart or call the office at [phone]. Thank you for visiting with [place] today! It's our pleasure caring for you.

## 2019-04-15 NOTE — Progress Notes (Signed)
Subjective  CC:  Chief Complaint  Patient presents with  . Hypertension    in for 2 week follow up. Was told to hold amlodipine at last visit. denies any SOB     HPI: Ashley [name] is a 79 y.o. female who presents to the office today to address the problems listed above in the chief complaint.  Hypertension f/u: Control is good . Pt reports she is doing well. taking medications as instructed, no medication side effects noted, no TIAs, no chest pain on exertion, no dyspnea on exertion, no swelling of ankles. Home readings over the last week range from 135/80-150s/90s. Feels much better. No more lightheadedness. Still has occ dizziness.  She denies adverse effects from his BP medications. Compliance with medication is good. See last note  Has [facility] set up for 3/16  Brain fog: want a good MMSE at [facility] to check memory/cog function.   Thyroid was at goal  Overdue for lipids  Assessment  1. Essential hypertension   2. Chronic anxiety   3. Chronic prescription benzodiazepine use   4. Acquired hypothyroidism      Plan    Hypertension f/u: BP control is fairly well controlled. Now at goal and feels better off amlodipine. No antihypertensives for now. Has anxiety component to bp readings.  Anxiety f/u: chronic. Coping. Defers therapy for now  Benzo's  Thyroid is at goal.  Education regarding management of these chronic disease states was given. Management strategies discussed on successive visits include dietary and exercise recommendations, goals of achieving and maintaining IBW, and lifestyle modifications aiming for adequate sleep and minimizing stressors.   Follow up: Return in about 3 months (around 07/16/2019) for complete physical.  No orders of the defined types were placed in this encounter.  No orders of the defined types were placed in this encounter.     BP Readings from Last 3 Encounters:  04/15/19 (!) 142/78  03/27/19 138/62  03/08/19 (!) 152/70   Wt Readings  from Last 3 Encounters:  04/15/19 132 lb 6.4 oz (60.1 kg)  03/27/19 131 lb 3.2 oz (59.5 kg)  03/08/19 130 lb 6.4 oz (59.1 kg)    Lab Results  Component Value Date   CHOL 190 08/15/2017   CHOL 188 06/20/2016   CHOL 194 06/17/2015   Lab Results  Component Value Date   HDL 81.60 08/15/2017   HDL 81.50 06/20/2016   HDL 83.00 06/17/2015   Lab Results  Component Value Date   LDLCALC 93 08/15/2017   LDLCALC 92 06/20/2016   LDLCALC 90 06/17/2015   Lab Results  Component Value Date   TRIG 76.0 08/15/2017   TRIG 70.0 06/20/2016   TRIG 107.0 06/17/2015   Lab Results  Component Value Date   CHOLHDL 2 08/15/2017   CHOLHDL 2 06/20/2016   CHOLHDL 2 06/17/2015   Lab Results  Component Value Date   LDLDIRECT 97.1 05/31/2011   LDLDIRECT 107.3 05/27/2009   Lab Results  Component Value Date   CREATININE 0.83 03/08/2019   BUN 18 03/08/2019   NA 136 03/08/2019   K 4.5 03/08/2019   CL 100 03/08/2019   CO2 28 03/08/2019    The 10-year ASCVD risk score Mikey Bussing DC Jr., et al., 2013) is: 30.5%   Values used to calculate the score:     Age: 26 years     Sex: Female     Is Non-Hispanic African American: No     Diabetic: No     Tobacco smoker:  No     Systolic Blood Pressure: 142 mmHg     Is BP treated: No     HDL Cholesterol: 81.6 mg/dL     Total Cholesterol: 190 mg/dL  I reviewed the patients updated PMH, FH, and SocHx.    Patient Active Problem List   Diagnosis Date Noted  . Acquired hypothyroidism 02/14/2019    Priority: High  . Chronic prescription benzodiazepine use 02/14/2019    Priority: High  . Psychophysiological insomnia 03/26/2018    Priority: High  . Essential hypertension 06/17/2015    Priority: High  . History of Hashimoto thyroiditis 2017 05/03/2007    Priority: High  . Chronic anxiety 05/03/2007    Priority: High  . Osteopenia 08/19/2013    Priority: Medium  . Atrophic vaginitis 02/14/2019    Priority: Low  . Goiter     Priority: Low  . Vitamin D  deficiency 06/17/2014    Priority: Low  . Diverticulosis of large intestine 05/04/2007    Priority: Low  . Dysthymia 06/17/2015    Allergies: Macrobid [nitrofurantoin], Alendronate sodium, Penicillins, Raloxifene, Risedronate sodium, Shellfish-derived products, Citalopram, and Latex  Social History: Patient  reports that she has never smoked. She has never used smokeless tobacco. She reports that she does not drink alcohol or use drugs.  Current Meds  Medication Sig  . aspirin 81 MG tablet Take 81 mg by mouth at bedtime. Takes every other day  . Calcium Carb-Cholecalciferol (CALTRATE 600+D) 600-800 MG-UNIT TABS Take 1 tablet by mouth daily.  . Cholecalciferol (VITAMIN D3) 1.25 MG (50000 UT) TABS Take by mouth.  . docusate sodium (COLACE) 100 MG capsule Take 100 mg by mouth daily.  Marland Kitchen estradiol (ESTRACE) 0.1 MG/GM vaginal cream Place 2 Applicatorfuls vaginally at bedtime.  . Flaxseed, Linseed, (FLAXSEED OIL) OIL Take by mouth.  Marland Kitchen LORazepam (ATIVAN) 1 MG tablet Take 1 tablet (1 mg total) by mouth in the morning, at noon, and at bedtime.  . Multiple Vitamins-Minerals (PRESERVISION AREDS PO) Take 1 tablet by mouth daily.  Marland Kitchen SYNTHROID 88 MCG tablet TK 1 T PO QD IN THE MORNING OES    Review of Systems: Cardiovascular: negative for chest pain, palpitations, leg swelling, orthopnea Respiratory: negative for SOB, wheezing or persistent cough Gastrointestinal: negative for abdominal pain Genitourinary: negative for dysuria or gross hematuria  Objective  Vitals: BP (!) 142/78 Comment: by home reading log, avg  Pulse 88   Temp 98.1 F (36.7 C) (Temporal)   Ht 5\' 7"  (1.702 m)   Wt 132 lb 6.4 oz (60.1 kg)   SpO2 97%   BMI 20.74 kg/m  General: no acute distress  Psych:  Alert and oriented, normal mood and affect HEENT:  Normocephalic, atraumatic, supple neck  Cardiovascular:  RRR without murmur. no edema Respiratory:  Good breath sounds bilaterally, CTAB with normal respiratory  effort Skin:  Warm, no rashes  Commons side effects, risks, benefits, and alternatives for medications and treatment plan prescribed today were discussed, and the patient expressed understanding of the given instructions. Patient is instructed to call or message via MyChart if he/she has any questions or concerns regarding our treatment plan. No barriers to understanding were identified. We discussed Red Flag symptoms and signs in detail. Patient expressed understanding regarding what to do in case of urgent or emergency type symptoms.   Medication list was reconciled, printed and provided to the patient in AVS. Patient instructions and summary information was reviewed with the patient as documented in the AVS. This note was  prepared with assistance of Conservation officer, historic buildings. Occasional wrong-word or sound-a-like substitutions may have occurred due to the inherent limitations of voice recognition software  This visit occurred during the SARS-CoV-2 public health emergency.  Safety protocols were in place, including screening questions prior to the visit, additional usage of staff PPE, and extensive cleaning of exam room while observing appropriate contact time as indicated for disinfecting solutions.

## 2019-04-30 ENCOUNTER — Other Ambulatory Visit: Payer: Self-pay

## 2019-04-30 ENCOUNTER — Ambulatory Visit (INDEPENDENT_AMBULATORY_CARE_PROVIDER_SITE_OTHER): Payer: Medicare Other

## 2019-04-30 VITALS — BP 113/73 | HR 80

## 2019-04-30 DIAGNOSIS — Z0000 Encounter for general adult medical examination without abnormal findings: Secondary | ICD-10-CM

## 2019-04-30 NOTE — Patient Instructions (Signed)
Ashley [name] , Thank you for taking time to come for your Medicare Wellness Visit. I appreciate your ongoing commitment to your health goals. Please review the following plan we discussed and let me know if I can assist you in the future.   Screening recommendations/referrals: Colorectal Screening: up to date; last colonoscopy 06/28/11 (no longer indicated)  Mammogram: No longer indicated  Bone Density: recommended to check for osteoporosis   Vision and Dental Exams: Recommended annual ophthalmology exams for early detection of glaucoma and other disorders of the eye Recommended annual dental exams for proper oral hygiene  Vaccinations: Influenza vaccine: completed 11/08/18 Pneumococcal vaccine: up to date; last 06/17/14 Tdap vaccine: recommended; Please call your insurance company to determine your out of pocket expense. You may receive this vacine at your local pharmacy or Health Dept. Shingles vaccine: You may receive this vaccine at your local pharmacy. (see handout)   Advanced directives: Please bring a copy of your POA (Power of Attorney) and/or Living Will to your next appointment.  Goals: Recommend to drink at least 6-8 8oz glasses of water per day and consume a balanced diet rich in fresh fruits and vegetables.   Next appointment: Please schedule your Annual Wellness Visit with your Nurse Health Advisor in one year.  Preventive Care 80 Years and Older, Female Preventive care refers to lifestyle choices and visits with your health care provider that can promote health and wellness. What does preventive care include?  A yearly physical exam. This is also called an annual well check.  Dental exams once or twice a year.  Routine eye exams. Ask your health care provider how often you should have your eyes checked.  Personal lifestyle choices, including:  Daily care of your teeth and gums.  Regular physical activity.  Eating a healthy diet.  Avoiding tobacco and drug use.   Limiting alcohol use.  Practicing safe sex.  Taking low-dose aspirin every day if recommended by your health care provider.  Taking vitamin and mineral supplements as recommended by your health care provider. What happens during an annual well check? The services and screenings done by your health care provider during your annual well check will depend on your age, overall health, lifestyle risk factors, and family history of disease. Counseling  Your health care provider may ask you questions about your:  Alcohol use.  Tobacco use.  Drug use.  Emotional well-being.  Home and relationship well-being.  Sexual activity.  Eating habits.  History of falls.  Memory and ability to understand (cognition).  Work and work Astronomer.  Reproductive health. Screening  You may have the following tests or measurements:  Height, weight, and BMI.  Blood pressure.  Lipid and cholesterol levels. These may be checked every 5 years, or more frequently if you are over 12 years old.  Skin check.  Lung cancer screening. You may have this screening every year starting at age 33 if you have a 30-pack-year history of smoking and currently smoke or have quit within the past 15 years.  Fecal occult blood test (FOBT) of the stool. You may have this test every year starting at age 78.  Flexible sigmoidoscopy or colonoscopy. You may have a sigmoidoscopy every 5 years or a colonoscopy every 10 years starting at age 80.  Hepatitis C blood test.  Hepatitis B blood test.  Sexually transmitted disease (STD) testing.  Diabetes screening. This is done by checking your blood sugar (glucose) after you have not eaten for a while (fasting). You may  have this done every 1-3 years.  Bone density scan. This is done to screen for osteoporosis. You may have this done starting at age 7.  Mammogram. This may be done every 1-2 years. Talk to your health care provider about how often you should have  regular mammograms. Talk with your health care provider about your test results, treatment options, and if necessary, the need for more tests. Vaccines  Your health care provider may recommend certain vaccines, such as:  Influenza vaccine. This is recommended every year.  Tetanus, diphtheria, and acellular pertussis (Tdap, Td) vaccine. You may need a Td booster every 10 years.  Zoster vaccine. You may need this after age 19.  Pneumococcal 13-valent conjugate (PCV13) vaccine. One dose is recommended after age 33.  Pneumococcal polysaccharide (PPSV23) vaccine. One dose is recommended after age 26. Talk to your health care provider about which screenings and vaccines you need and how often you need them. This information is not intended to replace advice given to you by your health care provider. Make sure you discuss any questions you have with your health care provider. Document Released: 02/27/2015 Document Revised: 10/21/2015 Document Reviewed: 12/02/2014 Elsevier Interactive Patient Education  2017 [employer] Prevention in the Home Falls can cause injuries. They can happen to people of all ages. There are many things you can do to make your home safe and to help prevent falls. What can I do on the outside of my home?  Regularly fix the edges of walkways and driveways and fix any cracks.  Remove anything that might make you trip as you walk through a door, such as a raised step or threshold.  Trim any bushes or trees on the path to your home.  Use bright outdoor lighting.  Clear any walking paths of anything that might make someone trip, such as rocks or tools.  Regularly check to see if handrails are loose or broken. Make sure that both sides of any steps have handrails.  Any raised decks and porches should have guardrails on the edges.  Have any leaves, snow, or ice cleared regularly.  Use sand or salt on walking paths during winter.  Clean up any spills in your  garage right away. This includes oil or grease spills. What can I do in the bathroom?  Use night lights.  Install grab bars by the toilet and in the tub and shower. Do not use towel bars as grab bars.  Use non-skid mats or decals in the tub or shower.  If you need to sit down in the shower, use a plastic, non-slip stool.  Keep the floor dry. Clean up any water that spills on the floor as soon as it happens.  Remove soap buildup in the tub or shower regularly.  Attach bath mats securely with double-sided non-slip rug tape.  Do not have throw rugs and other things on the floor that can make you trip. What can I do in the bedroom?  Use night lights.  Make sure that you have a light by your bed that is easy to reach.  Do not use any sheets or blankets that are too big for your bed. They should not hang down onto the floor.  Have a firm chair that has side arms. You can use this for support while you get dressed.  Do not have throw rugs and other things on the floor that can make you trip. What can I do in the kitchen?  Clean up  any spills right away.  Avoid walking on wet floors.  Keep items that you use a lot in easy-to-reach places.  If you need to reach something above you, use a strong step stool that has a grab bar.  Keep electrical cords out of the way.  Do not use floor polish or wax that makes floors slippery. If you must use wax, use non-skid floor wax.  Do not have throw rugs and other things on the floor that can make you trip. What can I do with my stairs?  Do not leave any items on the stairs.  Make sure that there are handrails on both sides of the stairs and use them. Fix handrails that are broken or loose. Make sure that handrails are as long as the stairways.  Check any carpeting to make sure that it is firmly attached to the stairs. Fix any carpet that is loose or worn.  Avoid having throw rugs at the top or bottom of the stairs. If you do have throw  rugs, attach them to the floor with carpet tape.  Make sure that you have a light switch at the top of the stairs and the bottom of the stairs. If you do not have them, ask someone to add them for you. What else can I do to help prevent falls?  Wear shoes that:  Do not have high heels.  Have rubber bottoms.  Are comfortable and fit you well.  Are closed at the toe. Do not wear sandals.  If you use a stepladder:  Make sure that it is fully opened. Do not climb a closed stepladder.  Make sure that both sides of the stepladder are locked into place.  Ask someone to hold it for you, if possible.  Clearly mark and make sure that you can see:  Any grab bars or handrails.  First and last steps.  Where the edge of each step is.  Use tools that help you move around (mobility aids) if they are needed. These include:  Canes.  Walkers.  Scooters.  Crutches.  Turn on the lights when you go into a dark area. Replace any light bulbs as soon as they burn out.  Set up your furniture so you have a clear path. Avoid moving your furniture around.  If any of your floors are uneven, fix them.  If there are any pets around you, be aware of where they are.  Review your medicines with your doctor. Some medicines can make you feel dizzy. This can increase your chance of falling. Ask your doctor what other things that you can do to help prevent falls. This information is not intended to replace advice given to you by your health care provider. Make sure you discuss any questions you have with your health care provider. Document Released: 11/27/2008 Document Revised: 07/09/2015 Document Reviewed: 03/07/2014 Elsevier Interactive Patient Education  2017 [employer].

## 2019-04-30 NOTE — Progress Notes (Signed)
This visit is being conducted via virtual video connection due to the COVID-19 pandemic. This patient has given me verbal consent via phone to conduct this visit, patient states they are participating from their home address. Some vital signs may be absent or patient reported.   Patient identification: identified by name, DOB, and current address.  Location provider: [facility], Office Persons participating in the virtual visit: Kandis Fantasia LPN, patient, spouseMolly Maduro Mullinax), and Dr. Asencion Partridge   Subjective:   Ashley [name] is a 79 y.o. female who presents for Medicare Annual (Subsequent) preventive examination.  Review of Systems:   Cardiac Risk Factors include: advanced age (>49men, >33 women);hypertension    Objective:     Vitals: BP 113/73 Comment: patient reported  Pulse 80   There is no height or weight on file to calculate BMI.  Advanced Directives 04/30/2019 12/28/2017 12/26/2016 12/26/2016 01/28/2016  Does Patient Have a Medical Advance Directive? Yes Yes - Yes No  Type of Advance Directive Living will;Healthcare Power of Attorney - - - -  Does patient want to make changes to medical advance directive? No - Patient declined - Yes (MAU/Ambulatory/Procedural Areas - Information given) No - Patient declined -  Copy of Healthcare Power of Attorney in Chart? No - copy requested - - - -    Tobacco Social History   Tobacco Use  Smoking Status Never Smoker  Smokeless Tobacco Never Used     Counseling given: Not Answered   Clinical Intake:  Pre-visit preparation completed: Yes  Pain : No/denies pain  Diabetes: No  How often do you need to have someone help you when you read instructions, pamphlets, or other written materials from your doctor or pharmacy?: 1 - Never  Interpreter Needed?: No  Information entered by :: Kandis Fantasia LPN  Past Medical History:  Diagnosis Date  . Anxiety state, unspecified   . Diverticulosis of colon (without mention of  hemorrhage)   . Osteoporosis, unspecified   . Other chest pain   . Other malaise and fatigue   . Personal history of other malignant neoplasm of skin   . Unspecified hemorrhoids without mention of complication   . Unspecified hypothyroidism    Past Surgical History:  Procedure Laterality Date  . COLONOSCOPY    . uterine polyp removal     Family History  Problem Relation Age of Onset  . Ovarian cancer Mother   . Pancreatic cancer Brother   . Diabetes Other   . Prostate cancer Brother   . Breast cancer Maternal Aunt   . Breast cancer Paternal Aunt   . Colon cancer Neg Hx   . Esophageal cancer Neg Hx   . Stomach cancer Neg Hx   . Liver disease Neg Hx    Social History   Socioeconomic History  . Marital status: Married    Spouse name: Molly Maduro  . Number of children: 2  . Years of education: Not on file  . Highest education level: Not on file  Occupational History  . Occupation: Retired  Tobacco Use  . Smoking status: Never Smoker  . Smokeless tobacco: Never Used  Substance and Sexual Activity  . Alcohol use: No  . Drug use: No  . Sexual activity: Not on file  Other Topics Concern  . Not on file  Social History Narrative  . Not on file   Social Determinants of Health   Financial Resource Strain:   . Difficulty of Paying Living Expenses:   Food Insecurity:   .  Worried About Charity fundraiser in the Last Year:   . Arboriculturist in the Last Year:   Transportation Needs:   . Film/video editor (Medical):   Marland Kitchen Lack of Transportation (Non-Medical):   Physical Activity:   . Days of Exercise per Week:   . Minutes of Exercise per Session:   Stress:   . Feeling of Stress :   Social Connections:   . Frequency of Communication with Friends and Family:   . Frequency of Social Gatherings with Friends and Family:   . Attends Religious Services:   . Active Member of Clubs or Organizations:   . Attends Archivist Meetings:   Marland Kitchen Marital Status:      Outpatient Encounter Medications as of 04/30/2019  Medication Sig  . aspirin 81 MG tablet Take 81 mg by mouth at bedtime. Takes every other day  . Calcium Carb-Cholecalciferol (CALTRATE 600+D) 600-800 MG-UNIT TABS Take 1 tablet by mouth daily.  . Cholecalciferol (VITAMIN D3) 1.25 MG (50000 UT) TABS Take by mouth.  . docusate sodium (COLACE) 100 MG capsule Take 100 mg by mouth daily.  Marland Kitchen estradiol (ESTRACE) 0.1 MG/GM vaginal cream Place 2 Applicatorfuls vaginally at bedtime.  . Flaxseed, Linseed, (FLAXSEED OIL) OIL Take by mouth.  Marland Kitchen LORazepam (ATIVAN) 1 MG tablet Take 1 tablet (1 mg total) by mouth in the morning, at noon, and at bedtime.  Marland Kitchen MAGNESIUM PO Take 1 tablet by mouth as needed.  . Multiple Vitamins-Minerals (PRESERVISION AREDS PO) Take 1 tablet by mouth daily.  Marland Kitchen SYNTHROID 88 MCG tablet TK 1 T PO QD IN THE MORNING OES   No facility-administered encounter medications on file as of 04/30/2019.    Activities of Daily Living In your present state of health, do you have any difficulty performing the following activities: 04/30/2019 02/14/2019  Hearing? N N  Vision? N N  Difficulty concentrating or making decisions? N N  Walking or climbing stairs? N N  Dressing or bathing? N N  Doing errands, shopping? N N  Preparing Food and eating ? N -  Using the Toilet? N -  In the past six months, have you accidently leaked urine? N -  Do you have problems with loss of bowel control? N -  Managing your Medications? N -  Managing your Finances? N -  Housekeeping or managing your Housekeeping? N -  Some recent data might be hidden    Patient Care Team: Leamon Arnt, MD as PCP - General (Family Medicine) Delrae Rend, MD as Consulting Physician (Endocrinology) [facility] as Consulting Physician (Dermatology) Luretha Rued as Consulting Physician (Ophthalmology) Rozetta Nunnery, MD as Consulting Physician (Otolaryngology) Maisie Fus, MD as Consulting Physician  (Obstetrics and Gynecology) Marolyn Hammock, DMD as Consulting Physician (Dentistry) Macarthur Critchley, OD as Consulting Physician (Optometry) Armbruster, Carlota Raspberry, MD as Consulting Physician (Gastroenterology)    Assessment:   This is a routine wellness examination for [place].  Exercise Activities and Dietary recommendations Current Exercise Habits: Home exercise routine, Type of exercise: walking, Time (Minutes): 30, Frequency (Times/Week): 3, Weekly Exercise (Minutes/Week): 90  Goals    . Patient Stated     To address any thing in the future  Will get back to walking again. May add on slow        Fall Risk Fall Risk  04/30/2019 02/14/2019 12/28/2017 12/25/2017 12/26/2016  Falls in the past year? 0 0 0 0 No  Number falls in past yr: 0  0 - - -  Injury with Fall? 0 - - 0 -  Follow up Falls evaluation completed;Education provided;Falls prevention discussed - - Falls evaluation completed -   Is the patient's home free of loose throw rugs in walkways, pet beds, electrical cords, etc?   yes      Grab bars in the bathroom? yes      Handrails on the stairs?   yes      Adequate lighting?   yes  Depression Screen PHQ 2/9 Scores 04/30/2019 02/14/2019 03/26/2018 12/28/2017  PHQ - 2 Score 0 0 0 0  PHQ- 9 Score - 1 2 -     Cognitive Function MMSE - Mini Mental State Exam 12/28/2017  Not completed: (No Data)     6CIT Screen 04/30/2019  What Year? 0 points  What month? 0 points  What time? 0 points  Count back from 20 0 points  Months in reverse 0 points  Repeat phrase 0 points  Total Score 0    Immunization History  Administered Date(s) Administered  . H1N1 02/07/2008  . Influenza Inj Mdck Quad Pf 11/17/2017  . Influenza Split 12/04/2010, 11/16/2011  . Influenza Whole 11/14/2008, 11/14/2009, 11/15/2017  . Influenza, High Dose Seasonal PF 11/15/2013, 11/20/2015, 11/25/2016, 11/08/2018  . Influenza,inj,Quad PF,6+ Mos 11/15/2014  . Influenza-Unspecified 10/15/2012, 11/16/2013  .  PFIZER SARS-COV-2 Vaccination 03/13/2019, 04/03/2019  . Pneumococcal Conjugate-13 06/17/2014  . Pneumococcal Polysaccharide-23 05/16/2007  . Tdap 11/14/2004    Qualifies for Shingles Vaccine?Discussed and patient will check with pharmacy for coverage.  Patient education handout provided   Screening Tests Health Maintenance  Topic Date Due  . INFLUENZA VACCINE  Completed  . PNA vac Low Risk Adult  Completed    Cancer Screenings: Lung: Low Dose CT Chest recommended if Age 86-80 years, 30 pack-year currently smoking OR have quit w/in 15years. Patient does not qualify. Breast:  Up to date on Mammogram? Yes   Up to date of Bone Density/Dexa? Yes Colorectal: colonoscopy 06/28/11 (no longer indicated)     Plan:  I have personally reviewed and addressed the Medicare Annual Wellness questionnaire and have noted the following in the patient's chart:  A. Medical and social history B. Use of alcohol, tobacco or illicit drugs  C. Current medications and supplements D. Functional ability and status E.  Nutritional status F.  Physical activity G. Advance directives H. List of other physicians I.  Hospitalizations, surgeries, and ER visits in previous 12 months J.  Vitals K. Screenings such as hearing and vision if needed, cognitive and depression L. Referrals, records requested, and appointments- none   In addition, I have reviewed and discussed with patient certain preventive protocols, quality metrics, and best practice recommendations. A written personalized care plan for preventive services as well as general preventive health recommendations were provided to patient.   Signed,  Kandis Fantasia, LPN  Nurse Health Advisor   Nurse Notes: no additional

## 2019-06-11 ENCOUNTER — Encounter: Payer: Medicare Other | Admitting: Family Medicine

## 2019-07-12 ENCOUNTER — Ambulatory Visit (INDEPENDENT_AMBULATORY_CARE_PROVIDER_SITE_OTHER): Payer: Medicare Other | Admitting: Family Medicine

## 2019-07-12 ENCOUNTER — Encounter: Payer: Self-pay | Admitting: Family Medicine

## 2019-07-12 ENCOUNTER — Other Ambulatory Visit: Payer: Self-pay

## 2019-07-12 VITALS — BP 142/90 | HR 76 | Temp 98.2°F | Ht 66.5 in | Wt 134.0 lb

## 2019-07-12 DIAGNOSIS — E039 Hypothyroidism, unspecified: Secondary | ICD-10-CM

## 2019-07-12 DIAGNOSIS — E559 Vitamin D deficiency, unspecified: Secondary | ICD-10-CM | POA: Diagnosis not present

## 2019-07-12 DIAGNOSIS — Z79899 Other long term (current) drug therapy: Secondary | ICD-10-CM

## 2019-07-12 DIAGNOSIS — F5104 Psychophysiologic insomnia: Secondary | ICD-10-CM | POA: Diagnosis not present

## 2019-07-12 DIAGNOSIS — R202 Paresthesia of skin: Secondary | ICD-10-CM

## 2019-07-12 DIAGNOSIS — I10 Essential (primary) hypertension: Secondary | ICD-10-CM

## 2019-07-12 DIAGNOSIS — F419 Anxiety disorder, unspecified: Secondary | ICD-10-CM

## 2019-07-12 DIAGNOSIS — Z0000 Encounter for general adult medical examination without abnormal findings: Secondary | ICD-10-CM

## 2019-07-12 DIAGNOSIS — M8589 Other specified disorders of bone density and structure, multiple sites: Secondary | ICD-10-CM

## 2019-07-12 DIAGNOSIS — F341 Dysthymic disorder: Secondary | ICD-10-CM

## 2019-07-12 LAB — COMPREHENSIVE METABOLIC PANEL
ALT: 13 U/L (ref 0–35)
AST: 15 U/L (ref 0–37)
Albumin: 4.2 g/dL (ref 3.5–5.2)
Alkaline Phosphatase: 51 U/L (ref 39–117)
BUN: 18 mg/dL (ref 6–23)
CO2: 28 mEq/L (ref 19–32)
Calcium: 9.5 mg/dL (ref 8.4–10.5)
Chloride: 101 mEq/L (ref 96–112)
Creatinine, Ser: 0.7 mg/dL (ref 0.40–1.20)
GFR: 80.48 mL/min (ref >60.00)
Glucose, Bld: 110 mg/dL — ABNORMAL HIGH (ref 70–99)
Potassium: 4.4 mEq/L (ref 3.5–5.1)
Sodium: 137 mEq/L (ref 135–145)
Total Bilirubin: 0.7 mg/dL (ref 0.2–1.2)
Total Protein: 6.6 g/dL (ref 6.0–8.3)

## 2019-07-12 LAB — TSH: TSH: 0.72 u[IU]/mL (ref 0.35–4.50)

## 2019-07-12 LAB — CBC WITH DIFFERENTIAL/PLATELET
Basophils Absolute: 0 10*3/uL (ref 0.0–0.1)
Basophils Relative: 1.2 % (ref 0.0–3.0)
Eosinophils Absolute: 0 10*3/uL (ref 0.0–0.7)
Eosinophils Relative: 1.4 % (ref 0.0–5.0)
HCT: 41.1 % (ref 36.0–46.0)
Hemoglobin: 13.9 g/dL (ref 12.0–15.0)
Lymphocytes Relative: 39.4 % (ref 12.0–46.0)
Lymphs Abs: 1.3 10*3/uL (ref 0.7–4.0)
MCHC: 33.9 g/dL (ref 30.0–36.0)
MCV: 94.1 fl (ref 78.0–100.0)
Monocytes Absolute: 0.3 10*3/uL (ref 0.1–1.0)
Monocytes Relative: 9.3 % (ref 3.0–12.0)
Neutro Abs: 1.6 10*3/uL (ref 1.4–7.7)
Neutrophils Relative %: 48.7 % (ref 43.0–77.0)
Platelets: 165 10*3/uL (ref 150.0–400.0)
RBC: 4.37 Mil/uL (ref 3.87–5.11)
RDW: 13.3 % (ref 11.5–15.5)
WBC: 3.2 10*3/uL — ABNORMAL LOW (ref 4.0–10.5)

## 2019-07-12 LAB — VITAMIN D 25 HYDROXY (VIT D DEFICIENCY, FRACTURES): VITD: 57.51 ng/mL (ref 30.00–100.00)

## 2019-07-12 LAB — LIPID PANEL
Cholesterol: 183 mg/dL (ref 0–200)
HDL: 81.2 mg/dL (ref >39.00)
LDL Cholesterol: 87 mg/dL (ref 0–99)
NonHDL: 101.6
Total CHOL/HDL Ratio: 2
Triglycerides: 72 mg/dL (ref 0.0–149.0)
VLDL: 14.4 mg/dL (ref 0.0–40.0)

## 2019-07-12 LAB — B12 AND FOLATE PANEL
Folate: 21.2 ng/mL (ref >5.9)
Vitamin B-12: 489 pg/mL (ref 211–911)

## 2019-07-12 NOTE — Patient Instructions (Signed)
Please return in 6 months for recheck.   Glad you are doing so well.   If you have any questions or concerns, please don't hesitate to send me a message via MyChart or call the office at [phone]. Thank you for visiting with [place] today! It's our pleasure caring for you.   Preventive Care 12 Years and Older, Female Preventive care refers to lifestyle choices and visits with your health care provider that can promote health and wellness. This includes:  A yearly physical exam. This is also called an annual well check.  Regular dental and eye exams.  Immunizations.  Screening for certain conditions.  Healthy lifestyle choices, such as diet and exercise. What can I expect for my preventive care visit? Physical exam Your health care provider will check:  Height and weight. These may be used to calculate body mass index (BMI), which is a measurement that tells if you are at a healthy weight.  Heart rate and blood pressure.  Your skin for abnormal spots. Counseling Your health care provider may ask you questions about:  Alcohol, tobacco, and drug use.  Emotional well-being.  Home and relationship well-being.  Sexual activity.  Eating habits.  History of falls.  Memory and ability to understand (cognition).  Work and work Statistician.  Pregnancy and menstrual history. What immunizations do I need?  Influenza (flu) vaccine  This is recommended every year. Tetanus, diphtheria, and pertussis (Tdap) vaccine  You may need a Td booster every 10 years. Varicella (chickenpox) vaccine  You may need this vaccine if you have not already been vaccinated. Zoster (shingles) vaccine  You may need this after age 80. Pneumococcal conjugate (PCV13) vaccine  One dose is recommended after age 80. Pneumococcal polysaccharide (PPSV23) vaccine  One dose is recommended after age 80. Measles, mumps, and rubella (MMR) vaccine  You may need at least one dose of MMR if you were  born in 1957 or later. You may also need a second dose. Meningococcal conjugate (MenACWY) vaccine  You may need this if you have certain conditions. Hepatitis A vaccine  You may need this if you have certain conditions or if you travel or work in places where you may be exposed to hepatitis A. Hepatitis B vaccine  You may need this if you have certain conditions or if you travel or work in places where you may be exposed to hepatitis B. Haemophilus influenzae type b (Hib) vaccine  You may need this if you have certain conditions. You may receive vaccines as individual doses or as more than one vaccine together in one shot (combination vaccines). Talk with your health care provider about the risks and benefits of combination vaccines. What tests do I need? Blood tests  Lipid and cholesterol levels. These may be checked every 5 years, or more frequently depending on your overall health.  Hepatitis C test.  Hepatitis B test. Screening  Lung cancer screening. You may have this screening every year starting at age 80 if you have a 30-pack-year history of smoking and currently smoke or have quit within the past 15 years.  Colorectal cancer screening. All adults should have this screening starting at age 80 and continuing until age 72. Your health care provider may recommend screening at age 44 if you are at increased risk. You will have tests every 1-10 years, depending on your results and the type of screening test.  Diabetes screening. This is done by checking your blood sugar (glucose) after you have not eaten for  a while (fasting). You may have this done every 1-3 years.  Mammogram. This may be done every 1-2 years. Talk with your health care provider about how often you should have regular mammograms.  BRCA-related cancer screening. This may be done if you have a family history of breast, ovarian, tubal, or peritoneal cancers. Other tests  Sexually transmitted disease (STD)  testing.  Bone density scan. This is done to screen for osteoporosis. You may have this done starting at age 80. Follow these instructions at home: Eating and drinking  Eat a diet that includes fresh fruits and vegetables, whole grains, lean protein, and low-fat dairy products. Limit your intake of foods with high amounts of sugar, saturated fats, and salt.  Take vitamin and mineral supplements as recommended by your health care provider.  Do not drink alcohol if your health care provider tells you not to drink.  If you drink alcohol: ? Limit how much you have to 0-1 drink a day. ? Be aware of how much alcohol is in your drink. In the [place] one drink equals one 12 oz bottle of beer (355 mL), one 5 oz glass of wine (148 mL), or one 1 oz glass of hard liquor (44 mL). Lifestyle  Take daily care of your teeth and gums.  Stay active. Exercise for at least 30 minutes on 5 or more days each week.  Do not use any products that contain nicotine or tobacco, such as cigarettes, e-cigarettes, and chewing tobacco. If you need help quitting, ask your health care provider.  If you are sexually active, practice safe sex. Use a condom or other form of protection in order to prevent STIs (sexually transmitted infections).  Talk with your health care provider about taking a low-dose aspirin or statin. What's next?  Go to your health care provider once a year for a well check visit.  Ask your health care provider how often you should have your eyes and teeth checked.  Stay up to date on all vaccines. This information is not intended to replace advice given to you by your health care provider. Make sure you discuss any questions you have with your health care provider. Document Revised: 01/25/2018 Document Reviewed: 01/25/2018 Elsevier Patient Education  2020 [employer].

## 2019-07-12 NOTE — Progress Notes (Signed)
Subjective  Chief Complaint  Patient presents with  . Annual Exam    Fasting    HPI: Ashley [name] is a 79 y.o. female who presents to [facility] today for a Female Wellness Visit. She also has the concerns and/or needs as listed above in the chief complaint. These will be addressed in addition to the Health Maintenance Visit.   Wellness Visit: annual visit with health maintenance review and exam without Pap   HM: 79 yo no longer following osteopenia with dexa. imms up to date. Eating well. Remains independent living with husband. No complaints of memory concerns today. Nl 6CIT screen at most recent AWV.  Chronic disease f/u and/or acute problem visit: (deemed necessary to be done in addition to the wellness visit):  H/o HTN: no longer on meds due to orthostatic sxs. Feels well now. Hasn't checked bp much but when she does it is normal. No lightheadedness, cp, palpations, sob or leg edema.   Low thyroid. Energy levels are good. No cold or heat intolerances. On meds daily. Very compliant.  Chronic benzos' for GAD. Sleep is improved  C/o only being able to wear one comfortable pair of tennis shoes that support her. At times legs with feel "heavy or unbalanced". Wears stockings daily for leg support. Will have to lie down and in 10 minutes she feels better. Denies claudication, joint pain, calf pain, falls or pain.   Assessment  1. Annual physical exam   2. Essential hypertension   3. Acquired hypothyroidism   4. Chronic prescription benzodiazepine use   5. Psychophysiological insomnia   6. Chronic anxiety   7. Dysthymia   8. Osteopenia of multiple sites   9. Vitamin D deficiency   10. Paresthesia of both legs      Plan  Female Wellness Visit:  Age appropriate Health Maintenance and Prevention measures were discussed with patient. Included topics are cancer screening recommendations, ways to keep healthy (see AVS) including dietary and exercise  recommendations, regular eye and dental care, use of seat belts, and avoidance of moderate alcohol use and tobacco use.   BMI: discussed patient's BMI and encouraged positive lifestyle modifications to help get to or maintain a target BMI.  HM needs and immunizations were addressed and ordered. See below for orders. See HM and immunization section for updates.  Routine labs and screening tests ordered including cmp, cbc and lipids where appropriate.  Discussed recommendations regarding Vit D and calcium supplementation (see AVS)  Chronic disease management visit and/or acute problem visit:  H/o HTN: stable now. No meds given h/o orthostatic hypotension on low dose amlodipine and multiple med intolerances.   Recheck thyroid  Paresthesias w/ normal neuro and strength exam today. Reassured. Check labs. Suspect mild quad weakness and mild balance issues, multifactorial due to aging. Consider PT for balance and strength training. Pt to consider.   Recheck vitamin levels  Continue benzo. Understands risks/benefits and indications. Anxiety has improved some.   Follow up: 6 months for recheck  Orders Placed This Encounter  Procedures  . CBC with Differential/Platelet  . Comprehensive metabolic panel  . Lipid panel  . TSH  . VITAMIN D 25 Hydroxy (Vit-D Deficiency, Fractures)  . B12 and Folate Panel    Lifestyle: Body mass index is 21.3 kg/m. Wt Readings from Last 3 Encounters:  07/12/19 134 lb (60.8 kg)  04/15/19 132 lb 6.4 oz (60.1 kg)  03/27/19 131 lb 3.2 oz (59.5 kg)  Patient Active Problem List   Diagnosis Date Noted  . Acquired hypothyroidism 02/14/2019    Priority: High    Since age 1   . Chronic prescription benzodiazepine use 02/14/2019    Priority: High    Ativan for years from prior pcps; uses for sleep   . Psychophysiological insomnia 03/26/2018    Priority: High  . Essential hypertension 06/17/2015    Priority: High  . History of Hashimoto thyroiditis  2017 05/03/2007    Priority: High  . Chronic anxiety 05/03/2007    Priority: High    Has failed SSRIs   . Osteopenia 08/19/2013    Priority: Medium    Per GYN; has failed evista and biphosphonates; declines further screens, declines prolia. Will get DEXA 2021 for f/u at Dr. Marcelina Morel' office   . Atrophic vaginitis 02/14/2019    Priority: Low  . Goiter     Priority: Low  . Vitamin D deficiency 06/17/2014    Priority: Low  . Diverticulosis of large intestine 05/04/2007    Priority: Low  . Dysthymia 06/17/2015   Health Maintenance  Topic Date Due  . INFLUENZA VACCINE  09/15/2019  . COVID-19 Vaccine  Completed  . PNA vac Low Risk Adult  Completed   Immunization History  Administered Date(s) Administered  . H1N1 02/07/2008  . Influenza Inj Mdck Quad Pf 11/17/2017  . Influenza Split 12/04/2010, 11/16/2011  . Influenza Whole 11/14/2008, 11/14/2009, 11/15/2017  . Influenza, High Dose Seasonal PF 11/15/2013, 11/20/2015, 11/25/2016, 11/08/2018  . Influenza,inj,Quad PF,6+ Mos 11/15/2014  . Influenza-Unspecified 10/15/2012, 11/16/2013  . PFIZER SARS-COV-2 Vaccination 03/13/2019, 04/03/2019  . Pneumococcal Conjugate-13 06/17/2014  . Pneumococcal Polysaccharide-23 05/16/2007  . Tdap 11/14/2004, 07/06/2019   We updated and reviewed the patient's past history in detail and it is documented below. Allergies: Patient is allergic to macrobid [nitrofurantoin]; alendronate sodium; penicillins; raloxifene; risedronate sodium; shellfish-derived products; citalopram; and latex. Past Medical History Patient  has a past medical history of Anxiety state, unspecified, Diverticulosis of colon (without mention of hemorrhage), Osteoporosis, unspecified, Other chest pain, Other malaise and fatigue, Personal history of other malignant neoplasm of skin, Unspecified hemorrhoids without mention of complication, and Unspecified hypothyroidism. Past Surgical History Patient  has a past surgical history that  includes Colonoscopy and uterine polyp removal. Family History: Patient family history includes Breast cancer in her maternal aunt and paternal aunt; Diabetes in an other family member; Ovarian cancer in her mother; Pancreatic cancer in her brother; Prostate cancer in her brother. Social History:  Patient  reports that she has never smoked. She has never used smokeless tobacco. She reports that she does not drink alcohol or use drugs.  Review of Systems: Constitutional: negative for fever or malaise Ophthalmic: negative for photophobia, double vision or loss of vision Cardiovascular: negative for chest pain, dyspnea on exertion, or new LE swelling Respiratory: negative for SOB or persistent cough Gastrointestinal: negative for abdominal pain, change in bowel habits or melena Genitourinary: negative for dysuria or gross hematuria, no abnormal uterine bleeding or disharge Musculoskeletal: + for new gait disturbance or muscular weakness Integumentary: negative for new or persistent rashes, no breast lumps Neurological: negative for TIA or stroke symptoms Psychiatric: negative for SI or delusions Allergic/Immunologic: negative for hives  Patient Care Team    Relationship Specialty Notifications Start End  Willow Ora, MD PCP - General Family Medicine  02/14/19   Talmage Coin, MD Consulting Physician Endocrinology  09/14/17   [facility] Consulting Physician Dermatology  12/25/17   Ronney Asters  Consulting Physician Ophthalmology  12/25/17   Rozetta Nunnery, MD Consulting Physician Otolaryngology  12/25/17   Maisie Fus, MD Consulting Physician Obstetrics and Gynecology  12/25/17   Marolyn Hammock, DMD Consulting Physician Dentistry  12/25/17   Macarthur Critchley, [place] Physician Optometry  04/30/19   Armbruster, Carlota Raspberry, MD Consulting Physician Gastroenterology  04/30/19     Objective  Vitals: BP (!) 142/90 (BP Location: Left Arm, Patient Position: Sitting, Cuff Size:  Normal)   Pulse 76   Temp 98.2 F (36.8 C) (Temporal)   Ht 5' 6.5" (1.689 m)   Wt 134 lb (60.8 kg)   SpO2 95%   BMI 21.30 kg/m  General:  Well developed, well nourished, no acute distress, looks good today  Psych:  Alert and orientedx3,anxious mood and affect, mild HEENT:  Normocephalic, atraumatic, non-icteric sclera,  supple neck without adenopathy, mass or thyromegaly Cardiovascular:  Normal S1, S2, RRR without gallop, rub or murmur Respiratory:  Good breath sounds bilaterally, CTAB with normal respiratory effort Gastrointestinal: normal bowel sounds, soft, non-tender, no noted masses. No HSM MSK: no deformities, contusions. Joints are without erythema or swelling.  Skin:  Warm, no rashes or suspicious lesions noted Neurologic:    Mental status is normal. CN 2-11 are normal. Gross motor and sensory exams are normal. Normal gait. Mild dysequilibrium with H to T walking, neg romberg, No tremor MSK: 5/5 B LE strength throughout    Commons side effects, risks, benefits, and alternatives for medications and treatment plan prescribed today were discussed, and the patient expressed understanding of the given instructions. Patient is instructed to call or message via MyChart if he/she has any questions or concerns regarding our treatment plan. No barriers to understanding were identified. We discussed Red Flag symptoms and signs in detail. Patient expressed understanding regarding what to do in case of urgent or emergency type symptoms.   Medication list was reconciled, printed and provided to the patient in AVS. Patient instructions and summary information was reviewed with the patient as documented in the AVS. This note was prepared with assistance of Dragon voice recognition software. Occasional wrong-word or sound-a-like substitutions may have occurred due to the inherent limitations of voice recognition software  This visit occurred during the SARS-CoV-2 public health emergency.  Safety  protocols were in place, including screening questions prior to the visit, additional usage of staff PPE, and extensive cleaning of exam room while observing appropriate contact time as indicated for disinfecting solutions.

## 2019-07-22 ENCOUNTER — Telehealth: Payer: Self-pay | Admitting: Family Medicine

## 2019-07-22 NOTE — Telephone Encounter (Signed)
Pt called asking if Dr. Mardelle Matte could clarify the dosage of Vitamin B6 she needs to take. Pt states she can only find 100 mg in drug store.  Please advise.

## 2019-07-22 NOTE — Telephone Encounter (Signed)
Please advise 

## 2019-07-22 NOTE — Telephone Encounter (Signed)
Patient notified

## 2019-07-22 NOTE — Telephone Encounter (Signed)
100mg  twice a day. thanks

## 2019-08-13 LAB — HM DEXA SCAN

## 2019-08-21 ENCOUNTER — Ambulatory Visit: Payer: Medicare Other | Admitting: Podiatry

## 2019-08-21 ENCOUNTER — Other Ambulatory Visit: Payer: Self-pay

## 2019-08-21 ENCOUNTER — Encounter: Payer: Self-pay | Admitting: Podiatry

## 2019-08-21 DIAGNOSIS — M79676 Pain in unspecified toe(s): Secondary | ICD-10-CM | POA: Diagnosis not present

## 2019-08-21 DIAGNOSIS — L603 Nail dystrophy: Secondary | ICD-10-CM

## 2019-08-21 DIAGNOSIS — B351 Tinea unguium: Secondary | ICD-10-CM | POA: Diagnosis not present

## 2019-08-21 NOTE — Progress Notes (Signed)
   HPI: 79 y.o. female presenting today as a new patient for evaluation of fungal nails to the bilateral great toes. Patient states that over the past year the nails have gotten worse and have become thicker. She has been applying vitamin E oil with minimal improvement. They are very thick and tender with shoe gear. She presents for further treatment evaluation  Past Medical History:  Diagnosis Date  . Anxiety state, unspecified   . Diverticulosis of colon (without mention of hemorrhage)   . Osteoporosis, unspecified   . Other chest pain   . Other malaise and fatigue   . Personal history of other malignant neoplasm of skin   . Unspecified hemorrhoids without mention of complication   . Unspecified hypothyroidism      Physical Exam: General: The patient is alert and oriented x3 in no acute distress.  Dermatology: Skin is warm, dry and supple bilateral lower extremities. Negative for open lesions or macerations. Hyperkeratotic dystrophic nails noted to the bilateral great toes with thickening and discoloration  Vascular: Palpable pedal pulses bilaterally. No edema or erythema noted. Capillary refill within normal limits.  Neurological: Epicritic and protective threshold grossly intact bilaterally.   Musculoskeletal Exam: Range of motion within normal limits to all pedal and ankle joints bilateral. Muscle strength 5/5 in all groups bilateral.   Assessment: 1. Pain due to onychomycosis of toenail bilateral great toes Two. Dystrophic nails bilateral great toes   Plan of Care:  1. Patient evaluated.  2. Today we discussed different treatment options including oral, topical, and laser antifungal treatment modalities. We discussed the benefits and disadvantages of each. The patient would like to forego any oral antifungal medication. She has tried topical in the past with no improvement. 3. Today we set up an appointment with our nurse for laser treatment 4. Return to clinic for laser  treatment recommended monthly x6 months      Felecia Shelling, DPM [facility]  Dr. Felecia Shelling, DPM    [address] [place], [place] [address]                Office [phone]  Fax [phone]

## 2019-08-22 ENCOUNTER — Encounter: Payer: Self-pay | Admitting: Family Medicine

## 2019-09-13 ENCOUNTER — Other Ambulatory Visit: Payer: Self-pay | Admitting: Family Medicine

## 2019-09-13 DIAGNOSIS — F419 Anxiety disorder, unspecified: Secondary | ICD-10-CM

## 2019-09-23 ENCOUNTER — Other Ambulatory Visit: Payer: Medicare Other

## 2019-11-20 ENCOUNTER — Telehealth: Payer: Self-pay

## 2019-11-20 NOTE — Telephone Encounter (Signed)
Called and spoke with pt and advised ok for her to get high dose, and to wait 14 days after that to get her COVID booster.

## 2019-11-20 NOTE — Telephone Encounter (Signed)
Patient is calling in this morning asking if Dr.Andy recommends her to get the booster and flu shot.

## 2019-11-20 NOTE — Telephone Encounter (Signed)
Called and talked with pt and ok for her to get the shots, wait 14 days after getting one before getting the other.

## 2019-11-20 NOTE — Telephone Encounter (Signed)
Patient is also calling in stating that last year she received the low dose of the flu shot because of COVID, but is wondering if she would be okay to get the high dose even with her muscle in the L arm bothering her.

## 2019-12-08 ENCOUNTER — Other Ambulatory Visit: Payer: Self-pay | Admitting: Family Medicine

## 2019-12-14 ENCOUNTER — Ambulatory Visit: Payer: Medicare Other

## 2020-01-13 ENCOUNTER — Ambulatory Visit: Payer: Medicare Other | Admitting: Family Medicine

## 2020-01-24 ENCOUNTER — Telehealth: Payer: Self-pay

## 2020-01-24 ENCOUNTER — Other Ambulatory Visit: Payer: Self-pay

## 2020-01-24 MED ORDER — SYNTHROID 88 MCG PO TABS: ORAL_TABLET | ORAL | 3 refills | Status: DC

## 2020-01-24 NOTE — Telephone Encounter (Signed)
..   LAST APPOINTMENT DATE: 12/08/2019   NEXT APPOINTMENT DATE:@1 /08/2020  MEDICATION:SYNTHROID 88 MCG tablet    PHARMACY:[facility] - [place], [place] - [address], [address]  *

## 2020-01-24 NOTE — Telephone Encounter (Signed)
Refill sent to pharmacy.   

## 2020-02-21 ENCOUNTER — Telehealth: Payer: Medicare Other | Admitting: Family Medicine

## 2020-02-25 ENCOUNTER — Other Ambulatory Visit: Payer: Self-pay

## 2020-02-25 ENCOUNTER — Telehealth (INDEPENDENT_AMBULATORY_CARE_PROVIDER_SITE_OTHER): Payer: Medicare Other | Admitting: Family Medicine

## 2020-02-25 ENCOUNTER — Encounter: Payer: Self-pay | Admitting: Family Medicine

## 2020-02-25 DIAGNOSIS — M8589 Other specified disorders of bone density and structure, multiple sites: Secondary | ICD-10-CM

## 2020-02-25 DIAGNOSIS — F5104 Psychophysiologic insomnia: Secondary | ICD-10-CM

## 2020-02-25 DIAGNOSIS — I951 Orthostatic hypotension: Secondary | ICD-10-CM | POA: Diagnosis not present

## 2020-02-25 DIAGNOSIS — Z79899 Other long term (current) drug therapy: Secondary | ICD-10-CM | POA: Diagnosis not present

## 2020-02-25 DIAGNOSIS — I10 Essential (primary) hypertension: Secondary | ICD-10-CM

## 2020-02-25 DIAGNOSIS — F419 Anxiety disorder, unspecified: Secondary | ICD-10-CM

## 2020-02-25 DIAGNOSIS — E039 Hypothyroidism, unspecified: Secondary | ICD-10-CM

## 2020-02-25 NOTE — Progress Notes (Signed)
Virtual Visit via Video Note  Subjective  CC:  Chief Complaint  Patient presents with  . Hypertension  . Hypothyroidism     I connected with Ashley [name] on 02/25/20 at 11:30 AM EST by a video enabled telemedicine application and verified that I am speaking with the correct person using two identifiers. Location patient: Home Location provider: [facility] Persons participating in the virtual visit: Ashley [name], Ashley Ora, MD Adela Glimpse, CMA  I discussed the limitations of evaluation and management by telemedicine and the availability of in person appointments. The patient expressed understanding and agreed to proceed. HPI: Ashley [name] is a 80 y.o. female who was contacted today to address the problems listed above in the chief complaint/HTN f/u:  . 51-month follow-up on chronic medical problems: Patient is doing very well overall.  Unfortunately she broke her ankle since I have seen her last however with physical therapy and a walking boot she is healed well.  She is happy about physical therapy.  Feels her legs are now stronger.  This is also helped decrease her symptoms of leg weakness with walking and heaviness in the legs.  See last note.  She feels that unless she does her exercises regularly those feelings will come back. . History of hypertension autonomic orthostatic hypotension: No longer symptomatic.  Not on blood pressure medications.  Feels well.  No chest pain, shortness of breath or lower extremity edema. . Chronic anxiety and insomnia: Using lorazepam 1 mg nightly for sleep.  No recent falls.  Anxiety is much improved.  Has better insight and thus copes better overall.  No panic symptoms.  No cognitive concerns. . Hypothyroidism: Most recent TSH was normal.  Takes medications as directed.  Compliance is good.  No symptoms of high or low thyroid. . Osteopenia: Reports she had a bone density with her GYN in June.  He reported that it  was fine and nothing else is needed.  She will bring me the report to her next physical.  She has declined further treatments in the past  BP Readings from Last 3 Encounters:  07/12/19 (!) 142/90  04/30/19 113/73  04/15/19 (!) 142/78   Wt Readings from Last 3 Encounters:  07/12/19 134 lb (60.8 kg)  04/15/19 132 lb 6.4 oz (60.1 kg)  03/27/19 131 lb 3.2 oz (59.5 kg)    Lab Results  Component Value Date   CHOL 183 07/12/2019   CHOL 190 08/15/2017   CHOL 188 06/20/2016   Lab Results  Component Value Date   HDL 81.20 07/12/2019   HDL 81.60 08/15/2017   HDL 81.50 06/20/2016   Lab Results  Component Value Date   LDLCALC 87 07/12/2019   LDLCALC 93 08/15/2017   LDLCALC 92 06/20/2016   Lab Results  Component Value Date   TRIG 72.0 07/12/2019   TRIG 76.0 08/15/2017   TRIG 70.0 06/20/2016   Lab Results  Component Value Date   CHOLHDL 2 07/12/2019   CHOLHDL 2 08/15/2017   CHOLHDL 2 06/20/2016   Lab Results  Component Value Date   LDLDIRECT 97.1 05/31/2011   LDLDIRECT 107.3 05/27/2009   Lab Results  Component Value Date   CREATININE 0.70 07/12/2019   BUN 18 07/12/2019   NA 137 07/12/2019   K 4.4 07/12/2019   CL 101 07/12/2019   CO2 28 07/12/2019    The ASCVD Risk score (Goff DC Jr., et al., 2013) failed to  calculate for the following reasons:   The 2013 ASCVD risk score is only valid for ages 41 to 76  Assessment  1. Essential hypertension   2. Autonomic orthostatic hypotension   3. Psychophysiological insomnia   4. Chronic prescription benzodiazepine use   5. Acquired hypothyroidism   6. Anxiety      Plan   Hypertension f/u: No longer on medications.  History of orthostatic hypotension.  No longer symptomatic.  Insomnia and anxiety are well controlled with nighttime lorazepam.  Understands risk versus benefits.  Long-term user.  Monitor.  Unlikely to wean further.  Hypothyroidism is clinically controlled.  Osteopenia: We will get report from GYN.   Continue strengthening exercises, calcium and vitamin D supplementation  I discussed the assessment and treatment plan with the patient. The patient was provided an opportunity to ask questions and all were answered. The patient agreed with the plan and demonstrated an understanding of the instructions.   The patient was advised to call back or seek an in-person evaluation if the symptoms worsen or if the condition fails to improve as anticipated. Follow up: May for complete physical Visit date not found  No orders of the defined types were placed in this encounter.     I reviewed the patients updated PMH, FH, and SocHx.    Patient Active Problem List   Diagnosis Date Noted  . Acquired hypothyroidism 02/14/2019    Priority: High  . Chronic prescription benzodiazepine use 02/14/2019    Priority: High  . Psychophysiological insomnia 03/26/2018    Priority: High  . Essential hypertension 06/17/2015    Priority: High  . History of Hashimoto thyroiditis 2017 05/03/2007    Priority: High  . Chronic anxiety 05/03/2007    Priority: High  . Osteopenia 08/19/2013    Priority: Medium  . Atrophic vaginitis 02/14/2019    Priority: Low  . Goiter     Priority: Low  . Vitamin D deficiency 06/17/2014    Priority: Low  . Diverticulosis of large intestine 05/04/2007    Priority: Low  . Autonomic orthostatic hypotension 02/25/2020  . Dysthymia 06/17/2015   Current Meds  Medication Sig  . aspirin 81 MG tablet Take 81 mg by mouth at bedtime. Takes every other day  . Calcium Carb-Cholecalciferol 600-800 MG-UNIT TABS Take 1 tablet by mouth daily.  Marland Kitchen docusate sodium (COLACE) 100 MG capsule Take 100 mg by mouth daily.  Marland Kitchen estradiol (ESTRACE) 0.1 MG/GM vaginal cream Place 2 Applicatorfuls vaginally at bedtime as needed.  . Flaxseed, Linseed, (FLAXSEED OIL) OIL Take by mouth.  Marland Kitchen MAGNESIUM PO Take 1 tablet by mouth as needed.  . Multiple Vitamins-Minerals (PRESERVISION AREDS PO) Take 1 tablet by  mouth daily.  Marland Kitchen SYNTHROID 88 MCG tablet TK 1 T PO QD IN THE MORNING OES  . Vitamin D, Cholecalciferol, 50 MCG (2000 UT) CAPS Take 1 capsule by mouth daily.  . [DISCONTINUED] LORazepam (ATIVAN) 1 MG tablet TAKE 1 TALET BY MOUTH IN THE MORNING, AT NOON AND AT BEDTIME    Allergies: Patient is allergic to macrobid [nitrofurantoin], alendronate sodium, penicillins, raloxifene, risedronate sodium, shellfish-derived products, citalopram, and latex. Family History: Patient family history includes Breast cancer in her maternal aunt and paternal aunt; Diabetes in an other family member; Ovarian cancer in her mother; Pancreatic cancer in her brother; Prostate cancer in her brother. Social History:  Patient  reports that she has never smoked. She has never used smokeless tobacco. She reports that she does not drink alcohol and does not  use drugs.  Review of Systems: Constitutional: Negative for fever malaise or anorexia Cardiovascular: negative for chest pain Respiratory: negative for SOB or persistent cough Gastrointestinal: negative for abdominal pain  OBJECTIVE Vitals: There were no vitals taken for this visit. General: no acute distress , A&Ox3  Ashley Ora, MD

## 2020-02-25 NOTE — Patient Instructions (Addendum)
Please return in may 2022 for your annual complete physical; please come fasting.  If you have any questions or concerns, please don't hesitate to send me a message via MyChart or call the office at [phone]. Thank you for visiting with [place] today! It's our pleasure caring for you.

## 2020-04-01 ENCOUNTER — Other Ambulatory Visit: Payer: Self-pay | Admitting: Family Medicine

## 2020-04-01 DIAGNOSIS — F419 Anxiety disorder, unspecified: Secondary | ICD-10-CM

## 2020-04-01 NOTE — Telephone Encounter (Signed)
Last refill: 02/25/19 #90, 3  Last OV: 02/25/19 dx. HTN, anxiety   Pharmacy requesting new script

## 2020-04-02 ENCOUNTER — Other Ambulatory Visit: Payer: Self-pay

## 2020-04-02 MED ORDER — SYNTHROID 88 MCG PO TABS: ORAL_TABLET | ORAL | 3 refills | Status: DC

## 2020-05-06 IMAGING — CT CT HEAD W/O CM
4 series · 16 of 47 positions shown, 18 images · non-contrast
Comparison: None.

CLINICAL DATA: Ct head wo, Pt here with c/o htn , pt has history of
same , pt took her meds this morning , s/bp 186 at home

EXAM:
CT HEAD WITHOUT CONTRAST
TECHNIQUE: Contiguous axial images were obtained from the base of the skull
through the vertex without intravenous contrast.

[Series 3: head wo · axial · 0.43mm/px · z∈[-148,-18]mm · 7 of 36 slices shown, 9 images]
[im 5/36  brain]
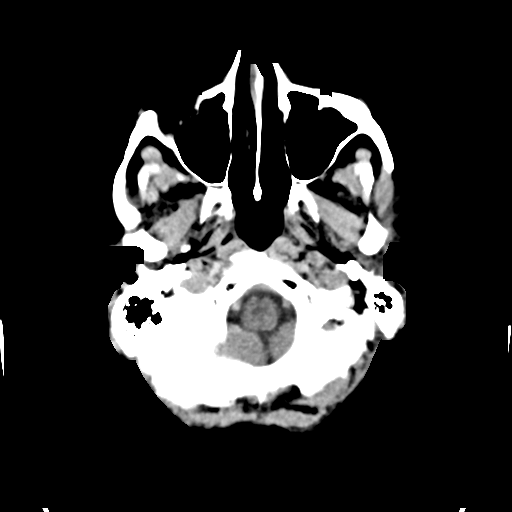
[im 5/36  bone]
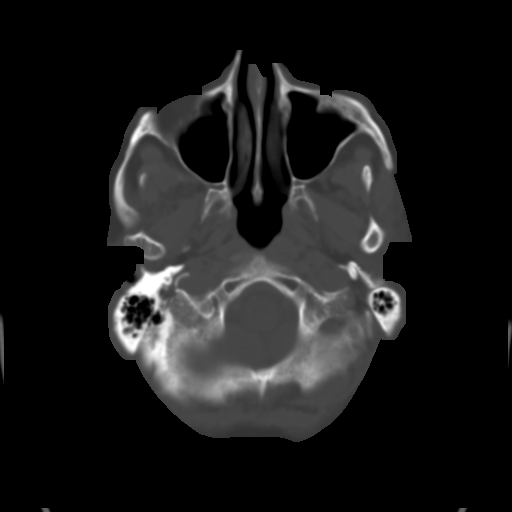
[im 9/36  brain]
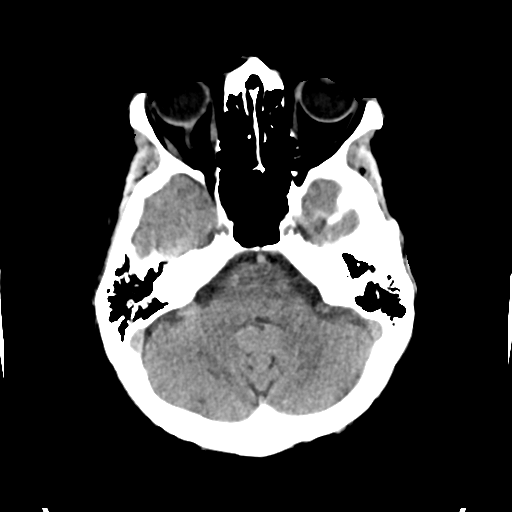
[im 14/36  brain]
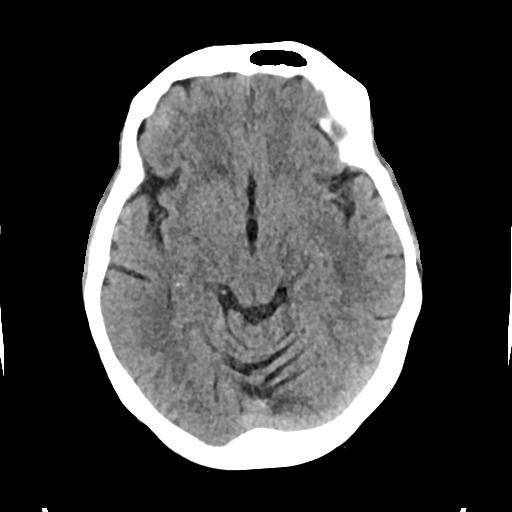
[im 18/36  brain]
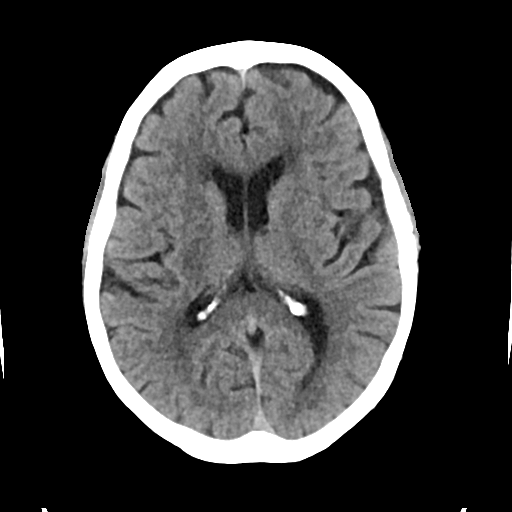
[im 22/36  brain]
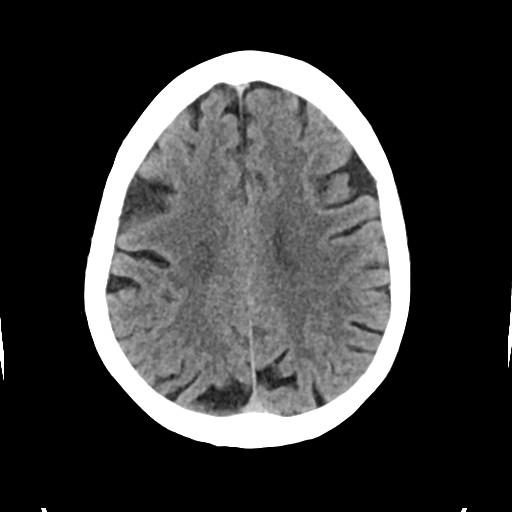
[im 22/36  bone]
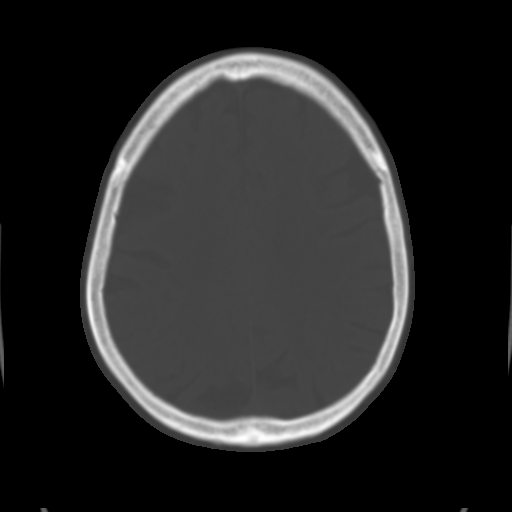
[im 27/36  brain]
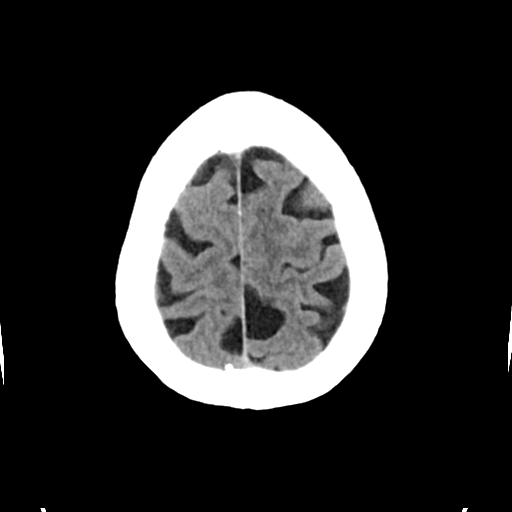
[im 31/36  brain]
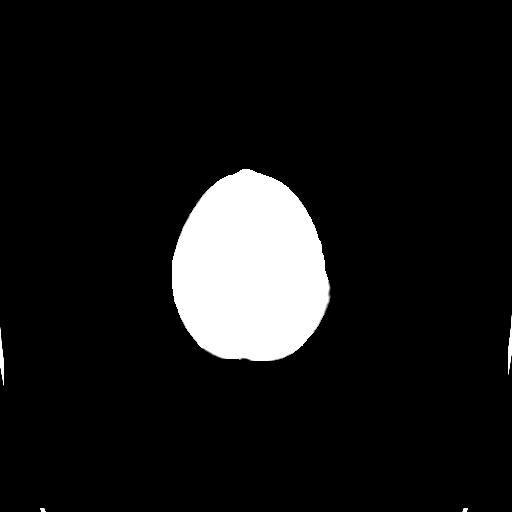

[Series 4: head bone · axial · 0.43mm/px · z∈[-152,-116]mm · 3 of 88 slices shown]
[im 9/88  bone]
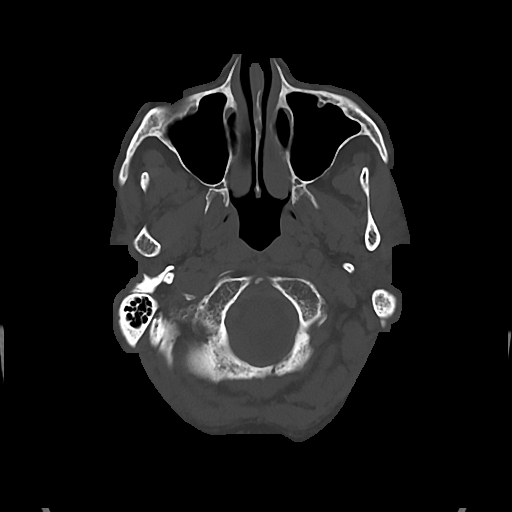
[im 18/88  bone]
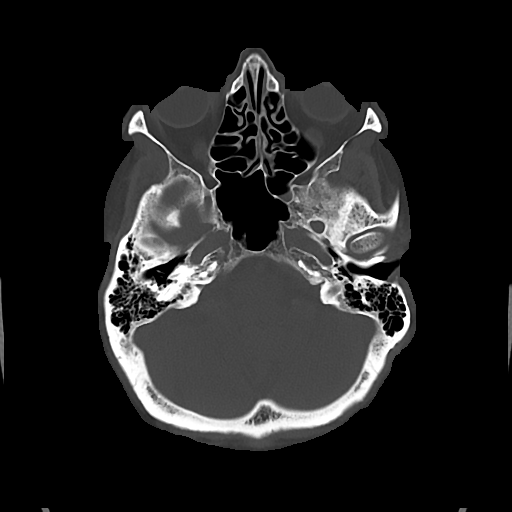
[im 27/88  bone]
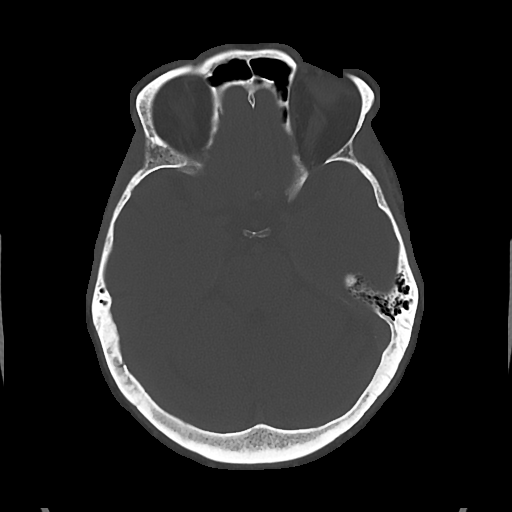

[Series 5: cor soft · coronal · 0.34mm/px · 3 of 72 slices shown]
[im 24/72  brain]
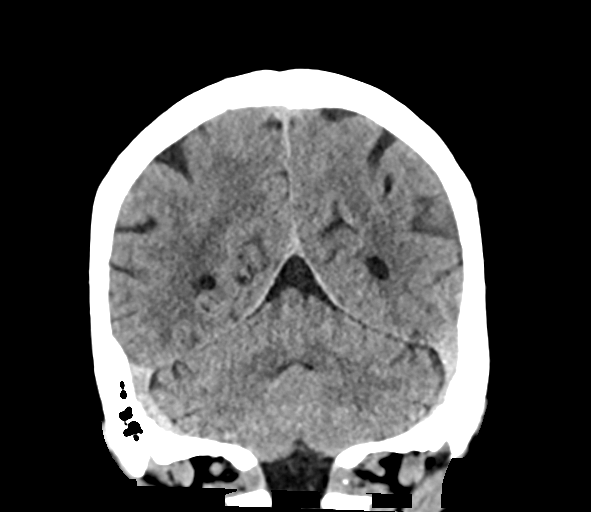
[im 32/72  brain]
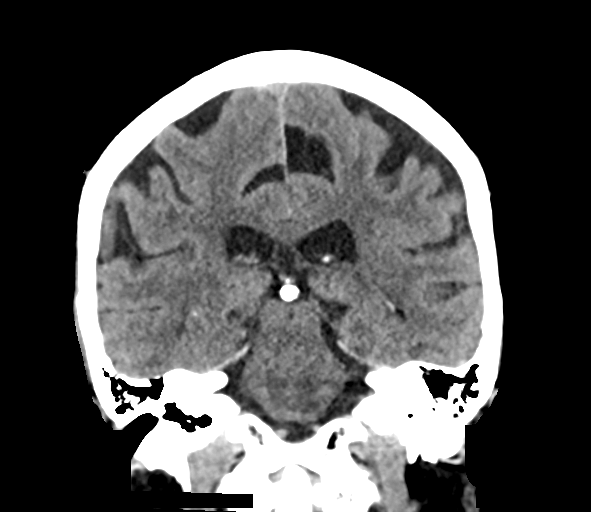
[im 40/72  brain]
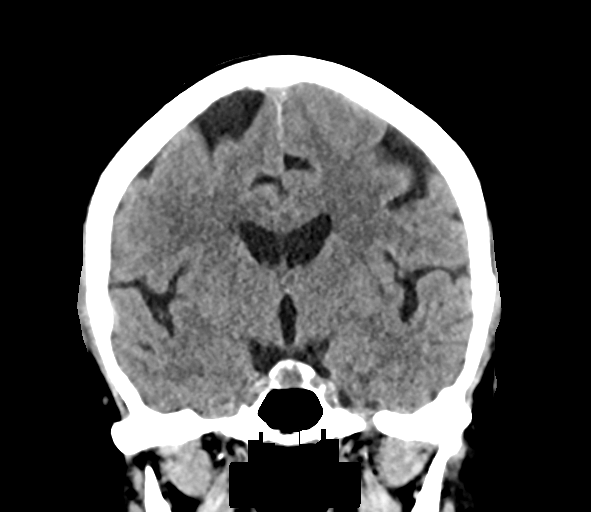

[Series 6: sag soft · sagittal · 0.34mm/px · 3 of 62 slices shown]
[im 21/62  brain]
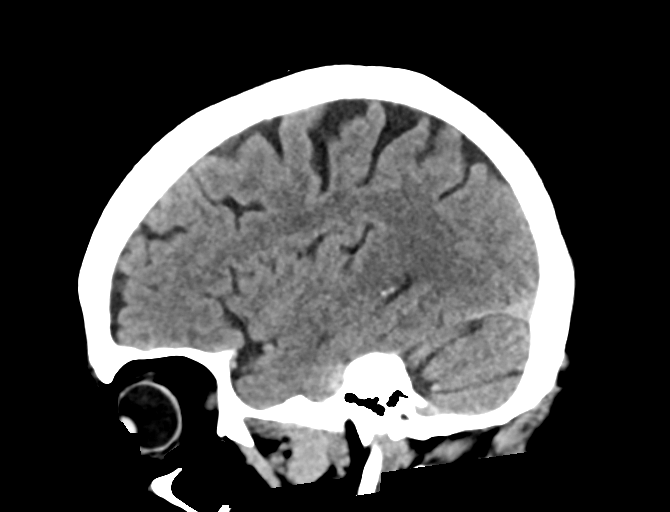
[im 31/62  brain]
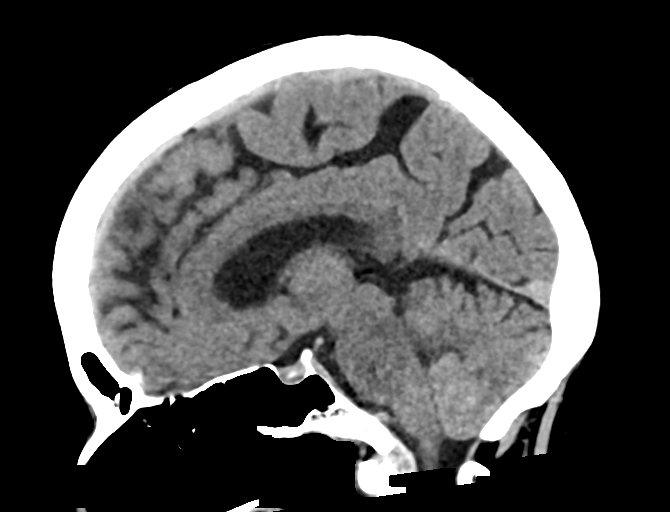
[im 41/62  brain]
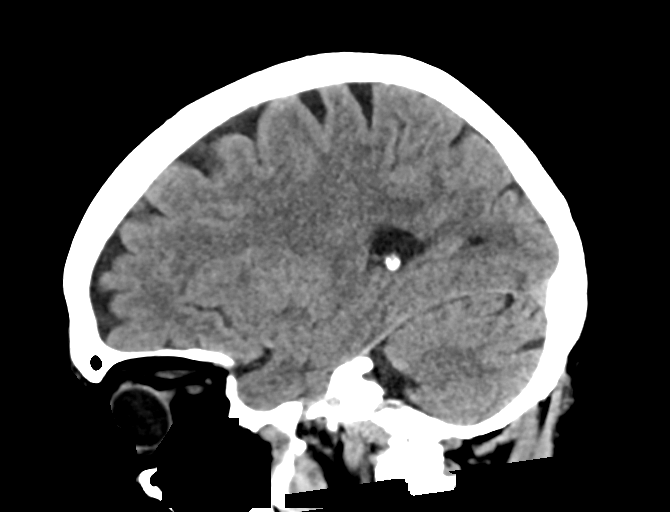

[16 of 47 positions shown; findings below may reference images not displayed]

FINDINGS: Brain: Diffuse parenchymal atrophy. Patchy areas of hypoattenuation
in deep and periventricular white matter bilaterally. Negative for
acute intracranial hemorrhage, mass lesion, acute infarction,
midline shift, or mass-effect. Acute infarct may be inapparent on
noncontrast CT. Ventricles and sulci symmetric.

Vascular: Atherosclerotic and physiologic intracranial
calcifications.

Skull: Normal. Negative for fracture or focal lesion.

Sinuses/Orbits: No acute finding.

Other: None.
IMPRESSION: 1. Negative for bleed or other acute intracranial process.
2. Atrophy and nonspecific white matter changes.

## 2020-05-22 ENCOUNTER — Telehealth: Payer: Self-pay | Admitting: Family Medicine

## 2020-05-22 NOTE — Telephone Encounter (Signed)
Left message for patient to call back and schedule Medicare Annual Wellness Visit (AWV) either virtually OR in office.   Last AWV 04/30/19; please schedule at anytime with [facility] at [facility].  This should be a 45 minute visit.   

## 2020-06-19 IMAGING — DX DG ABDOMEN 2V
2 series · 2 of 2 positions shown · non-contrast
Comparison: 10/23/2012

CLINICAL DATA: Abdominal pain with intermittent diarrhea for 10
days. History of diverticulosis.

EXAM:
ABDOMEN - 2 VIEW

[kub ap (1 of 2)]
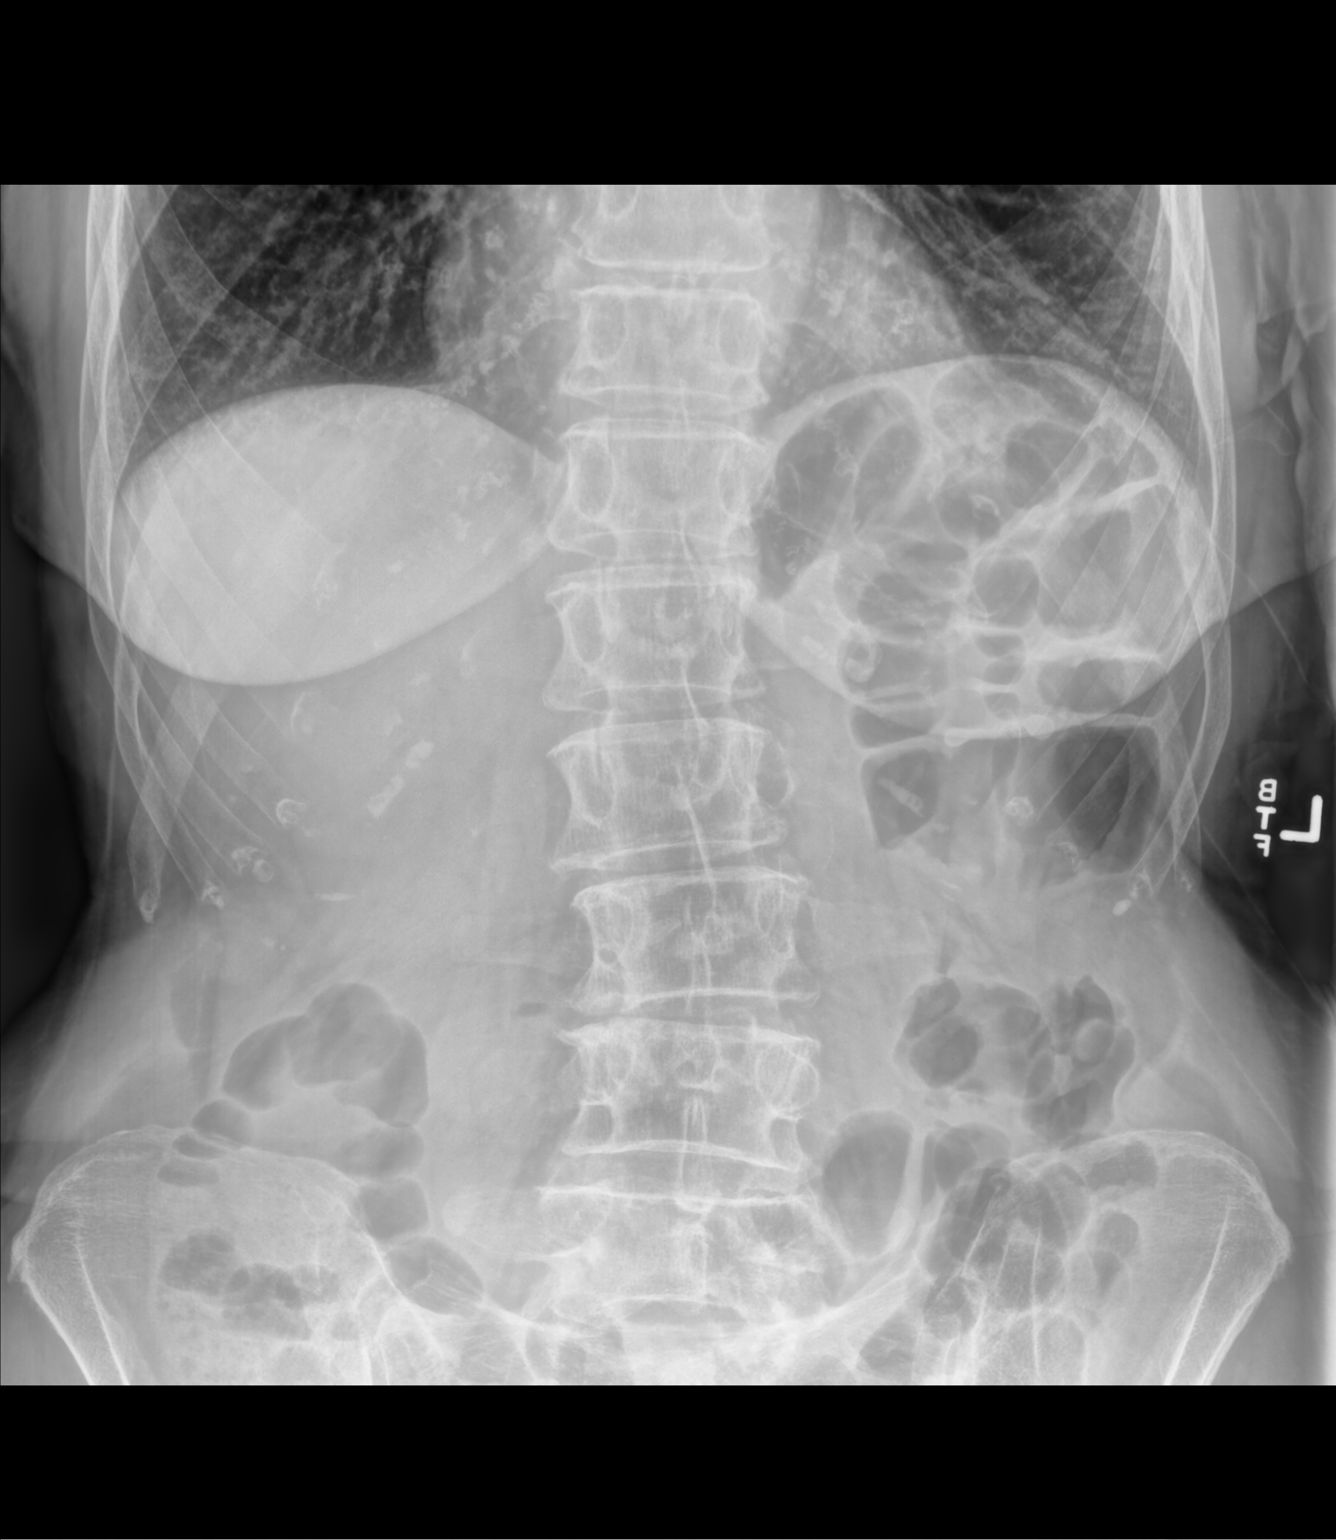

[kub ap (2 of 2)]
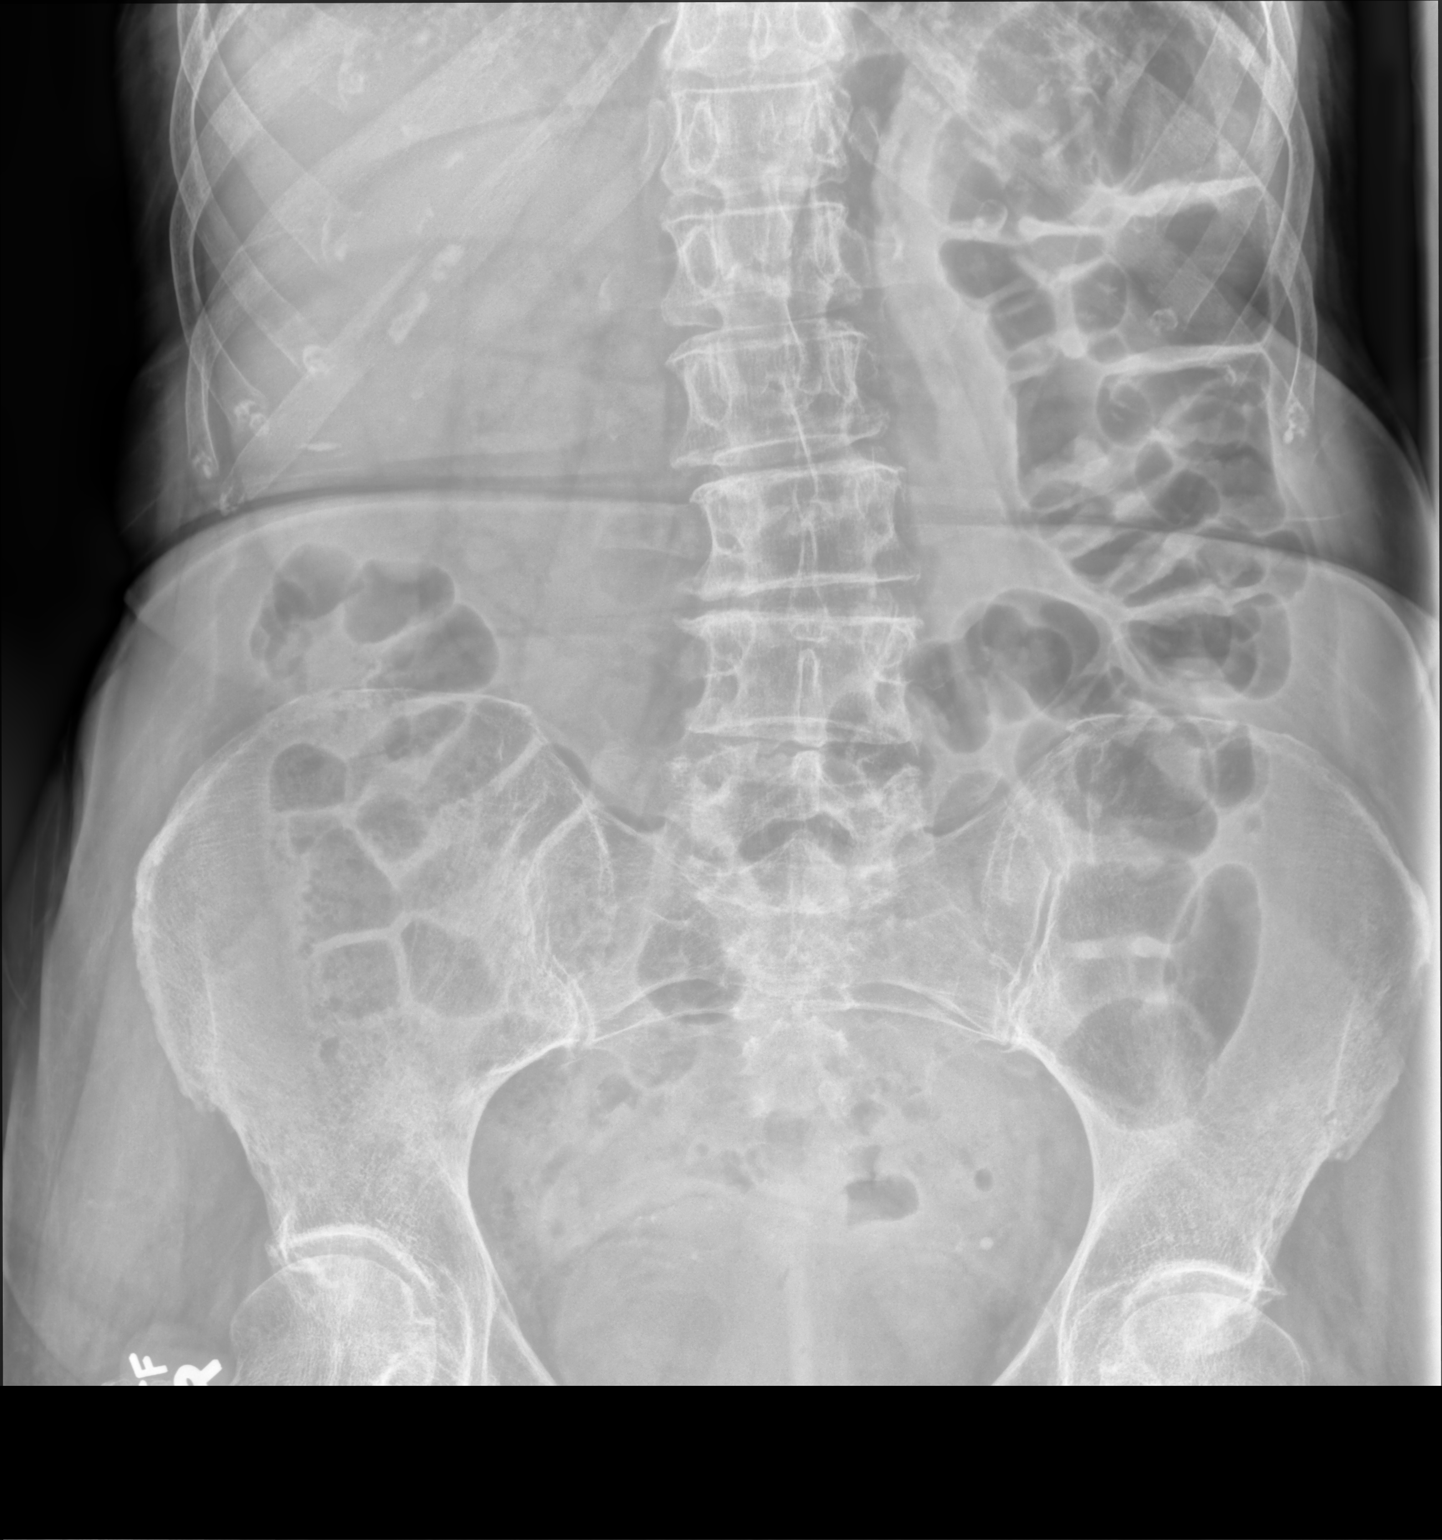

[2 of 2 positions shown; findings below may reference images not displayed]

FINDINGS: Normal bowel gas pattern without evidence of obstruction,
generalized adynamic ileus or free air.

No evidence of renal or ureteral stones. Soft tissues are
unremarkable.

No acute skeletal abnormality.
IMPRESSION: 1. No acute findings. No evidence of bowel obstruction, generalized
adynamic ileus or free air.

## 2020-07-29 ENCOUNTER — Encounter: Payer: Medicare Other | Admitting: Family Medicine

## 2020-08-03 ENCOUNTER — Encounter: Payer: Self-pay | Admitting: Family Medicine

## 2020-08-03 ENCOUNTER — Ambulatory Visit (INDEPENDENT_AMBULATORY_CARE_PROVIDER_SITE_OTHER): Payer: Medicare Other | Admitting: Family Medicine

## 2020-08-03 ENCOUNTER — Other Ambulatory Visit: Payer: Self-pay

## 2020-08-03 VITALS — BP 167/80 | HR 71 | Temp 98.1°F | Resp 16 | Ht 67.0 in | Wt 132.0 lb

## 2020-08-03 DIAGNOSIS — Z0000 Encounter for general adult medical examination without abnormal findings: Secondary | ICD-10-CM | POA: Diagnosis not present

## 2020-08-03 DIAGNOSIS — E039 Hypothyroidism, unspecified: Secondary | ICD-10-CM

## 2020-08-03 DIAGNOSIS — I951 Orthostatic hypotension: Secondary | ICD-10-CM

## 2020-08-03 DIAGNOSIS — F5104 Psychophysiologic insomnia: Secondary | ICD-10-CM | POA: Diagnosis not present

## 2020-08-03 DIAGNOSIS — F419 Anxiety disorder, unspecified: Secondary | ICD-10-CM

## 2020-08-03 DIAGNOSIS — I10 Essential (primary) hypertension: Secondary | ICD-10-CM | POA: Diagnosis not present

## 2020-08-03 DIAGNOSIS — Z8639 Personal history of other endocrine, nutritional and metabolic disease: Secondary | ICD-10-CM

## 2020-08-03 DIAGNOSIS — Z79899 Other long term (current) drug therapy: Secondary | ICD-10-CM | POA: Diagnosis not present

## 2020-08-03 DIAGNOSIS — F341 Dysthymic disorder: Secondary | ICD-10-CM

## 2020-08-03 LAB — COMPREHENSIVE METABOLIC PANEL
ALT: 15 U/L (ref 0–35)
AST: 16 U/L (ref 0–37)
Albumin: 4.3 g/dL (ref 3.5–5.2)
Alkaline Phosphatase: 55 U/L (ref 39–117)
BUN: 18 mg/dL (ref 6–23)
CO2: 28 mEq/L (ref 19–32)
Calcium: 9.6 mg/dL (ref 8.4–10.5)
Chloride: 102 mEq/L (ref 96–112)
Creatinine, Ser: 0.74 mg/dL (ref 0.40–1.20)
GFR: 75.98 mL/min (ref >60.00)
Glucose, Bld: 99 mg/dL (ref 70–99)
Potassium: 4.2 mEq/L (ref 3.5–5.1)
Sodium: 138 mEq/L (ref 135–145)
Total Bilirubin: 0.5 mg/dL (ref 0.2–1.2)
Total Protein: 6.9 g/dL (ref 6.0–8.3)

## 2020-08-03 LAB — LIPID PANEL
Cholesterol: 182 mg/dL (ref 0–200)
HDL: 83.7 mg/dL (ref >39.00)
LDL Cholesterol: 76 mg/dL (ref 0–99)
NonHDL: 98.02
Total CHOL/HDL Ratio: 2
Triglycerides: 109 mg/dL (ref 0.0–149.0)
VLDL: 21.8 mg/dL (ref 0.0–40.0)

## 2020-08-03 LAB — CBC WITH DIFFERENTIAL/PLATELET
Basophils Absolute: 0 10*3/uL (ref 0.0–0.1)
Basophils Relative: 0.7 % (ref 0.0–3.0)
Eosinophils Absolute: 0 10*3/uL (ref 0.0–0.7)
Eosinophils Relative: 0.9 % (ref 0.0–5.0)
HCT: 41.7 % (ref 36.0–46.0)
Hemoglobin: 13.9 g/dL (ref 12.0–15.0)
Lymphocytes Relative: 26.9 % (ref 12.0–46.0)
Lymphs Abs: 1.3 10*3/uL (ref 0.7–4.0)
MCHC: 33.4 g/dL (ref 30.0–36.0)
MCV: 94.5 fl (ref 78.0–100.0)
Monocytes Absolute: 0.4 10*3/uL (ref 0.1–1.0)
Monocytes Relative: 8.8 % (ref 3.0–12.0)
Neutro Abs: 3 10*3/uL (ref 1.4–7.7)
Neutrophils Relative %: 62.7 % (ref 43.0–77.0)
Platelets: 183 10*3/uL (ref 150.0–400.0)
RBC: 4.41 Mil/uL (ref 3.87–5.11)
RDW: 13.5 % (ref 11.5–15.5)
WBC: 4.8 10*3/uL (ref 4.0–10.5)

## 2020-08-03 LAB — TSH: TSH: 0.73 u[IU]/mL (ref 0.35–4.50)

## 2020-08-03 MED ORDER — SHINGRIX 50 MCG/0.5ML IM SUSR: 0.5000 mL | Freq: Once | INTRAMUSCULAR | 0 refills | Status: AC

## 2020-08-03 NOTE — Patient Instructions (Signed)
Please return in 6 months for hypertension follow up.  I will release your lab results to you on your MyChart account with further instructions. Please reply with any questions.   If you have any questions or concerns, please don't hesitate to send me a message via MyChart or call the office at [phone]. Thank you for visiting with us today! It's our pleasure caring for you.   

## 2020-08-03 NOTE — Progress Notes (Signed)
Subjective  Chief Complaint  Patient presents with   Annual Exam    Non-fasting, wanting to know if she needs to be taking carbonate v citrate    HPI: Ashley [name] is a 80 y.o. female who presents to [facility] today for a Female Wellness Visit. She also has the concerns and/or needs as listed above in the chief complaint. These will be addressed in addition to the Health Maintenance Visit.   Wellness Visit: annual visit with health maintenance review and exam without Pap  Health maintenance: Screens are all up-to-date.  Overall she physically feels very well.  Remains independent.  Good appetite.  Eligible for Shingrix vaccinations  Chronic disease f/u and/or acute problem visit: (deemed necessary to be done in addition to the wellness visit): Anxiety/dysthymia: She has been doing very well until recently.  She is having to deal with more there is financial responsibility since her husband is less capable now.  Distresses her greatly.  Talking with the financial advisor and getting some help from her daughter-in-law.  Worry tends to overcome her.  She admits to anxiety symptoms and blood pressure tends to elevate when she is stressed. History of hypertension and autonomic orthostatic hypotension: Remains off medications and feels well.  No more symptoms of lightheadedness.  When she checks her blood pressures at home they are normal.  No chest pain or shortness of breath.  She has stress-induced hypertension, blood pressure is mildly elevated today due to her worry. Sleep/insomnia management nightly benzo use.  No falls.  Continues to work well for her. Hypothyroidism on supplements.  Energy levels are excellent.  Weight is stable.  No signs or symptoms of thyroid.  Due for recheck.  Assessment  1. Annual physical exam   2. Acquired hypothyroidism   3. Psychophysiological insomnia   4. Chronic prescription benzodiazepine use   5. Essential hypertension   6.  History of Hashimoto thyroiditis 2017   7. Chronic anxiety   8. Autonomic orthostatic hypotension   9. Dysthymia      Plan  Female Wellness Visit: Age appropriate Health Maintenance and Prevention measures were discussed with patient. Included topics are cancer screening recommendations, ways to keep healthy (see AVS) including dietary and exercise recommendations, regular eye and dental care, use of seat belts, and avoidance of moderate alcohol use and tobacco use.  BMI: discussed patient's BMI and encouraged positive lifestyle modifications to help get to or maintain a target BMI. HM needs and immunizations were addressed and ordered. See below for orders. See HM and immunization section for updates.  Prescription for Shingrix given and counseling done Routine labs and screening tests ordered including cmp, cbc and lipids where appropriate. Discussed recommendations regarding Vit D and calcium supplementation (see AVS)  Chronic disease management visit and/or acute problem visit: History of hypertension: [place]/stress-induced.  Counseling done.  No indication for medications at this time.  We will follow at home readings. Dysthymia and anxiety: Anxiety is active, stress-induced.  Counseling done for management behaviorally.  Will monitor Insomnia and chronic benzos.  Continue to monitor closely.  She understands risks Hypothyroidism: Clinically euthyroid.  Check lab work today.  Follow up: No follow-ups on file.  Orders Placed This Encounter  Procedures   CBC with Differential/Platelet   Comprehensive metabolic panel   Lipid panel   TSH   Meds ordered this encounter  Medications   Zoster Vaccine Adjuvanted [facility]) injection    Sig: Inject 0.5 mLs into the muscle  once for 1 dose. Please give 2nd dose 2-6 months after first dose    Dispense:  2 each    Refill:  0       Body mass index is 20.67 kg/m. Wt Readings from Last 3 Encounters:  08/03/20 132 lb (59.9 kg)   07/12/19 134 lb (60.8 kg)  04/15/19 132 lb 6.4 oz (60.1 kg)     Patient Active Problem List   Diagnosis Date Noted   Acquired hypothyroidism 02/14/2019    Priority: High    Since age 5     Chronic prescription benzodiazepine use 02/14/2019    Priority: High    Ativan for years from prior pcps; uses for sleep     Psychophysiological insomnia 03/26/2018    Priority: High   Essential hypertension 06/17/2015    Priority: High   History of Hashimoto thyroiditis 2017 05/03/2007    Priority: High   Chronic anxiety 05/03/2007    Priority: High    Has failed SSRIs     Osteopenia 08/19/2013    Priority: Medium    Per GYN; has failed evista and biphosphonates; declines further screens, declines prolia. Will get DEXA 2021 for f/u at Dr. Marcelina Morel' office     Atrophic vaginitis 02/14/2019    Priority: Low   Goiter     Priority: Low   Vitamin D deficiency 06/17/2014    Priority: Low   Diverticulosis of large intestine 05/04/2007    Priority: Low   Autonomic orthostatic hypotension 02/25/2020   Dysthymia 06/17/2015   Health Maintenance  Topic Date Due   Zoster Vaccines- Shingrix (1 of 2) Never done   COVID-19 Vaccine (4 - Booster for [employer] series) 08/19/2020 (Originally 04/20/2020)   INFLUENZA VACCINE  09/14/2020   PNA vac Low Risk Adult  Completed   HPV VACCINES  Aged Out   Immunization History  Administered Date(s) Administered   Fluad Quad(high Dose 65+) 11/15/2019   H1N1 02/07/2008   Influenza Inj Mdck Quad Pf 11/17/2017   Influenza Split 12/04/2010, 11/16/2011   Influenza Whole 11/14/2008, 11/14/2009, 11/15/2017   Influenza, High Dose Seasonal PF 11/15/2013, 11/20/2015, 11/25/2016, 11/08/2018   Influenza,inj,Quad PF,6+ Mos 11/15/2014   Influenza-Unspecified 10/15/2012, 11/16/2013   PFIZER(Purple Top)SARS-COV-2 Vaccination 03/13/2019, 04/03/2019, 01/21/2020   Pneumococcal Conjugate-13 06/17/2014   Pneumococcal Polysaccharide-23 05/16/2007   Tdap 11/14/2004,  07/06/2019   We updated and reviewed the patient's past history in detail and it is documented below. Allergies: Patient is allergic to macrobid [nitrofurantoin], alendronate sodium, penicillins, raloxifene, risedronate sodium, shellfish-derived products, citalopram, and latex. Past Medical History Patient  has a past medical history of Diverticulosis of colon (without mention of hemorrhage), Osteoporosis, unspecified, Personal history of other malignant neoplasm of skin, and Unspecified hypothyroidism. Past Surgical History Patient  has a past surgical history that includes Colonoscopy and uterine polyp removal. Family History: Patient family history includes Breast cancer in her maternal aunt and paternal aunt; Diabetes in an other family member; Ovarian cancer in her mother; Pancreatic cancer in her brother; Prostate cancer in her brother. Social History:  Patient  reports that she has never smoked. She has never used smokeless tobacco. She reports that she does not drink alcohol and does not use drugs.  Review of Systems: Constitutional: negative for fever or malaise Ophthalmic: negative for photophobia, double vision or loss of vision Cardiovascular: negative for chest pain, dyspnea on exertion, or new LE swelling Respiratory: negative for SOB or persistent cough Gastrointestinal: negative for abdominal pain, change in bowel habits or melena Genitourinary:  negative for dysuria or gross hematuria, no abnormal uterine bleeding or disharge Musculoskeletal: negative for new gait disturbance or muscular weakness Integumentary: negative for new or persistent rashes, no breast lumps Neurological: negative for TIA or stroke symptoms Psychiatric: negative for SI or delusions Allergic/Immunologic: negative for hives  Patient Care Team    Relationship Specialty Notifications Start End  Willow Ora, MD PCP - General Family Medicine  02/14/19   Talmage Coin, MD Consulting Physician  Endocrinology  09/14/17   [facility] Consulting Physician Dermatology  12/25/17   Ronney Asters Consulting Physician Ophthalmology  12/25/17   Drema Halon, MD Consulting Physician Otolaryngology  12/25/17   Freddy Finner, MD Consulting Physician Obstetrics and Gynecology  12/25/17   Nicola Police, DMD Consulting Physician Dentistry  12/25/17   Fredrich Birks, OD Consulting Physician Optometry  04/30/19   Armbruster, Willaim Rayas, MD Consulting Physician Gastroenterology  04/30/19     Objective  Vitals: BP (!) 167/80   Pulse 71   Temp 98.1 F (36.7 C) (Temporal)   Resp 16   Ht 5\' 7"  (1.702 m)   Wt 132 lb (59.9 kg)   SpO2 98%   BMI 20.67 kg/m  General:  Well developed, well nourished, no acute distress  Psych:  Alert and orientedx3, anxious mood and affect, normal speech HEENT:  Normocephalic, atraumatic, non-icteric sclera,  supple neck without adenopathy, mass or thyromegaly Cardiovascular:  Normal S1, S2, RRR without gallop, rub or murmur Respiratory:  Good breath sounds bilaterally, CTAB with normal respiratory effort Gastrointestinal: normal bowel sounds, soft, non-tender, no noted masses. No HSM MSK: no deformities, contusions. Joints are without erythema or swelling.  Skin:  Warm, no rashes or suspicious lesions noted Neurologic:    Mental status is normal. CN 2-11 are normal. Gross motor and sensory exams are normal. Normal gait. No tremor   Commons side effects, risks, benefits, and alternatives for medications and treatment plan prescribed today were discussed, and the patient expressed understanding of the given instructions. Patient is instructed to call or message via MyChart if he/she has any questions or concerns regarding our treatment plan. No barriers to understanding were identified. We discussed Red Flag symptoms and signs in detail. Patient expressed understanding regarding what to do in case of urgent or emergency type symptoms.  Medication list was  reconciled, printed and provided to the patient in AVS. Patient instructions and summary information was reviewed with the patient as documented in the AVS. This note was prepared with assistance of Dragon voice recognition software. Occasional wrong-word or sound-a-like substitutions may have occurred due to the inherent limitations of voice recognition software  This visit occurred during the SARS-CoV-2 public health emergency.  Safety protocols were in place, including screening questions prior to the visit, additional usage of staff PPE, and extensive cleaning of exam room while observing appropriate contact time as indicated for disinfecting solutions.

## 2020-08-18 ENCOUNTER — Telehealth: Payer: Self-pay

## 2020-08-18 NOTE — Telephone Encounter (Signed)
Patient is calling in wanting to give update on blood pressure.   Lowest readings: Sunday - 133/75 HR: 69 // Monday 139/77 HR:77 Highest Readings: Monday -178/95 HR:71 taken at 11 pm, improved next morning.   Today's reading: 153/80

## 2020-08-31 ENCOUNTER — Telehealth: Payer: Self-pay

## 2020-08-31 NOTE — Telephone Encounter (Signed)
Please schedule for appointment. Thank you

## 2020-08-31 NOTE — Telephone Encounter (Signed)
Patient called in and stated her BP was 167/97 pretty much all weekend with a 72 heartrate she stated she been having head pressure and it comes and goes. Patient wants to know if her medication needs to be changed.

## 2020-08-31 NOTE — Telephone Encounter (Signed)
Patient has been scheduled

## 2020-09-02 ENCOUNTER — Ambulatory Visit: Payer: Medicare Other | Admitting: Family Medicine

## 2020-09-02 ENCOUNTER — Other Ambulatory Visit: Payer: Self-pay

## 2020-09-02 ENCOUNTER — Encounter: Payer: Self-pay | Admitting: Family Medicine

## 2020-09-02 VITALS — BP 142/78 | HR 74 | Temp 98.3°F | Wt 131.6 lb

## 2020-09-02 DIAGNOSIS — I951 Orthostatic hypotension: Secondary | ICD-10-CM

## 2020-09-02 DIAGNOSIS — I10 Essential (primary) hypertension: Secondary | ICD-10-CM

## 2020-09-02 DIAGNOSIS — E039 Hypothyroidism, unspecified: Secondary | ICD-10-CM

## 2020-09-02 DIAGNOSIS — F419 Anxiety disorder, unspecified: Secondary | ICD-10-CM | POA: Diagnosis not present

## 2020-09-02 DIAGNOSIS — F5104 Psychophysiologic insomnia: Secondary | ICD-10-CM

## 2020-09-02 NOTE — Progress Notes (Signed)
Subjective  CC:  Chief Complaint  Patient presents with   Hypertension    Elevated at CPE last month 6/20. Overall good BP readings at home. Started experiencing pressure in her head randomly, believes that her milk/almond milk mixture between meals is elevating her BP. Also believes that her levothyroxine is making her head hurt    HPI: Ashley [name] is a 80 y.o. female who presents to the office today to address the problems listed above in the chief complaint. Hypertension f/u: Blood pressure is mildly elevated at her last visit.  She started home monitoring again.  She shows me her blood pressure readings, most look great, 130s over 80s to 150s over 70s.  She has an elevated reading here and there but these are associated with headaches.  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  She reports that her children have been helpful.  They tell her that everything is fine and she does not need to worry about her investments currently.  She does admit to mind wandering and racing and worrying about these things.  Headaches will ensue afterwards.  Tylenol is helpful. Insomnia: Uses three-quarter of a pill of Ativan at night. Worries that almond milk with thyroid medications are contributing to headaches.  Assessment  1. Essential hypertension   2. Chronic anxiety   3. Autonomic orthostatic hypotension   4. Psychophysiological insomnia   5. Acquired hypothyroidism      Plan   Hypertension f/u: BP control is well controlled.  With history of autonomic hypotension, we will allow mild permissive hypertension.  Discussed goals of blood pressure control.  Her home readings are excellent.  She can stop checking now as this tends to increase her anxiety.  She will remain off antihypertensives Anxiety: Chronic in nature.  Has failed  SSRIs in the past.  Counseling done. Insomnia: Nightly Ativan Hypothyroidism: Reassured.  Levels are at goal.  She may drink almond milk if she like.  Thyroid medicines are not related to headaches.  Education regarding management of these chronic disease states was given. Management strategies discussed on successive visits include dietary and exercise recommendations, goals of achieving and maintaining IBW, and lifestyle modifications aiming for adequate sleep and minimizing stressors.   Follow up: December for recheck  No orders of the defined types were placed in this encounter.  No orders of the defined types were placed in this encounter.     BP Readings from Last 3 Encounters:  09/02/20 (!) 142/78  08/03/20 (!) 167/80  07/12/19 (!) 142/90   Wt Readings from Last 3 Encounters:  09/02/20 131 lb 9.6 oz (59.7 kg)  08/03/20 132 lb (59.9 kg)  07/12/19 134 lb (60.8 kg)    Lab Results  Component Value Date   CHOL 182 08/03/2020   CHOL 183 07/12/2019   CHOL 190 08/15/2017   Lab Results  Component Value Date   HDL 83.70 08/03/2020   HDL 81.20 07/12/2019   HDL 81.60 08/15/2017   Lab Results  Component Value Date   LDLCALC 76 08/03/2020   LDLCALC 87 07/12/2019   LDLCALC 93 08/15/2017   Lab Results  Component Value Date   TRIG 109.0 08/03/2020   TRIG 72.0 07/12/2019   TRIG 76.0 08/15/2017   Lab Results  Component Value Date   CHOLHDL 2 08/03/2020   CHOLHDL 2 07/12/2019  CHOLHDL 2 08/15/2017   Lab Results  Component Value Date   LDLDIRECT 97.1 05/31/2011   LDLDIRECT 107.3 05/27/2009   Lab Results  Component Value Date   CREATININE 0.74 08/03/2020   BUN 18 08/03/2020   NA 138 08/03/2020   K 4.2 08/03/2020   CL 102 08/03/2020   CO2 28 08/03/2020    The ASCVD Risk score Denman George DC Jr., et al., 2013) failed to calculate for the following reasons:   The 2013 ASCVD risk score is only valid for ages 50 to 59  I reviewed the patients updated PMH, FH, and  SocHx.    Patient Active Problem List   Diagnosis Date Noted   Acquired hypothyroidism 02/14/2019    Priority: High   Chronic prescription benzodiazepine use 02/14/2019    Priority: High   Psychophysiological insomnia 03/26/2018    Priority: High   Essential hypertension 06/17/2015    Priority: High   History of Hashimoto thyroiditis 2017 05/03/2007    Priority: High   Chronic anxiety 05/03/2007    Priority: High   Osteopenia 08/19/2013    Priority: Medium   Atrophic vaginitis 02/14/2019    Priority: Low   Goiter     Priority: Low   Vitamin D deficiency 06/17/2014    Priority: Low   Diverticulosis of large intestine 05/04/2007    Priority: Low   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 She has never used smokeless tobacco. She reports that she does not drink alcohol and does not use drugs.  Current Meds  Medication Sig   aspirin 81 MG tablet Take 81 mg by mouth at bedtime. Takes every other day   Calcium Carb-Cholecalciferol 600-800 MG-UNIT TABS Take 1 tablet by mouth daily.   docusate sodium (COLACE) 100 MG capsule Take 100 mg by mouth daily.   estradiol (ESTRACE) 0.1 MG/GM vaginal cream Place 2 Applicatorfuls vaginally at bedtime as needed.   LORazepam (ATIVAN) 1 MG tablet TAKE 1 TABLET BY MOUTH IN THE MORNING, 1 TABLET AT NOON, AND 1 TABLET AT BEDTIME   MAGNESIUM PO Take 1 tablet by mouth as needed.   Multiple Vitamins-Minerals (PRESERVISION AREDS PO) Take 1 tablet by mouth daily.   Vitamin D, Cholecalciferol, 50 MCG (2000 UT) CAPS Take 1 capsule by mouth daily.   Wheat Dextrin (BENEFIBER DRINK MIX PO) Take by mouth.    Review of Systems: Cardiovascular: negative for chest pain, palpitations, leg swelling,  orthopnea Respiratory: negative for SOB, wheezing or persistent cough Gastrointestinal: negative for abdominal pain Genitourinary: negative for dysuria or gross hematuria  Objective  Vitals: BP (!) 142/78 Comment: by home readings avg  Pulse 74   Temp 98.3 F (36.8 C) (Temporal)   Wt 131 lb 9.6 oz (59.7 kg)   SpO2 98%   BMI 20.61 kg/m  General: no acute distress  Psych:  Alert and oriented, normal mood and affect, anxious at first but this improved after counseling done.  Commons side effects, risks, benefits, and alternatives for medications and treatment plan prescribed today were discussed, and the patient expressed understanding of the given instructions. Patient is instructed to call or message via MyChart if he/she has any questions or concerns regarding our treatment plan. No barriers to understanding were identified. We discussed Red Flag symptoms and signs in detail. Patient expressed understanding regarding what to do  in case of urgent or emergency type symptoms.  Medication list was reconciled, printed and provided to the patient in AVS. Patient instructions and summary information was reviewed with the patient as documented in the AVS. This note was prepared with assistance of Dragon voice recognition software. Occasional wrong-word or sound-a-like substitutions may have occurred due to the inherent limitations of voice recognition software  This visit occurred during the SARS-CoV-2 public health emergency.  Safety protocols were in place, including screening questions prior to the visit, additional usage of staff PPE, and extensive cleaning of exam room while observing appropriate contact time as indicated for disinfecting solutions.

## 2020-10-16 ENCOUNTER — Other Ambulatory Visit: Payer: Self-pay | Admitting: Family Medicine

## 2020-10-16 DIAGNOSIS — F419 Anxiety disorder, unspecified: Secondary | ICD-10-CM

## 2020-10-16 NOTE — Telephone Encounter (Signed)
Requesting refill Lorazepam 1 mg. Last OV 09/02/2020.

## 2020-10-20 ENCOUNTER — Other Ambulatory Visit: Payer: Self-pay

## 2020-11-19 ENCOUNTER — Telehealth: Payer: Self-pay

## 2020-11-19 NOTE — Telephone Encounter (Signed)
Please advise 

## 2020-11-19 NOTE — Telephone Encounter (Signed)
LVM for patient to return call. 

## 2020-11-19 NOTE — Telephone Encounter (Signed)
Pt called stating that she hasn't been feeling well but wants to know if she can still get her COVID booster tomorrow. She stated that took a home test today and it was negative. Ashley [name] wants to know if she can still get the vaccine. Please Advise.

## 2020-11-19 NOTE — Telephone Encounter (Signed)
Patient states she was exposed to covid 7 days ago, never tested positive took a covid test today and it was negative. Will take another covid test in the morning and if it is negative she will go ahead and get the booster.

## 2020-12-16 ENCOUNTER — Emergency Department (HOSPITAL_BASED_OUTPATIENT_CLINIC_OR_DEPARTMENT_OTHER)
Admission: EM | Admit: 2020-12-16 | Discharge: 2020-12-16 | Disposition: A | Discharge status: Home / Self Care | Payer: Medicare Other | Attending: Emergency Medicine | Admitting: Emergency Medicine

## 2020-12-16 ENCOUNTER — Other Ambulatory Visit: Payer: Self-pay

## 2020-12-16 ENCOUNTER — Telehealth: Payer: Self-pay

## 2020-12-16 ENCOUNTER — Encounter (HOSPITAL_BASED_OUTPATIENT_CLINIC_OR_DEPARTMENT_OTHER): Payer: Self-pay | Admitting: Obstetrics and Gynecology

## 2020-12-16 DIAGNOSIS — I10 Essential (primary) hypertension: Secondary | ICD-10-CM | POA: Diagnosis present

## 2020-12-16 DIAGNOSIS — D72819 Decreased white blood cell count, unspecified: Secondary | ICD-10-CM | POA: Diagnosis not present

## 2020-12-16 DIAGNOSIS — E039 Hypothyroidism, unspecified: Secondary | ICD-10-CM | POA: Insufficient documentation

## 2020-12-16 DIAGNOSIS — Z91040 Latex allergy status: Secondary | ICD-10-CM | POA: Insufficient documentation

## 2020-12-16 DIAGNOSIS — Z79899 Other long term (current) drug therapy: Secondary | ICD-10-CM | POA: Insufficient documentation

## 2020-12-16 DIAGNOSIS — Z85828 Personal history of other malignant neoplasm of skin: Secondary | ICD-10-CM | POA: Diagnosis not present

## 2020-12-16 HISTORY — DX: Essential (primary) hypertension: I10

## 2020-12-16 LAB — BASIC METABOLIC PANEL
Anion gap: 7 (ref 5–15)
BUN: 14 mg/dL (ref 8–23)
CO2: 30 mmol/L (ref 22–32)
Calcium: 9.5 mg/dL (ref 8.9–10.3)
Chloride: 101 mmol/L (ref 98–111)
Creatinine, Ser: 0.67 mg/dL (ref 0.44–1.00)
GFR, Estimated: >60 mL/min (ref >60)
Glucose, Bld: 112 mg/dL — ABNORMAL HIGH (ref 70–99)
Potassium: 3.9 mmol/L (ref 3.5–5.1)
Sodium: 138 mmol/L (ref 135–145)

## 2020-12-16 LAB — CBC WITH DIFFERENTIAL/PLATELET
Abs Immature Granulocytes: 0.01 10*3/uL (ref 0.00–0.07)
Basophils Absolute: 0 10*3/uL (ref 0.0–0.1)
Basophils Relative: 1 %
Eosinophils Absolute: 0 10*3/uL (ref 0.0–0.5)
Eosinophils Relative: 1 %
HCT: 42.1 % (ref 36.0–46.0)
Hemoglobin: 13.9 g/dL (ref 12.0–15.0)
Immature Granulocytes: 0 %
Lymphocytes Relative: 23 %
Lymphs Abs: 0.9 10*3/uL (ref 0.7–4.0)
MCH: 31.6 pg (ref 26.0–34.0)
MCHC: 33 g/dL (ref 30.0–36.0)
MCV: 95.7 fL (ref 80.0–100.0)
Monocytes Absolute: 0.3 10*3/uL (ref 0.1–1.0)
Monocytes Relative: 7 %
Neutro Abs: 2.5 10*3/uL (ref 1.7–7.7)
Neutrophils Relative %: 68 %
Platelets: 170 10*3/uL (ref 150–400)
RBC: 4.4 MIL/uL (ref 3.87–5.11)
RDW: 12.7 % (ref 11.5–15.5)
WBC: 3.7 10*3/uL — ABNORMAL LOW (ref 4.0–10.5)
nRBC: 0 % (ref 0.0–0.2)

## 2020-12-16 LAB — URINALYSIS, ROUTINE W REFLEX MICROSCOPIC
Bilirubin Urine: NEGATIVE
Glucose, UA: NEGATIVE mg/dL
Hgb urine dipstick: NEGATIVE
Ketones, ur: NEGATIVE mg/dL
Leukocytes,Ua: NEGATIVE
Nitrite: NEGATIVE
Protein, ur: NEGATIVE mg/dL
Specific Gravity, Urine: 1.009 (ref 1.005–1.030)
pH: 7 (ref 5.0–8.0)

## 2020-12-16 NOTE — Telephone Encounter (Signed)
[facility] - Client TELEPHONE ADVICE RECORD Access[facility]urse Patient Name: Ashley [name] Gender: Female DOB: 1941-01-07 Age: 80 Y 7 M 5 [name] Return Phone Number: [phone] (Primary), [phone] (Secondary) Address: City/ State/ Zip: [place]  [address] Client [facility] - [employer] [facility] Physician Asencion Partridge- MD Contact Type Call Who Is Calling Patient / Member / Family / Caregiver Call Type Triage / Clinical Relationship To Patient Self Return Phone Number [phone] (Primary) Chief Complaint Blood Pressure High Reason for Call Symptomatic / Request for Health Information Initial Comment CBWN: Patient called back. Said the nurse called her husband and was suppossed to call her. wasiting for ballback. Caller states she has been experiencing high blood pressure. BP currently at 189/91 sitting and 174/95 standing Translation No Nurse Assessment Nurse: Izora Ribas, RN, Melanie Date/Time (Eastern Time): 12/16/2020 10:02:56 AM Confirm and document reason for call. If symptomatic, describe symptoms. ---Caller states she has been experiencing high blood pressure. BP currently at 189/91 & 174/95, 79 sitting, 158/104, 95 standing, 145/126 laying. Stop BP meds about 3 years ago r/t med reaction. Hashimoto's, syncope. Bad diet. Told to not check BP very often but on Sunday checked BP, no s/s but maybe a headache. Does the patient have any new or worsening symptoms? ---Yes Will a triage be completed? ---Yes Related visit to physician within the last 2 weeks? ---No Does the PT have any chronic conditions? (i.e. diabetes, asthma, this includes High risk factors for pregnancy, etc.) ---Yes List chronic conditions. ---Lorazepam at bedtime. Synthroid , OTC vitamins Is this a behavioral health or substance abuse call? ---N

## 2020-12-16 NOTE — Telephone Encounter (Signed)
Guidelines Guideline Title Affirmed Question Affirmed Notes Nurse Date/Time (Eastern Time) Blood Pressure - High Systolic BP >= 180 OR Diastolic >= [address] RN, Melanie 12/16/2020 10:09:56 AM Disp. Time Lamount Cohen Time) Disposition Final User 12/16/2020 10:15:42 AM See PCP within 24 Hours Yes Izora Ribas, RN, Shawna Orleans Caller Disagree/Comply Comply Caller Understands Yes PreDisposition Call Doctor Care Advice Given Per Guideline SEE PCP WITHIN 24 HOURS: * IF OFFICE WILL BE OPEN: You need to be examined within the next 24 hours. Call your doctor (or NP/PA) when the office opens and make an appointment. HIGH BLOOD PRESSURE: CALL BACK IF: * You become worse CARE ADVICE given per High Blood Pressure (Adult) guideline. Comments User: Patria Mane, RN Date/Time Lamount Cohen Time): 12/16/2020 10:09:55 AM Monday for next appt for follow up. Last seen in April. User: Patria Mane, RN Date/Time Lamount Cohen Time): 12/16/2020 10:13:46 AM Tuesday 160/86, 75 sitting, 150/85 standing User: Patria Mane, RN Date/Time Lamount Cohen Time): 12/16/2020 10:17:22 AM Having issues with fogginess. User: Patria Mane, RN Date/Time Lamount Cohen Time): 12/16/2020 10:20:32 AM Got a message from Dr. Mardelle Matte saying hang on till Monday but with her s/s, suggested calling back with these s/s

## 2020-12-16 NOTE — ED Triage Notes (Signed)
Patient reports to the ER for hypertension. States she has not been on medication for 2 years because she was diagnosed with Hashimoto's disease. Patient states Sunday night she felt strange and this morning she took her BP and it was 170's/90's relatively consistently in that range. Patient reports she called her doctor and was told to come here.

## 2020-12-16 NOTE — Telephone Encounter (Signed)
Patient called back.  States she is very swimmy headed.  States access nurse recommended her to go to [facility] or ED.  States she agrees with access nurse.  States she is going to head to the ED at the [facility].

## 2020-12-16 NOTE — ED Provider Notes (Signed)
[facility] EMERGENCY DEPARTMENT Provider Note  CSN: [id number] Arrival date & time: 12/16/20 1127    History Chief Complaint  Patient presents with   Hypertension    Ashley [name] is a 80 y.o. 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 She previously had HTN but was taken off medications a few years ago due to her lightheadedness. 3 days ago she was checking her husbands BP and decided to check her own and found it to be elevated. She has had anxiety related to her BP in the past and this made her symptoms worse. She has not had any chest pain, SOB, nausea vomiting, no facial droop, slurred speech or arm/leg weakness. She reports she felt fuzzy headed and having trouble concentrating earlier today. Son reports she sounded confused on the phone. She called her PCP who advised her to follow up in the office on Monday but the Triage RN told her to come to the ED.    Past Medical History:  Diagnosis Date   Diverticulosis of colon (without mention of hemorrhage)    Hypertension    Osteoporosis, unspecified    Personal history of other malignant neoplasm of skin    Unspecified hypothyroidism     Past Surgical History:  Procedure Laterality Date   COLONOSCOPY     uterine polyp removal      Family History  Problem Relation Age of Onset   Ovarian cancer Mother    Pancreatic cancer Brother    Diabetes Other    Prostate cancer Brother    Breast cancer Maternal Aunt    Breast cancer Paternal Aunt    Colon cancer Neg Hx    Esophageal cancer Neg Hx    Stomach cancer Neg Hx    Liver disease Neg Hx     Social History   Tobacco Use   Smoking status: Never   Smokeless tobacco: Never  Vaping Use   Vaping Use: Never used  Substance Use Topics   Alcohol use: No   Drug use: No     Home Medications Prior to Admission medications   Medication Sig Start Date End Date Taking? Authorizing Provider   acetaminophen (TYLENOL) 325 MG tablet Take 650 mg by mouth every 6 (six) hours as needed.   Yes [provider]  Calcium Carb-Cholecalciferol 600-800 MG-UNIT TABS Take 1 tablet by mouth daily.   Yes [provider]  estradiol (ESTRACE) 0.1 MG/GM vaginal cream Place 2 Applicatorfuls vaginally at bedtime as needed. 02/14/19  Yes Willow Ora, MD  LORazepam (ATIVAN) 1 MG tablet TAKE 1 TABLET BY MOUTH IN THE MORNING, 1 TABLET AT NOON AND 1 TABLET AT BEDTIME 10/20/20  Yes Willow Ora, MD  Multiple Vitamins-Minerals (PRESERVISION AREDS PO) Take 1 tablet by mouth daily.   Yes [provider]  SYNTHROID 88 MCG tablet TK 1 T PO QD IN THE MORNING OES 04/02/20  Yes Willow Ora, MD  Vitamin D, Cholecalciferol, 50 MCG (2000 UT) CAPS Take 1 capsule by mouth daily.   Yes [provider]  Wheat Dextrin (BENEFIBER DRINK MIX PO) Take by mouth.   Yes [provider]  docusate sodium (COLACE) 100 MG capsule Take 100 mg by mouth daily.    [provider]  MAGNESIUM PO Take 1 tablet by mouth as needed.    [provider]     Allergies    Macrobid [nitrofurantoin], Alendronate sodium, Penicillins, Raloxifene, Risedronate  sodium, Shellfish-derived products, Citalopram, and Latex   Review of Systems   Review of Systems A comprehensive review of systems was completed and negative except as noted in HPI.    Physical Exam BP 138/73   Pulse 71   Temp 98.4 F (36.9 C)   Resp 13   SpO2 94%   Physical Exam Vitals and nursing note reviewed.  Constitutional:      Appearance: Normal appearance.  HENT:     Head: Normocephalic and atraumatic.     Nose: Nose normal.     Mouth/Throat:     Mouth: Mucous membranes are moist.  Eyes:     Extraocular Movements: Extraocular movements intact.     Conjunctiva/sclera: Conjunctivae normal.  Cardiovascular:     Rate and Rhythm: Normal rate.  Pulmonary:     Effort: Pulmonary effort is normal.      Breath sounds: Normal breath sounds.  Abdominal:     General: Abdomen is flat.     Palpations: Abdomen is soft.     Tenderness: There is no abdominal tenderness.  Musculoskeletal:        General: No swelling. Normal range of motion.     Cervical back: Neck supple.  Skin:    General: Skin is warm and dry.  Neurological:     General: No focal deficit present.     Mental Status: She is alert and oriented to person, place, and time.     Cranial Nerves: No cranial nerve deficit.     Sensory: No sensory deficit.     Motor: No weakness.     Gait: Gait normal.  Psychiatric:        Mood and Affect: Mood normal.     ED Results / Procedures / Treatments   Labs (all labs ordered are listed, but only abnormal results are displayed) Labs Reviewed  BASIC METABOLIC PANEL - Abnormal; Notable for the following components:      Result Value   Glucose, Bld 112 (*)    All other components within normal limits  CBC WITH DIFFERENTIAL/PLATELET - Abnormal; Notable for the following components:   WBC 3.7 (*)    All other components within normal limits  URINALYSIS, ROUTINE W REFLEX MICROSCOPIC - Abnormal; Notable for the following components:   Bacteria, UA RARE (*)    All other components within normal limits    EKG EKG Interpretation  Date/Time:  Wednesday December 16 2020 13:04:28 EDT Ventricular Rate:  64 PR Interval:  199 QRS Duration: 79 QT Interval:  362 QTC Calculation: 374 R Axis:   85 Text Interpretation: Duplicate Confirmed by Susy Frizzle [phone]) on 12/16/2020 1:29:06 PM   Radiology No results found.  Procedures Procedures  Medications Ordered in the ED Medications - No data to display   MDM Rules/Calculators/A&P MDM  Patient with elevated BP, she has numerous vague symptoms but most of them seem chronic and previously attributed to her hashimoto's. I tried to reassure her that her elevated blood pressure is not going to cause a stroke. I offered to check labs and  EKG to establish there is no signs of end organ damage.  ED Course  I have reviewed the triage vital signs and the nursing notes.  Pertinent labs & imaging results that were available during my care of the patient were reviewed by me and considered in my medical decision making (see chart for details).  Clinical Course as of 12/16/20 1436  Wed Dec 16, 2020  1323 CBC with mild leukopenia,  similar to previous. Otherwise normal, including differential.  [CS]  1336 UA is normal. No protein.  [CS]  1434 BMP is normal. BP is improved without intervention. Patient has no signs of end organ damage. Recommend PCP follow up as scheduled on Monday. RTED for any other concerns.  [CS]    Clinical Course User Index [CS] Pollyann Savoy, MD    Final Clinical Impression(s) / ED Diagnoses Final diagnoses:  Hypertension, unspecified type    Rx / DC Orders ED Discharge Orders     None        Pollyann Savoy, MD 12/16/20 1436

## 2020-12-16 NOTE — Telephone Encounter (Signed)
I called Ashley [name] and left message to inform her that I spoke to Dr Mardelle Matte.  Dr Mardelle Matte stated that she should remain calm about her BP and she will see her at the appt on 11/7.

## 2020-12-16 NOTE — ED Notes (Signed)
S1 S2 good, lung sounds clear

## 2020-12-16 NOTE — Telephone Encounter (Signed)
Fyi.

## 2020-12-21 ENCOUNTER — Other Ambulatory Visit: Payer: Self-pay

## 2020-12-21 ENCOUNTER — Ambulatory Visit: Payer: Medicare Other | Admitting: Family Medicine

## 2020-12-21 ENCOUNTER — Encounter: Payer: Self-pay | Admitting: Family Medicine

## 2020-12-21 VITALS — BP 160/98 | HR 73 | Temp 98.1°F | Ht 67.0 in | Wt 131.4 lb

## 2020-12-21 DIAGNOSIS — F419 Anxiety disorder, unspecified: Secondary | ICD-10-CM

## 2020-12-21 DIAGNOSIS — I10 Essential (primary) hypertension: Secondary | ICD-10-CM | POA: Diagnosis not present

## 2020-12-21 NOTE — Progress Notes (Signed)
Subjective  CC:  Chief Complaint  Patient presents with   Hypertension   Anxiety    HPI: Ashley [name] is a 80 y.o. female who presents to the office today to address the problems listed above in the chief complaint. Hypertension f/u: Control is fair . Pt reports she is doing okay but continues to have concerns regarding her elevated blood pressures and symptoms of lightheadedness and head fogginess.  Last week, she had elevated blood pressure.  She spoke to her triage nurse.  Because she was feeling confused and sent her to the emergency room.  Emergency room evaluation was unremarkable.  Blood pressure returned to normal after evaluation spontaneously.  She has longstanding borderline hypertensive disease.  She has been on amlodipine low-dose in the past but did have orthostatic hypotension so it was stopped.  She tends to not do well with medications.  She also suffers from chronic anxiety she does worry that the blood pressure is causing her to feel dizzy.  She has no new neurologic symptoms.  Reviewed brain CT report from 2020.  This was done for emergency room evaluation for hypertension.  Diffuse atrophy but nothing acute was found.  Home readings run In average 140s to 150s over 70s to 90s.  She does have 1 elevated reading 170s over 100.  In the emergency room, when she had calm down, her blood pressure was 130/76.  Blood work was unremarkable. Chronic anxiety: She has failed SSRIs in the past.  She is on chronic lorazepam.  This 3 times daily.  Assessment  1. Essential hypertension   2. Chronic anxiety      Plan   Hypertension f/u: BP control is adequately controlled.  Difficult case because she is mildly hypertensive but has history of autonomic orthostatic hypotension and is sensitive to medications.  Discussed possibility of using a low-dose beta-blocker.  She is skeptical.  She will think about it.  I also discussed that I am comfortable watching her blood pressures as long as  they typically run less than 160/100. Chronic anxiety f/u: Counseling and education given.  Patient on lorazepam 3 times daily.  Recommend behavioral management of anxiety attacks.  Education regarding management of these chronic disease states was given. Management strategies discussed on successive visits include dietary and exercise recommendations, goals of achieving and maintaining IBW, and lifestyle modifications aiming for adequate sleep and minimizing stressors.   Follow up: 6 months for complete physical  No orders of the defined types were placed in this encounter.  No orders of the defined types were placed in this encounter.     BP Readings from Last 3 Encounters:  12/21/20 (!) 160/98  12/16/20 (!) 150/84  09/02/20 (!) 142/78   Wt Readings from Last 3 Encounters:  12/21/20 131 lb 6.4 oz (59.6 kg)  09/02/20 131 lb 9.6 oz (59.7 kg)  08/03/20 132 lb (59.9 kg)    Lab Results  Component Value Date   CHOL 182 08/03/2020   CHOL 183 07/12/2019   CHOL 190 08/15/2017   Lab Results  Component Value Date   HDL 83.70 08/03/2020   HDL 81.20 07/12/2019   HDL 81.60 08/15/2017   Lab Results  Component Value Date   LDLCALC 76 08/03/2020   LDLCALC 87 07/12/2019   LDLCALC 93 08/15/2017   Lab Results  Component Value Date   TRIG 109.0 08/03/2020   TRIG 72.0 07/12/2019   TRIG 76.0 08/15/2017   Lab Results  Component Value Date  CHOLHDL 2 08/03/2020   CHOLHDL 2 07/12/2019   CHOLHDL 2 08/15/2017   Lab Results  Component Value Date   LDLDIRECT 97.1 05/31/2011   LDLDIRECT 107.3 05/27/2009   Lab Results  Component Value Date   CREATININE 0.67 12/16/2020   BUN 14 12/16/2020   NA 138 12/16/2020   K 3.9 12/16/2020   CL 101 12/16/2020   CO2 30 12/16/2020    The ASCVD Risk score (Arnett DK, et al., 2019) failed to calculate for the following reasons:   The 2019 ASCVD risk score is only valid for ages 59 to 61  I reviewed the patients updated PMH, FH, and SocHx.     Patient Active Problem List   Diagnosis Date Noted   Acquired hypothyroidism 02/14/2019    Priority: High   Chronic prescription benzodiazepine use 02/14/2019    Priority: High   Psychophysiological insomnia 03/26/2018    Priority: High   Essential hypertension 06/17/2015    Priority: High   History of Hashimoto thyroiditis 2017 05/03/2007    Priority: High   Chronic anxiety 05/03/2007    Priority: High   Osteopenia 08/19/2013    Priority: Medium    Atrophic vaginitis 02/14/2019    Priority: Low   Goiter     Priority: Low   Vitamin D deficiency 06/17/2014    Priority: Low   Diverticulosis of large intestine 05/04/2007    Priority: Low   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 She has never used smokeless tobacco. She reports that she does not drink alcohol and does not use drugs.  Current Meds  Medication Sig   acetaminophen (TYLENOL) 325 MG tablet Take 650 mg by mouth every 6 (six) hours as needed.   Calcium Carb-Cholecalciferol 600-800 MG-UNIT TABS Take 1 tablet by mouth daily.   docusate sodium (COLACE) 100 MG capsule Take 100 mg by mouth daily.   estradiol (ESTRACE) 0.1 MG/GM vaginal cream Place 2 Applicatorfuls vaginally at bedtime as needed.   LORazepam (ATIVAN) 1 MG tablet TAKE 1 TABLET BY MOUTH IN THE MORNING, 1 TABLET AT NOON AND 1 TABLET AT BEDTIME   MAGNESIUM PO Take 1 tablet by mouth as needed.   Multiple Vitamins-Minerals (PRESERVISION AREDS PO) Take 1 tablet by mouth daily.   SYNTHROID 88 MCG tablet TK 1 T PO QD IN THE MORNING OES   Vitamin D, Cholecalciferol, 50 MCG (2000 UT) CAPS Take 1 capsule by mouth daily.   Wheat Dextrin (BENEFIBER DRINK MIX PO) Take by mouth.    Review of Systems: Cardiovascular: negative  for chest pain, palpitations, leg swelling, orthopnea Respiratory: negative for SOB, wheezing or persistent cough Gastrointestinal: negative for abdominal pain Genitourinary: negative for dysuria or gross hematuria  Objective  Vitals: BP (!) 160/98   Pulse 73   Temp 98.1 F (36.7 C) (Temporal)   Ht 5\' 7"  (1.702 m)   Wt 131 lb 6.4 oz (59.6 kg)   SpO2 94%   BMI 20.58 kg/m  General: no acute distress  Psych:  Alert and oriented, anxious mood and affect HEENT:  Normocephalic, atraumatic, supple neck  Cardiovascular:  RRR without murmur. no edema Respiratory:  Good breath sounds bilaterally, CTAB with normal respiratory effort Skin:  Warm, no rashes Neurologic:   Mental status is normal Commons side effects, risks, benefits, and alternatives for medications and treatment  plan prescribed today were discussed, and the patient expressed understanding of the given instructions. Patient is instructed to call or message via MyChart if he/she has any questions or concerns regarding our treatment plan. No barriers to understanding were identified. We discussed Red Flag symptoms and signs in detail. Patient expressed understanding regarding what to do in case of urgent or emergency type symptoms.  Medication list was reconciled, printed and provided to the patient in AVS. Patient instructions and summary information was reviewed with the patient as documented in the AVS. This note was prepared with assistance of Dragon voice recognition software. Occasional wrong-word or sound-a-like substitutions may have occurred due to the inherent limitations of voice recognition software  This visit occurred during the SARS-CoV-2 public health emergency.  Safety protocols were in place, including screening questions prior to the visit, additional usage of staff PPE, and extensive cleaning of exam room while observing appropriate contact time as indicated for disinfecting solutions.

## 2020-12-21 NOTE — Patient Instructions (Signed)
Please return in 6 months for your annual complete physical; please come fasting.   You can consider starting a blood pressure medication called a beta blocker, metoprolol daily.  I want your blood pressures to be < 161/100 almost always. If they are lower, that is good.   Call me in 2-3 weeks if you feel like trying the new blood pressure pill. I do not feel that your blood pressures are the cause of your lightheadedness or fogginess.  If you have any questions or concerns, please don't hesitate to send me a message via MyChart or call the office at [phone]. Thank you for visiting with Ashley [name] today! It's our pleasure caring for you.

## 2020-12-28 ENCOUNTER — Encounter: Payer: Self-pay | Admitting: Family Medicine

## 2020-12-28 ENCOUNTER — Other Ambulatory Visit: Payer: Self-pay

## 2020-12-28 DIAGNOSIS — F419 Anxiety disorder, unspecified: Secondary | ICD-10-CM

## 2020-12-28 MED ORDER — LORAZEPAM 1 MG PO TABS: ORAL_TABLET | ORAL | 5 refills | Status: DC

## 2020-12-30 ENCOUNTER — Ambulatory Visit: Payer: Medicare Other | Admitting: Family Medicine

## 2021-01-06 ENCOUNTER — Ambulatory Visit: Payer: Medicare Other | Admitting: Family Medicine

## 2021-01-18 ENCOUNTER — Other Ambulatory Visit: Payer: Self-pay | Admitting: Family Medicine

## 2021-01-18 DIAGNOSIS — F419 Anxiety disorder, unspecified: Secondary | ICD-10-CM

## 2021-01-18 MED ORDER — LORAZEPAM 1 MG PO TABS: 1.0000 mg | ORAL_TABLET | Freq: Every day | ORAL | 3 refills | Status: DC

## 2021-01-18 MED ORDER — LORAZEPAM 1 MG PO TABS: ORAL_TABLET | ORAL | 3 refills | Status: DC

## 2021-01-18 NOTE — Telephone Encounter (Signed)
Patient requesting refills

## 2021-01-28 ENCOUNTER — Ambulatory Visit: Payer: Medicare Other | Admitting: Family Medicine

## 2021-02-23 ENCOUNTER — Telehealth: Payer: Self-pay

## 2021-02-23 NOTE — Telephone Encounter (Signed)
Please advise 

## 2021-02-23 NOTE — Telephone Encounter (Signed)
Patient called in and stated she been having itching around her neck she is concerned it could have something to do with her thyroid. Patient states she did take a claritin this morning and that did seem to help with the itching.

## 2021-02-24 NOTE — Telephone Encounter (Signed)
Patient has an appointment. May cancel is Claritin continues to work.. States neck has been itching for 4 weeks

## 2021-02-26 ENCOUNTER — Encounter: Payer: Self-pay | Admitting: Family Medicine

## 2021-02-26 ENCOUNTER — Other Ambulatory Visit: Payer: Self-pay

## 2021-02-26 ENCOUNTER — Ambulatory Visit: Payer: Medicare Other | Admitting: Family Medicine

## 2021-02-26 VITALS — BP 156/92 | HR 74 | Temp 98.0°F | Ht 67.0 in | Wt 132.6 lb

## 2021-02-26 DIAGNOSIS — I10 Essential (primary) hypertension: Secondary | ICD-10-CM | POA: Diagnosis not present

## 2021-02-26 DIAGNOSIS — Z7185 Encounter for immunization safety counseling: Secondary | ICD-10-CM | POA: Diagnosis not present

## 2021-02-26 DIAGNOSIS — L239 Allergic contact dermatitis, unspecified cause: Secondary | ICD-10-CM

## 2021-02-26 DIAGNOSIS — F419 Anxiety disorder, unspecified: Secondary | ICD-10-CM

## 2021-02-26 MED ORDER — SHINGRIX 50 MCG/0.5ML IM SUSR: 0.5000 mL | Freq: Once | INTRAMUSCULAR | 0 refills | Status: AC

## 2021-02-26 NOTE — Progress Notes (Signed)
Subjective  CC:  Chief Complaint  Patient presents with   Neck Itching    Pt has Thyroiditis , Present itching for 5 weeks.    HPI: Ashley [name] is a 81 y.o. female who presents to the office today to address the problems listed above in the chief complaint. 81 year old female with chronic anxiety, hypertension with whitecoat syndrome complains of itching, started on her neck.  Has progressed and now with associated itching eyes and some nasal congestion.  She took Claritin and this did help her symptoms.  She just wants to make sure nothing else is going on.  No fevers, sore throat, cough, shortness of breath or chest pain. Anxiety: She has been more stressed at home because her husband is not doing well.  We started him on Aricept he is having some side effects, increased confusion.  She has questions Eligible for Shingrix vaccination.  Counseling given  Assessment  1. Allergic dermatitis   2. White coat syndrome with diagnosis of hypertension   3. Chronic anxiety      Plan  Allergies, allergic dermatitis: Reassured.  Itching related to stressors and allergies.  Continue Claritin as needed.  Can try topical Benadryl.  No symptoms or signs of infection. Anxiety: Adjustment reaction.  Counseled.  We will stop her husband's medications and monitor her anxiety.  Patient agrees. Patient to get shingles vaccination, Shingrix at pharmacy.  Prescription printed  Follow up: As scheduled 06/22/2021  No orders of the defined types were placed in this encounter.  Meds ordered this encounter  Medications   Zoster Vaccine Adjuvanted [facility]) injection    Sig: Inject 0.5 mLs into the muscle once for 1 dose. Please give 2nd dose 2-6 months after first dose    Dispense:  2 each    Refill:  0      I reviewed the patients updated PMH, FH, and SocHx.    Patient Active Problem List   Diagnosis Date Noted   Acquired hypothyroidism 02/14/2019    Priority: High   Chronic prescription  benzodiazepine use 02/14/2019    Priority: High   Psychophysiological insomnia 03/26/2018    Priority: High   Essential hypertension 06/17/2015    Priority: High   History of Hashimoto thyroiditis 2017 05/03/2007    Priority: High   Chronic anxiety 05/03/2007    Priority: High   Osteopenia 08/19/2013    Priority: Medium    Atrophic vaginitis 02/14/2019    Priority: Low   Goiter     Priority: Low   Vitamin D deficiency 06/17/2014    Priority: Low   Diverticulosis of large intestine 05/04/2007    Priority: Low   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   Calcium Carb-Cholecalciferol 600-800 MG-UNIT TABS Take 1 tablet by mouth daily.   docusate sodium (COLACE) 100 MG capsule Take 100 mg by mouth daily.   estradiol (ESTRACE) 0.1 MG/GM vaginal cream Place 2 Applicatorfuls vaginally at bedtime as needed.   LORazepam (ATIVAN) 1 MG tablet Take 1 tablet (1 mg total) by mouth at bedtime. TAKE 1 TABLET BY MOUTH FOR SLEEP   MAGNESIUM PO Take 1 tablet by mouth as needed.   Multiple Vitamins-Minerals (PRESERVISION AREDS PO) Take 1 tablet by mouth daily.   SYNTHROID 88 MCG tablet TK 1 T PO QD IN THE MORNING OES   Vitamin  D, Cholecalciferol, 50 MCG (2000 UT) CAPS Take 1 capsule by mouth daily.   Wheat Dextrin (BENEFIBER DRINK MIX PO) Take by mouth.   Zoster Vaccine Adjuvanted [facility]) injection Inject 0.5 mLs into the muscle once for 1 dose. Please give 2nd dose 2-6 months after first dose    Allergies: Patient is allergic to macrobid [nitrofurantoin], alendronate sodium, penicillins, raloxifene, risedronate sodium, shellfish-derived products, citalopram, and latex. Family History: Patient family history includes Breast cancer in her maternal aunt and paternal aunt; Diabetes in an other family member; Ovarian cancer in her mother; Pancreatic  cancer in her brother; Prostate cancer in her brother. Social History:  Patient  reports that she has never smoked. She has never used smokeless tobacco. She reports that she does not drink alcohol and does not use drugs.  Review of Systems: Constitutional: Negative for fever malaise or anorexia Cardiovascular: negative for chest pain Respiratory: negative for SOB or persistent cough Gastrointestinal: negative for abdominal pain  Objective  Vitals: BP (!) 156/92    Pulse 74    Temp 98 F (36.7 C) (Temporal)    Ht 5\' 7"  (1.702 m)    Wt 132 lb 9.6 oz (60.1 kg)    SpO2 99%    BMI 20.77 kg/m  General: no acute distress , A&Ox3 Psych: Anxious HEENT: PEERL, conjunctiva normal, neck is supple no lymphadenopathy Skin: Seborrheic dermatitis Of scalp, clear neck, no rash Cardiovascular:  RRR without murmur or gallop.  Respiratory:  Good breath sounds bilaterally, CTAB with normal respiratory effort Skin:  Warm, no rashes    Commons side effects, risks, benefits, and alternatives for medications and treatment plan prescribed today were discussed, and the patient expressed understanding of the given instructions. Patient is instructed to call or message via MyChart if he/she has any questions or concerns regarding our treatment plan. No barriers to understanding were identified. We discussed Red Flag symptoms and signs in detail. Patient expressed understanding regarding what to do in case of urgent or emergency type symptoms.  Medication list was reconciled, printed and provided to the patient in AVS. Patient instructions and summary information was reviewed with the patient as documented in the AVS. This note was prepared with assistance of Dragon voice recognition software. Occasional wrong-word or sound-a-like substitutions may have occurred due to the inherent limitations of voice recognition software  This visit occurred during the SARS-CoV-2 public health emergency.  Safety protocols were in  place, including screening questions prior to the visit, additional usage of staff PPE, and extensive cleaning of exam room while observing appropriate contact time as indicated for disinfecting solutions.

## 2021-02-26 NOTE — Patient Instructions (Signed)
Please follow up as scheduled for your next visit with me: 06/22/2021   Try benadryl cream on your neck. Continue the claritin.  You do not have an infection and you should do fine.   Hopefully your nerves will calm back down once we get your husband off of the donazepril.   If you have any questions or concerns, please don't hesitate to send me a message via MyChart or call the office at [phone]. Thank you for visiting with [place] today! It's our pleasure caring for you.

## 2021-03-19 ENCOUNTER — Telehealth: Payer: Self-pay

## 2021-03-19 ENCOUNTER — Telehealth: Payer: Self-pay | Admitting: Family Medicine

## 2021-03-19 NOTE — Telephone Encounter (Signed)
See note

## 2021-03-19 NOTE — Telephone Encounter (Signed)
Patient states she has been experiencing high BP - States Dr. Mardelle Matte informed her to keep an eye on it.    States BP for today is 178/83.  States she is experiencing blurry vision.  States she has already been to ED with high BP and was told they did not want to see her for high BP.  Would like to know if she can be worked in today for high BP or at least start on bp meds today?  Also, would like to know if she can be evaluated for itching that she is experiencing due to what she believes thyroid issues.  I am sending patient to Triage.

## 2021-03-19 NOTE — Telephone Encounter (Signed)
Patient called in BP results:  186/85 BP  70   Heart Rate.  Pt would like a call back from Dr. Jonni Sanger. Pt also wants a medication for her BP.

## 2021-03-19 NOTE — Telephone Encounter (Signed)
Pt is scheduled with Dr. Mardelle Matte on 03/24/21 at 3pm.  Patient Name: Ashley [name] Gender: Female DOB: 1940-07-06 Age: 81 Y 10 M 6 [name] Return Phone Number: [phone] (Primary), [phone] (Secondary) Address: City/ State/ Zip: [place]  [address] Client [facility] - [employer] [facility] Provider Asencion Partridge- MD Contact Type Call Who Is Calling Patient / Member / Family / Caregiver Call Type Triage / Clinical Relationship To Patient Self Return Phone Number [phone] (Primary) Chief Complaint Blood Pressure High Reason for Call Symptomatic / Request for Health Information Initial Comment Caller states she is having blood pressure and blurry vision. Translation No Nurse Assessment Nurse: Tresa Endo, RN, Kim Date/Time (Eastern Time): 03/19/2021 9:46:45 AM Confirm and document reason for call. If symptomatic, describe symptoms. ---Caller states she has been having blurred vision at night, checked her BP this morning and it is 178/83. Not currently on BP med but was told to call if it went above 161/100. Does the patient have any new or worsening symptoms? ---Yes Will a triage be completed? ---Yes Related visit to physician within the last 2 weeks? ---No Does the PT have any chronic conditions? (i.e. diabetes, asthma, this includes High risk factors for pregnancy, etc.) ---Yes List chronic conditions. ---Hashimotos, Hx of HTN Is this a behavioral health or substance abuse call? ---No Guidelines Guideline Title Affirmed Question Affirmed Notes Nurse Date/Time (Eastern Time) Blood Pressure - High [1] Systolic BP >= 160 OR Diastolic >= 100 AND [2] cardiac or neurologic symptoms (e.g., chest pain, difficulty Lendon Colonel 03/19/2021 9:49:49 AM  Guidelines Guideline Title Affirmed Question Affirmed Notes Nurse Date/Time (Eastern Time) breathing, unsteady gait, blurred vision) Disp. Time  Lamount Cohen Time) Disposition Final User 03/19/2021 9:51:56 AM Go to ED Now Yes Tresa Endo, RN, Tami Lin Disagree/Comply Disagree Caller Understands Yes PreDisposition Call Doctor Care Advice Given Per Guideline GO TO ED NOW: * You need to be seen in the Emergency Department. * Go to the ED at ___________ Hospital. NOTE TO TRIAGER - DRIVING: * Another adult should drive. CARE ADVICE given per High Blood Pressure (Adult) guideline. Comments User: Lovenia Shuck, RN Date/Time Lamount Cohen Time): 03/19/2021 9:52:45 AM Caller refuses to go to ER, states the ER doctor was very direct and told her "not to come back to the ER unless an organ was at stake, told me not to come back for my blood pressure. I'm not going there." User: Lovenia Shuck, RN Date/Time Lamount Cohen Time): 03/19/2021 9:58:04 AM Nurse called backline, spoke with Tammy and provided her with caller's information and that she refused to go to ED per outcome. Tammy states she will send message to Dr. Modesta Messing team regarding this and someone will f/u with her. Referrals GO TO FACILITY REFUSED

## 2021-03-19 NOTE — Telephone Encounter (Signed)
Please advise 

## 2021-03-19 NOTE — Telephone Encounter (Signed)
Patient has scheduled for next wed, 8th.  Would like a a call back with what she could do over the weekend in regard to the itching?

## 2021-03-19 NOTE — Telephone Encounter (Signed)
Patient advised and verbalized understanding. Patient also spoke with Dr. Mardelle Matte on the phone.

## 2021-03-21 NOTE — Telephone Encounter (Signed)
Spoke with patient.

## 2021-03-24 ENCOUNTER — Other Ambulatory Visit: Payer: Self-pay

## 2021-03-24 ENCOUNTER — Ambulatory Visit: Payer: Medicare Other | Admitting: Family Medicine

## 2021-03-24 ENCOUNTER — Encounter: Payer: Self-pay | Admitting: Family Medicine

## 2021-03-24 VITALS — BP 176/81 | HR 73 | Temp 98.6°F | Wt 133.0 lb

## 2021-03-24 DIAGNOSIS — I951 Orthostatic hypotension: Secondary | ICD-10-CM

## 2021-03-24 DIAGNOSIS — F419 Anxiety disorder, unspecified: Secondary | ICD-10-CM | POA: Diagnosis not present

## 2021-03-24 DIAGNOSIS — E039 Hypothyroidism, unspecified: Secondary | ICD-10-CM

## 2021-03-24 DIAGNOSIS — I10 Essential (primary) hypertension: Secondary | ICD-10-CM | POA: Diagnosis not present

## 2021-03-24 NOTE — Patient Instructions (Signed)
Please follow up as scheduled for your next visit with me: 06/22/2021  ? ?If you have any questions or concerns, please don't hesitate to send me a message via MyChart or call the office at [phone]. Thank you for visiting with us today! It's our pleasure caring for you.  ? ? ?

## 2021-03-25 LAB — CBC WITH DIFFERENTIAL/PLATELET
Basophils Absolute: 0.1 10*3/uL (ref 0.0–0.1)
Basophils Relative: 1.4 % (ref 0.0–3.0)
Eosinophils Absolute: 0 10*3/uL (ref 0.0–0.7)
Eosinophils Relative: 0.9 % (ref 0.0–5.0)
HCT: 39.4 % (ref 36.0–46.0)
Hemoglobin: 13.2 g/dL (ref 12.0–15.0)
Lymphocytes Relative: 27.6 % (ref 12.0–46.0)
Lymphs Abs: 1.2 10*3/uL (ref 0.7–4.0)
MCHC: 33.6 g/dL (ref 30.0–36.0)
MCV: 94.1 fl (ref 78.0–100.0)
Monocytes Absolute: 0.4 10*3/uL (ref 0.1–1.0)
Monocytes Relative: 8.9 % (ref 3.0–12.0)
Neutro Abs: 2.7 10*3/uL (ref 1.4–7.7)
Neutrophils Relative %: 61.2 % (ref 43.0–77.0)
Platelets: 174 10*3/uL (ref 150.0–400.0)
RBC: 4.19 Mil/uL (ref 3.87–5.11)
RDW: 13.3 % (ref 11.5–15.5)
WBC: 4.4 10*3/uL (ref 4.0–10.5)

## 2021-03-25 LAB — TSH: TSH: 1.12 u[IU]/mL (ref 0.35–5.50)

## 2021-03-25 LAB — BASIC METABOLIC PANEL
BUN: 17 mg/dL (ref 6–23)
CO2: 28 mEq/L (ref 19–32)
Calcium: 9.8 mg/dL (ref 8.4–10.5)
Chloride: 101 mEq/L (ref 96–112)
Creatinine, Ser: 0.85 mg/dL (ref 0.40–1.20)
GFR: 64.05 mL/min (ref >60.00)
Glucose, Bld: 97 mg/dL (ref 70–99)
Potassium: 4.4 mEq/L (ref 3.5–5.1)
Sodium: 137 mEq/L (ref 135–145)

## 2021-03-25 NOTE — Progress Notes (Signed)
Please call patient: I have reviewed his/her lab results. All blood test results are in the perfect range. Everything looks good!

## 2021-03-25 NOTE — Progress Notes (Signed)
Subjective  CC:  Chief Complaint  Patient presents with   Hypertension    Headaches and blurry vision started last week.    Sore Throat    Sore "spot" in her throat that also itches from time to time     HPI: Ashley [name] is a 81 y.o. female who presents to the office today to address the problems listed above in the chief complaint. 81 year old here for follow-up of her blood pressure.  She has chronic anxiety.  She is hypersensitive to side effects from medications.  She has history of orthostatic hypotension on amlodipine 2.5 mg daily and other antihypertensives.  She noted a headache and started checking her blood pressure which was elevated at home recently.  This tends to make her worry about strokes or heart attacks or death.  I spoke to her last week to reassure her.  Here in the office her blood pressure is elevated today.  She does not tolerate blood pressure medicines and does have a history of orthostatic hypotension. Anxiety: Under lots of stress due to preparing her taxes.  She also worries about her health.  He has episodes of "sinking feelings "if she does not eat her meals regularly.  At times she will feel tired and need to lay down.  She specifically denies chest pain, shortness of breath, lower extremity edema, fevers or severe headaches.  She worries about her husband's health who is suffering from early dementia symptoms. She has hypothyroidism.  It has been well controlled.  Assessment  1. White coat syndrome with diagnosis of hypertension   2. Chronic anxiety   3. Acquired hypothyroidism   4. Autonomic orthostatic hypotension      Plan  Whitecoat hypertension: Again counseling done.  Given anxiety component and history of hypotension, permissive mild hypertension is appropriate.  She is intolerant to most medications.  Reassured and continue to monitor.  Goal blood pressures less than 160/100. Chronic anxiety with flares due to stress: She does take Ativan nightly  for sleep.  She has been on this for over 20 years.  Recommend trial of one half dose Ativan as needed for anxiety symptoms.  Counseling done. Recheck thyroid  Follow up: June for complete physical 06/22/2021  Orders Placed This Encounter  Procedures   Basic metabolic panel   CBC with Differential/Platelet   TSH   No orders of the defined types were placed in this encounter.     I reviewed the patients updated PMH, FH, and SocHx.    Patient Active Problem List   Diagnosis Date Noted   Acquired hypothyroidism 02/14/2019    Priority: High   Chronic prescription benzodiazepine use 02/14/2019    Priority: High   Psychophysiological insomnia 03/26/2018    Priority: High   Essential hypertension 06/17/2015    Priority: High   History of Hashimoto thyroiditis 2017 05/03/2007    Priority: High   Chronic anxiety 05/03/2007    Priority: High   Osteopenia 08/19/2013    Priority: Medium    Atrophic vaginitis 02/14/2019    Priority: Low   Goiter     Priority: Low   Vitamin D deficiency 06/17/2014    Priority: Low   Diverticulosis of large intestine 05/04/2007    Priority: Low   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   Calcium Carb-Cholecalciferol 600-800 MG-UNIT  TABS Take 1 tablet by mouth daily.   docusate sodium (COLACE) 100 MG capsule Take 100 mg by mouth daily.   estradiol (ESTRACE) 0.1 MG/GM vaginal cream Place 2 Applicatorfuls vaginally at bedtime as needed.   LORazepam (ATIVAN) 1 MG tablet Take 1 tablet (1 mg total) by mouth at bedtime. TAKE 1 TABLET BY MOUTH FOR SLEEP   MAGNESIUM PO Take 1 tablet by mouth as needed.   Multiple Vitamins-Minerals (PRESERVISION AREDS PO) Take 1 tablet by mouth daily.   SYNTHROID 88 MCG tablet TK 1 T PO QD IN THE MORNING OES   Vitamin D, Cholecalciferol, 50 MCG (2000 UT) CAPS Take 1 capsule  by mouth daily.   Wheat Dextrin (BENEFIBER DRINK MIX PO) Take by mouth.    Allergies: Patient is allergic to macrobid [nitrofurantoin], alendronate sodium, penicillins, raloxifene, risedronate sodium, shellfish-derived products, citalopram, and latex. Family History: Patient family history includes Breast cancer in her maternal aunt and paternal aunt; Diabetes in an other family member; Ovarian cancer in her mother; Pancreatic cancer in her brother; Prostate cancer in her brother. Social History:  Patient  reports that she has never smoked. She has never used smokeless tobacco. She reports that she does not drink alcohol and does not use drugs.  Review of Systems: Constitutional: Negative for fever malaise or anorexia Cardiovascular: negative for chest pain Respiratory: negative for SOB or persistent cough Gastrointestinal: negative for abdominal pain  Objective  Vitals: BP (!) 176/81    Pulse 73    Temp 98.6 F (37 C) (Temporal)    Wt 133 lb (60.3 kg)    SpO2 96%    BMI 20.83 kg/m  General: no acute distress , A&Ox3 Psych: Very anxious HEENT: PEERL, conjunctiva normal, neck is supple Cardiovascular:  RRR without murmur or gallop.  Respiratory:  Good breath sounds bilaterally, CTAB with normal respiratory effort Skin:  Warm, no rashes    Commons side effects, risks, benefits, and alternatives for medications and treatment plan prescribed today were discussed, and the patient expressed understanding of the given instructions. Patient is instructed to call or message via MyChart if he/she has any questions or concerns regarding our treatment plan. No barriers to understanding were identified. We discussed Red Flag symptoms and signs in detail. Patient expressed understanding regarding what to do in case of urgent or emergency type symptoms.  Medication list was reconciled, printed and provided to the patient in AVS. Patient instructions and summary information was reviewed with the  patient as documented in the AVS. This note was prepared with assistance of Dragon voice recognition software. Occasional wrong-word or sound-a-like substitutions may have occurred due to the inherent limitations of voice recognition software  This visit occurred during the SARS-CoV-2 public health emergency.  Safety protocols were in place, including screening questions prior to the visit, additional usage of staff PPE, and extensive cleaning of exam room while observing appropriate contact time as indicated for disinfecting solutions.

## 2021-03-26 NOTE — Progress Notes (Signed)
Pt gave verbalized understanding.

## 2021-04-13 ENCOUNTER — Ambulatory Visit (INDEPENDENT_AMBULATORY_CARE_PROVIDER_SITE_OTHER): Payer: Medicare Other

## 2021-04-13 ENCOUNTER — Other Ambulatory Visit: Payer: Self-pay

## 2021-04-13 DIAGNOSIS — Z0000 Encounter for general adult medical examination without abnormal findings: Secondary | ICD-10-CM

## 2021-04-13 NOTE — Progress Notes (Signed)
Virtual Visit via Telephone Note  I connected with  Ashley [name] on 04/13/21 at 10:15 AM EST by telephone and verified that I am speaking with the correct person using two identifiers.  Medicare Annual Wellness visit completed telephonically due to Covid-19 pandemic.   Persons participating in this call: This Health Coach and this patient along with Husband Ashley [name]  Location: Patient: Home Provider: Office   I discussed the limitations, risks, security and privacy concerns of performing an evaluation and management service by telephone and the availability of in person appointments. The patient expressed understanding and agreed to proceed.  Unable to perform video visit due to video visit attempted and failed and/or patient does not have video capability.   Some vital signs may be absent or patient reported.   Marzella Schlein, LPN   Subjective:   Ashley [name] is a 81 y.o. female who presents for Medicare Annual (Subsequent) preventive examination.  Review of Systems     Cardiac Risk Factors include: advanced age (>75men, >79 women);hypertension     Objective:    There were no vitals filed for this visit. There is no height or weight on file to calculate BMI.  Advanced Directives 04/13/2021 12/16/2020 04/30/2019 12/28/2017 12/26/2016 12/26/2016 01/28/2016  Does Patient Have a Medical Advance Directive? Yes No Yes Yes - Yes No  Type of Advance Directive Healthcare Power of Attorney - Living will;Healthcare Power of Attorney - - - -  Does patient want to make changes to medical advance directive? - - No - Patient declined - Yes (MAU/Ambulatory/Procedural Areas - Information given) No - Patient declined -  Copy of Healthcare Power of Attorney in Chart? No - copy requested - No - copy requested - - - -  Would patient like information on creating a medical advance directive? - No - Patient declined - - - - -    Current Medications (verified) Outpatient Encounter Medications as of  04/13/2021  Medication Sig   acetaminophen (TYLENOL) 325 MG tablet Take 650 mg by mouth every 6 (six) hours as needed.   Calcium Carb-Cholecalciferol 600-800 MG-UNIT TABS Take 1 tablet by mouth daily.   docusate sodium (COLACE) 100 MG capsule Take 100 mg by mouth daily as needed. As needed   estradiol (ESTRACE) 0.1 MG/GM vaginal cream Place 2 Applicatorfuls vaginally at bedtime as needed.   LORazepam (ATIVAN) 1 MG tablet Take 1 tablet (1 mg total) by mouth at bedtime. TAKE 1 TABLET BY MOUTH FOR SLEEP   MAGNESIUM PO Take 1 tablet by mouth as needed.   Multiple Vitamins-Minerals (PRESERVISION AREDS PO) Take 1 tablet by mouth daily.   SYNTHROID 88 MCG tablet TK 1 T PO QD IN THE MORNING OES   Vitamin D, Cholecalciferol, 50 MCG (2000 UT) CAPS Take 1 capsule by mouth daily.   Wheat Dextrin (BENEFIBER DRINK MIX PO) Take by mouth. As needed   No facility-administered encounter medications on file as of 04/13/2021.    Allergies (verified) Macrobid [nitrofurantoin], Alendronate sodium, Penicillins, Raloxifene, Risedronate sodium, Shellfish-derived products, Citalopram, and Latex   History: Past Medical History:  Diagnosis Date   Diverticulosis of colon (without mention of hemorrhage)    Hypertension    Osteoporosis, unspecified    Personal history of other malignant neoplasm of skin    Unspecified hypothyroidism    Past Surgical History:  Procedure Laterality Date   COLONOSCOPY     uterine polyp removal     Family History  Problem Relation Age of Onset  Ovarian cancer Mother    Pancreatic cancer Brother    Diabetes Other    Prostate cancer Brother    Breast cancer Maternal Aunt    Breast cancer Paternal Aunt    Colon cancer Neg Hx    Esophageal cancer Neg Hx    Stomach cancer Neg Hx    Liver disease Neg Hx    Social History   Socioeconomic History   Marital status: Married    Spouse name: Molly Maduro   Number of children: 2   Years of education: Not on file   Highest education  level: Not on file  Occupational History   Occupation: Retired  Tobacco Use   Smoking status: Never   Smokeless tobacco: Never  Vaping Use   Vaping Use: Never used  Substance and Sexual Activity   Alcohol use: No   Drug use: No   Sexual activity: Yes  Other Topics Concern   Not on file  Social History Narrative   Not on file   Social Determinants of Health   Financial Resource Strain: Low Risk    Difficulty of Paying Living Expenses: Not hard at all  Food Insecurity: No Food Insecurity   Worried About Programme researcher, broadcasting/film/video in the Last Year: Never true   Ran Out of Food in the Last Year: Never true  Transportation Needs: No Transportation Needs   Lack of Transportation (Medical): No   Lack of Transportation (Non-Medical): No  Physical Activity: Inactive   Days of Exercise per Week: 0 days   Minutes of Exercise per Session: 0 min  Stress: No Stress Concern Present   Feeling of Stress : Not at all  Social Connections: Moderately Integrated   Frequency of Communication with Friends and Family: More than three times a week   Frequency of Social Gatherings with Friends and Family: More than three times a week   Attends Religious Services: 1 to 4 times per year   Active Member of [employer] or Organizations: No   Attends Engineer, structural: Never   Marital Status: Married    Tobacco Counseling Counseling given: Not Answered   Clinical Intake:  Pre-visit preparation completed: Yes  Pain : No/denies pain     BMI - recorded: 20.77 Nutritional Status: BMI of 19-24  Normal Nutritional Risks: None Diabetes: No  How often do you need to have someone help you when you read instructions, pamphlets, or other written materials from your doctor or pharmacy?: 1 - Never  Diabetic?no  Interpreter Needed?: No  Information entered by :: Lanier Ensign, LPN   Activities of Daily Living In your present state of health, do you have any difficulty performing the following  activities: 04/13/2021 02/26/2021  Hearing? N N  Vision? N N  Difficulty concentrating or making decisions? N Y  Walking or climbing stairs? N N  Dressing or bathing? N N  Doing errands, shopping? N N  Preparing Food and eating ? N -  Using the Toilet? N -  In the past six months, have you accidently leaked urine? N -  Do you have problems with loss of bowel control? N -  Managing your Medications? N -  Managing your Finances? N -  Housekeeping or managing your Housekeeping? N -  Some recent data might be hidden    Patient Care Team: Willow Ora, MD as PCP - General (Family Medicine) Talmage Coin, MD as Consulting Physician (Endocrinology) [facility] as Consulting Physician (Dermatology) Ronney Asters as Consulting Physician (  Ophthalmology) Drema HalonNewman, Christopher E, MD (Inactive) as Consulting Physician (Otolaryngology) Freddy FinnerNeal, W Ronald, MD as Consulting Physician (Obstetrics and Gynecology) Nicola Policeivils, John David, DMD as Consulting Physician (Dentistry) Fredrich BirksScott, Jon, OD as Consulting Physician (Optometry) Armbruster, Willaim RayasSteven P, MD as Consulting Physician (Gastroenterology)  Indicate any recent Medical Services you may have received from other than [facility] providers in the past year (date may be approximate).     Assessment:   This is a routine wellness examination for [place].  Hearing/Vision screen Hearing Screening - Comments:: Pt denies any hearing issues  Vision Screening - Comments:: Pt follows up with Dr Fredrich BirksJon Scott for annual eye exams   Dietary issues and exercise activities discussed: Current Exercise Habits: The patient does not participate in regular exercise at present   Goals Addressed             This Visit's Progress    Patient Stated       Continue working on improving symptoms for thyroid        Depression Screen PHQ 2/9 Scores 04/13/2021 12/21/2020 08/03/2020 07/12/2019 04/30/2019 02/14/2019 03/26/2018  PHQ - 2 Score 0 0 0 0 0 0 0  PHQ- 9 Score - 1 - 2 - 1  2    Fall Risk Fall Risk  04/13/2021 08/03/2020 04/30/2019 02/14/2019 12/28/2017  Falls in the past year? 0 0 0 0 0  Number falls in past yr: 0 0 0 0 -  Injury with Fall? 0 0 0 - -  Risk for fall due to : Impaired vision - - - -  Follow up Falls prevention discussed - Falls evaluation completed;Education provided;Falls prevention discussed - -    FALL RISK PREVENTION PERTAINING TO THE HOME:  Any stairs in or around the home? Yes  If so, are there any without handrails? Yes  Home free of loose throw rugs in walkways, pet beds, electrical cords, etc? Yes  Adequate lighting in your home to reduce risk of falls? Yes   ASSISTIVE DEVICES UTILIZED TO PREVENT FALLS:  Life alert? No  Use of a cane, walker or w/c? No  Grab bars in the bathroom? Yes  Shower chair or bench in shower? Yes  Elevated toilet seat or a handicapped toilet? Yes  TIMED UP AND GO:  Was the test performed? No .   Cognitive Function: MMSE - Mini Mental State Exam 12/28/2017  Not completed: (No Data)     6CIT Screen 04/13/2021 04/30/2019  What Year? 0 points 0 points  What month? 0 points 0 points  What time? 0 points 0 points  Count back from 20 0 points 0 points  Months in reverse 0 points 0 points  Repeat phrase 0 points 0 points  Total Score 0 0    Immunizations Immunization History  Administered Date(s) Administered   Fluad Quad(high Dose 65+) 11/15/2019, 10/29/2020   H1N1 02/07/2008   Influenza Inj Mdck Quad Pf 11/17/2017   Influenza Split 12/04/2010, 11/16/2011   Influenza Whole 11/14/2008, 11/14/2009, 11/15/2017   Influenza, High Dose Seasonal PF 11/15/2013, 11/20/2015, 11/25/2016, 11/08/2018   Influenza,inj,Quad PF,6+ Mos 11/15/2014   Influenza-Unspecified 10/15/2012, 11/16/2013   PFIZER(Purple Top)SARS-COV-2 Vaccination 03/13/2019, 04/03/2019, 01/21/2020   Pfizer Covid-19 Vaccine Bivalent Booster 6953yrs & up 11/20/2020   Pneumococcal Conjugate-13 06/17/2014   Pneumococcal Polysaccharide-23  05/16/2007   Tdap 11/14/2004, 07/06/2019    TDAP status: Up to date  Flu Vaccine status: Up to date  Pneumococcal vaccine status: Up to date  Covid-19 vaccine status: Completed vaccines  Qualifies for Shingles  Vaccine? Yes   Zostavax completed No   Shingrix Completed?: No.    Education has been provided regarding the importance of this vaccine. Patient has been advised to call insurance company to determine out of pocket expense if they have not yet received this vaccine. Advised may also receive vaccine at local pharmacy or Health Dept. Verbalized acceptance and understanding.  Screening Tests Health Maintenance  Topic Date Due   Zoster Vaccines- Shingrix (1 of 2) Never done   Pneumonia Vaccine 91+ Years old  Completed   INFLUENZA VACCINE  Completed   COVID-19 Vaccine  Completed   HPV VACCINES  Aged Out    Health Maintenance  Health Maintenance Due  Topic Date Due   Zoster Vaccines- Shingrix (1 of 2) Never done    Colorectal cancer screening: No longer required.   Mammogram status: Completed 06/11/13. Repeat every year  Bone Density status: Completed 08/13/19. Results reflect: Bone density results: OSTEOPENIA. Repeat every 0 years.   Additional Screening:   Vision Screening: Recommended annual ophthalmology exams for early detection of glaucoma and other disorders of the eye. Is the patient up to date with their annual eye exam?  Yes  Who is the provider or what is the name of the office in which the patient attends annual eye exams? Dr Fredrich Birks  If pt is not established with a provider, would they like to be referred to a provider to establish care? No .   Dental Screening: Recommended annual dental exams for proper oral hygiene  Community Resource Referral / Chronic Care Management: CRR required this visit?  No   CCM required this visit?  No      Plan:     I have personally reviewed and noted the following in the patients chart:   Medical and social  history Use of alcohol, tobacco or illicit drugs  Current medications and supplements including opioid prescriptions.  Functional ability and status Nutritional status Physical activity Advanced directives List of other physicians Hospitalizations, surgeries, and ER visits in previous 12 months Vitals Screenings to include cognitive, depression, and falls Referrals and appointments  In addition, I have reviewed and discussed with patient certain preventive protocols, quality metrics, and best practice recommendations. A written personalized care plan for preventive services as well as general preventive health recommendations were provided to patient.     Marzella Schlein, LPN   10/17/4095   Nurse Notes: None

## 2021-04-13 NOTE — Patient Instructions (Addendum)
Ashley [name] , Thank you for taking time to come for your Medicare Wellness Visit. I appreciate your ongoing commitment to your health goals. Please review the following plan we discussed and let me know if I can assist you in the future.   Screening recommendations/referrals: Colonoscopy: no longer required  Mammogram: done 06/11/13  Bone Density: completed 08/13/19 Recommended yearly ophthalmology/optometry visit for glaucoma screening and checkup Recommended yearly dental visit for hygiene and checkup  Vaccinations: Influenza vaccine: done 10/29/20 repeat every year Pneumococcal vaccine: Up to date Tdap vaccine: Done 08/06/15 repeat every 10 years  Shingles vaccine: Shingrix discussed. Please contact your pharmacy for coverage information.    Covid-19:Completed 1/27, 2/17,01/21/20 & 11/20/20  Advanced directives: Please bring a copy of your health care power of attorney and living will to the office at your convenience.  Conditions/risks identified: Continue working on improving symptoms for thyroid   Next appointment: Follow up in one year for your annual wellness visit    Preventive Care 65 Years and Older, Female Preventive care refers to lifestyle choices and visits with your health care provider that can promote health and wellness. What does preventive care include? A yearly physical exam. This is also called an annual well check. Dental exams once or twice a year. Routine eye exams. Ask your health care provider how often you should have your eyes checked. Personal lifestyle choices, including: Daily care of your teeth and gums. Regular physical activity. Eating a healthy diet. Avoiding tobacco and drug use. Limiting alcohol use. Practicing safe sex. Taking low-dose aspirin every day. Taking vitamin and mineral supplements as recommended by your health care provider. What happens during an annual well check? The services and screenings done by your health care provider during  your annual well check will depend on your age, overall health, lifestyle risk factors, and family history of disease. Counseling  Your health care provider may ask you questions about your: Alcohol use. Tobacco use. Drug use. Emotional well-being. Home and relationship well-being. Sexual activity. Eating habits. History of falls. Memory and ability to understand (cognition). Work and work Statistician. Reproductive health. Screening  You may have the following tests or measurements: Height, weight, and BMI. Blood pressure. Lipid and cholesterol levels. These may be checked every 5 years, or more frequently if you are over 36 years old. Skin check. Lung cancer screening. You may have this screening every year starting at age 19 if you have a 30-pack-year history of smoking and currently smoke or have quit within the past 15 years. Fecal occult blood test (FOBT) of the stool. You may have this test every year starting at age 68. Flexible sigmoidoscopy or colonoscopy. You may have a sigmoidoscopy every 5 years or a colonoscopy every 10 years starting at age 46. Hepatitis C blood test. Hepatitis B blood test. Sexually transmitted disease (STD) testing. Diabetes screening. This is done by checking your blood sugar (glucose) after you have not eaten for a while (fasting). You may have this done every 1-3 years. Bone density scan. This is done to screen for osteoporosis. You may have this done starting at age 55. Mammogram. This may be done every 1-2 years. Talk to your health care provider about how often you should have regular mammograms. Talk with your health care provider about your test results, treatment options, and if necessary, the need for more tests. Vaccines  Your health care provider may recommend certain vaccines, such as: Influenza vaccine. This is recommended every year. Tetanus, diphtheria, and acellular  pertussis (Tdap, Td) vaccine. You may need a Td booster every 10  years. Zoster vaccine. You may need this after age 42. Pneumococcal 13-valent conjugate (PCV13) vaccine. One dose is recommended after age 86. Pneumococcal polysaccharide (PPSV23) vaccine. One dose is recommended after age 81. Talk to your health care provider about which screenings and vaccines you need and how often you need them. This information is not intended to replace advice given to you by your health care provider. Make sure you discuss any questions you have with your health care provider. Document Released: 02/27/2015 Document Revised: 10/21/2015 Document Reviewed: 12/02/2014 Elsevier Interactive Patient Education  2017 [employer] Prevention in the Home Falls can cause injuries. They can happen to people of all ages. There are many things you can do to make your home safe and to help prevent falls. What can I do on the outside of my home? Regularly fix the edges of walkways and driveways and fix any cracks. Remove anything that might make you trip as you walk through a door, such as a raised step or threshold. Trim any bushes or trees on the path to your home. Use bright outdoor lighting. Clear any walking paths of anything that might make someone trip, such as rocks or tools. Regularly check to see if handrails are loose or broken. Make sure that both sides of any steps have handrails. Any raised decks and porches should have guardrails on the edges. Have any leaves, snow, or ice cleared regularly. Use sand or salt on walking paths during winter. Clean up any spills in your garage right away. This includes oil or grease spills. What can I do in the bathroom? Use night lights. Install grab bars by the toilet and in the tub and shower. Do not use towel bars as grab bars. Use non-skid mats or decals in the tub or shower. If you need to sit down in the shower, use a plastic, non-slip stool. Keep the floor dry. Clean up any water that spills on the floor as soon as it  happens. Remove soap buildup in the tub or shower regularly. Attach bath mats securely with double-sided non-slip rug tape. Do not have throw rugs and other things on the floor that can make you trip. What can I do in the bedroom? Use night lights. Make sure that you have a light by your bed that is easy to reach. Do not use any sheets or blankets that are too big for your bed. They should not hang down onto the floor. Have a firm chair that has side arms. You can use this for support while you get dressed. Do not have throw rugs and other things on the floor that can make you trip. What can I do in the kitchen? Clean up any spills right away. Avoid walking on wet floors. Keep items that you use a lot in easy-to-reach places. If you need to reach something above you, use a strong step stool that has a grab bar. Keep electrical cords out of the way. Do not use floor polish or wax that makes floors slippery. If you must use wax, use non-skid floor wax. Do not have throw rugs and other things on the floor that can make you trip. What can I do with my stairs? Do not leave any items on the stairs. Make sure that there are handrails on both sides of the stairs and use them. Fix handrails that are broken or loose. Make sure that handrails  are as long as the stairways. Check any carpeting to make sure that it is firmly attached to the stairs. Fix any carpet that is loose or worn. Avoid having throw rugs at the top or bottom of the stairs. If you do have throw rugs, attach them to the floor with carpet tape. Make sure that you have a light switch at the top of the stairs and the bottom of the stairs. If you do not have them, ask someone to add them for you. What else can I do to help prevent falls? Wear shoes that: Do not have high heels. Have rubber bottoms. Are comfortable and fit you well. Are closed at the toe. Do not wear sandals. If you use a stepladder: Make sure that it is fully opened.  Do not climb a closed stepladder. Make sure that both sides of the stepladder are locked into place. Ask someone to hold it for you, if possible. Clearly mark and make sure that you can see: Any grab bars or handrails. First and last steps. Where the edge of each step is. Use tools that help you move around (mobility aids) if they are needed. These include: Canes. Walkers. Scooters. Crutches. Turn on the lights when you go into a dark area. Replace any light bulbs as soon as they burn out. Set up your furniture so you have a clear path. Avoid moving your furniture around. If any of your floors are uneven, fix them. If there are any pets around you, be aware of where they are. Review your medicines with your doctor. Some medicines can make you feel dizzy. This can increase your chance of falling. Ask your doctor what other things that you can do to help prevent falls. This information is not intended to replace advice given to you by your health care provider. Make sure you discuss any questions you have with your health care provider. Document Released: 11/27/2008 Document Revised: 07/09/2015 Document Reviewed: 03/07/2014 Elsevier Interactive Patient Education  2017 [employer].

## 2021-04-16 ENCOUNTER — Other Ambulatory Visit: Payer: Self-pay | Admitting: Family Medicine

## 2021-04-20 ENCOUNTER — Other Ambulatory Visit: Payer: Self-pay | Admitting: Family Medicine

## 2021-05-07 ENCOUNTER — Telehealth: Payer: Self-pay | Admitting: Family Medicine

## 2021-05-07 NOTE — Telephone Encounter (Signed)
Message has been sent to pt thru MyChart. ?

## 2021-05-07 NOTE — Telephone Encounter (Signed)
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 She is asking if Dr Jonni Sanger has any recommendations for something over the counter she may try. She does not want to come in for an appt. Please advise ?

## 2021-06-22 ENCOUNTER — Ambulatory Visit (INDEPENDENT_AMBULATORY_CARE_PROVIDER_SITE_OTHER): Payer: Medicare Other | Admitting: Family Medicine

## 2021-06-22 ENCOUNTER — Encounter: Payer: Self-pay | Admitting: Family Medicine

## 2021-06-22 VITALS — BP 156/80 | HR 80 | Temp 97.9°F | Ht 67.0 in | Wt 134.0 lb

## 2021-06-22 DIAGNOSIS — F5104 Psychophysiologic insomnia: Secondary | ICD-10-CM | POA: Diagnosis not present

## 2021-06-22 DIAGNOSIS — I951 Orthostatic hypotension: Secondary | ICD-10-CM

## 2021-06-22 DIAGNOSIS — Z79899 Other long term (current) drug therapy: Secondary | ICD-10-CM

## 2021-06-22 DIAGNOSIS — E039 Hypothyroidism, unspecified: Secondary | ICD-10-CM

## 2021-06-22 DIAGNOSIS — I10 Essential (primary) hypertension: Secondary | ICD-10-CM | POA: Diagnosis not present

## 2021-06-22 DIAGNOSIS — H547 Unspecified visual loss: Secondary | ICD-10-CM

## 2021-06-22 DIAGNOSIS — Z0000 Encounter for general adult medical examination without abnormal findings: Secondary | ICD-10-CM | POA: Diagnosis not present

## 2021-06-22 DIAGNOSIS — N952 Postmenopausal atrophic vaginitis: Secondary | ICD-10-CM

## 2021-06-22 LAB — COMPREHENSIVE METABOLIC PANEL
ALT: 16 U/L (ref 0–35)
AST: 17 U/L (ref 0–37)
Albumin: 4.1 g/dL (ref 3.5–5.2)
Alkaline Phosphatase: 59 U/L (ref 39–117)
BUN: 17 mg/dL (ref 6–23)
CO2: 29 mEq/L (ref 19–32)
Calcium: 9.5 mg/dL (ref 8.4–10.5)
Chloride: 102 mEq/L (ref 96–112)
Creatinine, Ser: 0.71 mg/dL (ref 0.40–1.20)
GFR: 79.35 mL/min (ref >60.00)
Glucose, Bld: 92 mg/dL (ref 70–99)
Potassium: 4.3 mEq/L (ref 3.5–5.1)
Sodium: 137 mEq/L (ref 135–145)
Total Bilirubin: 0.6 mg/dL (ref 0.2–1.2)
Total Protein: 7 g/dL (ref 6.0–8.3)

## 2021-06-22 LAB — LIPID PANEL
Cholesterol: 188 mg/dL (ref 0–200)
HDL: 83.2 mg/dL (ref >39.00)
LDL Cholesterol: 78 mg/dL (ref 0–99)
NonHDL: 104.93
Total CHOL/HDL Ratio: 2
Triglycerides: 135 mg/dL (ref 0.0–149.0)
VLDL: 27 mg/dL (ref 0.0–40.0)

## 2021-06-22 MED ORDER — NEBIVOLOL HCL 2.5 MG PO TABS: 2.5000 mg | ORAL_TABLET | Freq: Every day | ORAL | 2 refills | Status: DC

## 2021-06-22 NOTE — Addendum Note (Signed)
Addended by: Laddie Aquas A on: 06/22/2021 10:55 AM ? ? Modules accepted: Orders ? ?

## 2021-06-22 NOTE — Patient Instructions (Addendum)
Please return in 3-4 weeks to recheck blood pressure and anxiety.  ? ?I will release your lab results to you on your MyChart account with further instructions. You may see the results before I do, but when I review them I will send you a message with my report or have my assistant call you if things need to be discussed. Please reply to my message with any questions. Thank you!  ? ?If you have any questions or concerns, please don't hesitate to send me a message via MyChart or call the office at [phone]. Thank you for visiting with [place] today! It's our pleasure caring for you.  ?

## 2021-06-22 NOTE — Progress Notes (Signed)
Subjective  Chief Complaint  Patient presents with   Annual Exam    Pt here for Annual exam and is not currently fasting    HPI: Ashley [name] is a 81 y.o. female who presents to [facility] today for a Female Wellness Visit. She also has the concerns and/or needs as listed above in the chief complaint. These will be addressed in addition to the Health Maintenance Visit.   Wellness Visit: annual visit with health maintenance review and exam without Pap  Health maintenance: 81 year old healthy lifestyle.  Married lives with husband.  No further screenings indicated at this time.  Immunizations: Eligible for Shingrix. Chronic disease f/u and/or acute problem visit: (deemed necessary to be done in addition to the wellness visit): Hypertension history of autonomic hypotension and sensitive to medications.  Blood pressure remains mildly elevated.  Still with intermittent symptoms of headache and or lightheadedness.  She had autonomic hypotension on 2.5 mg of amlodipine.  She has not been on any other antihypertensive.  No chest pain or palpitations.  She does have anxiety related hypertensive response as well.  Today her mood is stable Low thyroid: Recent TSH was normal on levothyroxine.  Energy levels are good. Uses chronic benzos for sleep.  As needed for anxiety Recent GYN eval for pelvic pressure.  Normal urine.  Treated with vaginal Estrace.  I reviewed note. Complains of poor vision while reading.  Has not had her eyes examined recently.  No double vision  Assessment  1. Annual physical exam   2. Essential hypertension   3. Acquired hypothyroidism   4. Chronic prescription benzodiazepine use   5. Psychophysiological insomnia   6. Atrophic vaginitis   7. Autonomic orthostatic hypotension   8. Vision problems      Plan  Female Wellness Visit: Age appropriate Health Maintenance and Prevention measures were discussed with patient. Included topics are cancer  screening recommendations, ways to keep healthy (see AVS) including dietary and exercise recommendations, regular eye and dental care, use of seat belts, and avoidance of moderate alcohol use and tobacco use.  BMI: discussed patient's BMI and encouraged positive lifestyle modifications to help get to or maintain a target BMI. HM needs and immunizations were addressed and ordered. See below for orders. See HM and immunization section for updates. Routine labs and screening tests ordered including cmp, cbc and lipids where appropriate. Discussed recommendations regarding Vit D and calcium supplementation (see AVS)  Chronic disease management visit and/or acute problem visit: Hypertension with history of autonomic dysfunction.  Trial low-dose beta-blocker, Bystolic 2.5 mg daily.  Education given.  Recheck 3 to 4 weeks.  Close follow-up Chronic anxiety on benzos.  Patient did disclose that she used to be on sertraline in the past.  That worked well for her.  We will discuss restarting again in follow-up.  Deferred today due to starting Bystolic. Monitor benzo use.  Use for sleep.  Greater than 20 years use. Monitor for orthostatic hypotension Refer to ophthalmology for eye exam Hypothyroidism is well controlled  Follow up: 3 to 4 weeks for recheck blood pressure Orders Placed This Encounter  Procedures   Comprehensive metabolic panel   Lipid panel   Ambulatory referral to Ophthalmology   Meds ordered this encounter  Medications   nebivolol (BYSTOLIC) 2.5 MG tablet    Sig: Take 1 tablet (2.5 mg total) by mouth daily.    Dispense:  30 tablet    Refill:  2  Body mass index is 20.99 kg/m. Wt Readings from Last 3 Encounters:  06/22/21 134 lb (60.8 kg)  03/24/21 133 lb (60.3 kg)  02/26/21 132 lb 9.6 oz (60.1 kg)     Patient Active Problem List   Diagnosis Date Noted   Acquired hypothyroidism 02/14/2019    Priority: High    Since age 55     Chronic prescription benzodiazepine  use 02/14/2019    Priority: High    Ativan for years from prior pcps; uses for sleep     Psychophysiological insomnia 03/26/2018    Priority: High   Essential hypertension 06/17/2015    Priority: High   History of Hashimoto thyroiditis 2017 05/03/2007    Priority: High   Chronic anxiety 05/03/2007    Priority: High    Has failed SSRIs     Osteopenia 08/19/2013    Priority: Medium     Per GYN; has failed evista and biphosphonates; declines further screens, declines prolia. Will get DEXA 2021 for f/u at Dr. Marcelina Morel' office     Atrophic vaginitis 02/14/2019    Priority: Low   Goiter     Priority: Low   Vitamin D deficiency 06/17/2014    Priority: Low   Diverticulosis of large intestine 05/04/2007    Priority: Low   Autonomic orthostatic hypotension 02/25/2020   Dysthymia 06/17/2015   Health Maintenance  Topic Date Due   Zoster Vaccines- Shingrix (1 of 2) Never done   INFLUENZA VACCINE  09/14/2021   Pneumonia Vaccine 61+ Years old  Completed   COVID-19 Vaccine  Completed   HPV VACCINES  Aged Out   Immunization History  Administered Date(s) Administered   Fluad Quad(high Dose 65+) 11/15/2019, 10/29/2020   H1N1 02/07/2008   Influenza Inj Mdck Quad Pf 11/17/2017   Influenza Split 12/04/2010, 11/16/2011   Influenza Whole 11/14/2008, 11/14/2009, 11/15/2017   Influenza, High Dose Seasonal PF 11/15/2013, 11/20/2015, 11/25/2016, 11/08/2018   Influenza,inj,Quad PF,6+ Mos 11/15/2014   Influenza-Unspecified 10/15/2012, 11/16/2013   PFIZER(Purple Top)SARS-COV-2 Vaccination 03/13/2019, 04/03/2019, 01/21/2020   Pfizer Covid-19 Vaccine Bivalent Booster 39yrs & up 11/20/2020   Pneumococcal Conjugate-13 06/17/2014   Pneumococcal Polysaccharide-23 05/16/2007   Tdap 11/14/2004, 07/06/2019   We updated and reviewed the patient's past history in detail and it is documented below. Allergies: Patient is allergic to macrobid [nitrofurantoin], alendronate sodium, penicillins, raloxifene,  risedronate sodium, shellfish-derived products, citalopram, and latex. Past Medical History Patient  has a past medical history of Diverticulosis of colon (without mention of hemorrhage), Hypertension, Osteoporosis, unspecified, Personal history of other malignant neoplasm of skin, and Unspecified hypothyroidism. Past Surgical History Patient  has a past surgical history that includes Colonoscopy and uterine polyp removal. Family History: Patient family history includes Breast cancer in her maternal aunt and paternal aunt; Diabetes in an other family member; Ovarian cancer in her mother; Pancreatic cancer in her brother; Prostate cancer in her brother. Social History:  Patient  reports that she has never smoked. She has never used smokeless tobacco. She reports that she does not drink alcohol and does not use drugs.  Review of Systems: Constitutional: negative for fever or malaise Ophthalmic: negative for photophobia, double vision or loss of vision Cardiovascular: negative for chest pain, dyspnea on exertion, or new LE swelling Respiratory: negative for SOB or persistent cough Gastrointestinal: negative for abdominal pain, change in bowel habits or melena Genitourinary: negative for dysuria or gross hematuria, no abnormal uterine bleeding or disharge Musculoskeletal: negative for new gait disturbance or muscular weakness Integumentary: negative for new  or persistent rashes, no breast lumps Neurological: negative for TIA or stroke symptoms Psychiatric: negative for SI or delusions Allergic/Immunologic: negative for hives  Patient Care Team    Relationship Specialty Notifications Start End  Willow Ora, MD PCP - General Family Medicine  02/14/19   Talmage Coin, MD Consulting Physician Endocrinology  09/14/17   [facility] Consulting Physician Dermatology  12/25/17   Ronney Asters Consulting Physician Ophthalmology  12/25/17   Drema Halon, MD (Inactive) Consulting  Physician Otolaryngology  12/25/17   Freddy Finner, MD Consulting Physician Obstetrics and Gynecology  12/25/17   Nicola Police, DMD Consulting Physician Dentistry  12/25/17   Fredrich Birks, OD Consulting Physician Optometry  04/30/19   Armbruster, Willaim Rayas, MD Consulting Physician Gastroenterology  04/30/19     Objective  Vitals: BP (!) 156/80 Comment: Lt arm  Pulse 80   Temp 97.9 F (36.6 C)   Ht 5\' 7"  (1.702 m)   Wt 134 lb (60.8 kg)   SpO2 97%   BMI 20.99 kg/m  General:  Well developed, well nourished, no acute distress  Psych:  Alert and orientedx3,normal mood and affect HEENT:  Normocephalic, atraumatic, non-icteric sclera,  supple neck without adenopathy, mass or thyromegaly Cardiovascular:  Normal S1, S2, RRR without gallop, rub or murmur Respiratory:  Good breath sounds bilaterally, CTAB with normal respiratory effort Gastrointestinal: normal bowel sounds, soft, non-tender, no noted masses. No HSM MSK: no deformities, contusions. Joints are without erythema or swelling.  Skin:  Warm, no rashes or suspicious lesions noted   Commons side effects, risks, benefits, and alternatives for medications and treatment plan prescribed today were discussed, and the patient expressed understanding of the given instructions. Patient is instructed to call or message via MyChart if he/she has any questions or concerns regarding our treatment plan. No barriers to understanding were identified. We discussed Red Flag symptoms and signs in detail. Patient expressed understanding regarding what to do in case of urgent or emergency type symptoms.  Medication list was reconciled, printed and provided to the patient in AVS. Patient instructions and summary information was reviewed with the patient as documented in the AVS. This note was prepared with assistance of Dragon voice recognition software. Occasional wrong-word or sound-a-like substitutions may have occurred due to the inherent limitations of  voice recognition software  This visit occurred during the SARS-CoV-2 public health emergency.  Safety protocols were in place, including screening questions prior to the visit, additional usage of staff PPE, and extensive cleaning of exam room while observing appropriate contact time as indicated for disinfecting solutions.

## 2021-07-06 ENCOUNTER — Telehealth: Payer: Self-pay | Admitting: Family Medicine

## 2021-07-06 ENCOUNTER — Encounter: Payer: Self-pay | Admitting: Family Medicine

## 2021-07-06 NOTE — Telephone Encounter (Signed)
Patients BP has been "on and off" patient stated she will give the medication more time. Stated she received message about ophthalmology referral - Patient was provided with [facility] phone number and address so that patient can call and sch.

## 2021-07-06 NOTE — Telephone Encounter (Signed)
Noted  

## 2021-07-15 ENCOUNTER — Encounter: Payer: Self-pay | Admitting: Gastroenterology

## 2021-07-16 ENCOUNTER — Telehealth: Payer: Self-pay | Admitting: Family Medicine

## 2021-07-16 NOTE — Telephone Encounter (Signed)
Pt states her BP has been fluctuating but keeps getting elevated. Currently it is 171/78 and pulse is 63. Currently being triaged

## 2021-07-16 NOTE — Telephone Encounter (Signed)
I have spoken with pt. She is going to keep her original appt with Jonni Sanger on 07/21/21. I advised if she has any more episodes over the weekend to go the urgent. We agreed that I will call her Monday morning at 8am to see how she is.

## 2021-07-16 NOTE — Telephone Encounter (Signed)
Patient Name: Ashley [name] Gender: Female DOB: [birth year] Age: 81 Y 2 M 5 [name] Return Phone Number: [phone] (Primary) Address: City/ State/ Zip: [place]  [address] Client [facility] - Administrator, [facility] at [facility] Provider Asencion Partridge- MD Contact Type Call Who Is Calling Patient / Member / Family / Caregiver Call Type Triage / Clinical Relationship To Patient Self Return Phone Number [phone] (Primary) Chief Complaint Blood Pressure High Reason for Call Symptomatic / Request for Health Information Initial Comment Caller states her blood pressure is going up and down and she wants to speak to a nurse about it. She just checked it and it is 171/78 and her heart rate is 63. She just started a new blood pressure medication to see how it will work. Translation No Nurse Assessment Nurse: [place], RN, Marchelle Folks Date/Time (Eastern Time): 07/16/2021 2:17:35 PM Confirm and document reason for call. If symptomatic, describe symptoms. ---caller states her bp is 171/78 and HR 63. just started bp meds 2.5mg  nebivolol says her bp goes up and down. has a headache Does the patient have any new or worsening symptoms? ---Yes Will a triage be completed? ---Yes Related visit to physician within the last 2 weeks? ---Yes Does the PT have any chronic conditions? (i.e. diabetes, asthma, this includes High risk factors for pregnancy, etc.) ---Yes List chronic conditions. ---HTN, hashimoto's Is this a behavioral health or substance abuse call? ---No Guidelines Guideline Title Affirmed Question Affirmed Notes Nurse Date/Time (Eastern Time) Blood Pressure - High Systolic BP >= 160 OR Diastolic >= 100 Humfleet, RN, Marchelle Folks 07/16/2021 2:19:53 PM Disp. Time Lamount Cohen Time) Disposition Final User  07/16/2021 2:26:43 PM SEE PCP WITHIN 3 DAYS Yes [place]et, RN, Earnestine Leys Disagree/Comply Comply Caller Understands  Yes PreDisposition Did not know what to do Care Advice Given Per Guideline SEE PCP WITHIN 3 DAYS: * You need to be seen within 2 or 3 days. * PCP VISIT: Call your doctor (or NP/PA) during regular office hours and make an appointment. A clinic or urgent care center are good places to go for care if your doctor's office is closed or you can't get an appointment. NOTE: If office will be open tomorrow, tell caller to call then, not in 3 days. CARE ADVICE given per High Blood Pressure (Adult) guideline. CALL BACK IF: * You become worse * Chest pain or difficulty breathing occurs * Difficulty walking, difficulty talking, or severe headache occurs * Weakness or numbness of the face, arm or leg on one side of the body occurs Comments User: Jaclynn Major, RN Date/Time Lamount Cohen Time): 07/16/2021 2:28:26 PM patient wrote down instructions. understands Referrals REFERRED TO PCP OFFICE

## 2021-07-19 NOTE — Telephone Encounter (Signed)
Noted  

## 2021-07-19 NOTE — Telephone Encounter (Signed)
Spoke with pt this morning. She stated she is feeling fine and will be in on 07/21/21. She has kept a log of her BP readings and will bring those in.  She is also needing to know when her last pneumonia vacc was and what type. Please advise.

## 2021-07-21 ENCOUNTER — Ambulatory Visit: Payer: Medicare Other | Admitting: Family Medicine

## 2021-07-21 ENCOUNTER — Encounter: Payer: Self-pay | Admitting: Family Medicine

## 2021-07-21 VITALS — BP 138/82 | HR 68 | Temp 98.3°F | Ht 67.0 in | Wt 134.8 lb

## 2021-07-21 DIAGNOSIS — I10 Essential (primary) hypertension: Secondary | ICD-10-CM | POA: Diagnosis not present

## 2021-07-21 DIAGNOSIS — F411 Generalized anxiety disorder: Secondary | ICD-10-CM | POA: Diagnosis not present

## 2021-07-21 MED ORDER — LORAZEPAM 1 MG PO TABS: 0.5000 mg | ORAL_TABLET | Freq: Two times a day (BID) | ORAL | 5 refills | Status: DC | PRN

## 2021-07-21 MED ORDER — SERTRALINE HCL 25 MG PO TABS: 25.0000 mg | ORAL_TABLET | Freq: Every day | ORAL | 3 refills | Status: DC

## 2021-07-21 NOTE — Patient Instructions (Signed)
Please return in 4-6 weeks to recheck mood and blood pressure.   Start sertraline daily to help with your anxiety.   If you have any questions or concerns, please don't hesitate to send me a message via MyChart or call the office at [phone]. Thank you for visiting with [place] today! It's our pleasure caring for you.

## 2021-07-21 NOTE — Progress Notes (Signed)
Subjective  CC:  Chief Complaint  Patient presents with   Hypertension    Pt here to F/U with Bp and anxiety   Anxiety    HPI: Ashley [name] is a 81 y.o. female who presents to the office today to address the problems listed above in the chief complaint. Hypertension f/u: short term f/u for htn after starting medication: See last note.  Restarted Bystolic 2.5 mg daily.  She is very sensitive to medication.  She also has anxiety hypertensive response.  She is been checking blood pressures regularly.  She has been able to tolerate Bystolic but has to cut it in half.  She takes 1/2 in the morning and half midday.  She reports when she takes a full pill she feels drained.  Blood pressures have been responded nicely.  Over the last week they have been averaging 130s to 140s over 80s.  Today her blood pressure is very elevated in the office but this is typical.  She has had a whitecoat response.  She has not had any low blood pressure readings. Anxiety: She has had chronic anxiety for many years.  Uses lorazepam at night to sleep and has been using lorazepam, half pill, for stress tension headaches and worry.  She has been using this once to twice weekly and she does say it helps.  It also helps lower her blood pressure her blood pressure is up from worry.  She has used sertraline in the past and this is worked well for her anxiety.  She did have a bad reaction to Celexa and continues to worry about restarting medications.  Assessment  1. Essential hypertension   2. GAD (generalized anxiety disorder)      Plan   Hypertension f/u: BP control is fairly well controlled.  Blood pressure is improving.  Unfortunately she is tolerating a very low-dose beta-blocker.  We will continue at the same dose.  She will continue to monitor blood pressures and we will recheck again in 4 to 6 weeks.  We will slowly titrate medicine because she is so sensitive if needed. Generalized anxiety disorder: This does affect  her hypertension control.  Counseling education given.  Start sertraline 25 mg daily.  Education regarding management of these chronic disease states was given. Management strategies discussed on successive visits include dietary and exercise recommendations, goals of achieving and maintaining IBW, and lifestyle modifications aiming for adequate sleep and minimizing stressors.   Follow up: Return in about 6 weeks (around 09/01/2021) for follow up Hypertension, mood follow up.  No orders of the defined types were placed in this encounter.  Meds ordered this encounter  Medications   LORazepam (ATIVAN) 1 MG tablet    Sig: Take 0.5-1 tablets (0.5-1 mg total) by mouth 2 (two) times daily as needed for anxiety or sleep.    Dispense:  60 tablet    Refill:  5   sertraline (ZOLOFT) 25 MG tablet    Sig: Take 1 tablet (25 mg total) by mouth daily.    Dispense:  90 tablet    Refill:  3      BP Readings from Last 3 Encounters:  07/21/21 138/82  06/22/21 (!) 156/80  03/24/21 (!) 176/81   Wt Readings from Last 3 Encounters:  07/21/21 134 lb 12.8 oz (61.1 kg)  06/22/21 134 lb (60.8 kg)  03/24/21 133 lb (60.3 kg)    Lab Results  Component Value Date   CHOL 188 06/22/2021   CHOL 182 08/03/2020  CHOL 183 07/12/2019   Lab Results  Component Value Date   HDL 83.20 06/22/2021   HDL 83.70 08/03/2020   HDL 81.20 07/12/2019   Lab Results  Component Value Date   LDLCALC 78 06/22/2021   LDLCALC 76 08/03/2020   LDLCALC 87 07/12/2019   Lab Results  Component Value Date   TRIG 135.0 06/22/2021   TRIG 109.0 08/03/2020   TRIG 72.0 07/12/2019   Lab Results  Component Value Date   CHOLHDL 2 06/22/2021   CHOLHDL 2 08/03/2020   CHOLHDL 2 07/12/2019   Lab Results  Component Value Date   LDLDIRECT 97.1 05/31/2011   LDLDIRECT 107.3 05/27/2009   Lab Results  Component Value Date   CREATININE 0.71 06/22/2021   BUN 17 06/22/2021   NA 137 06/22/2021   K 4.3 06/22/2021   CL 102  06/22/2021   CO2 29 06/22/2021    The ASCVD Risk score (Arnett DK, et al., 2019) failed to calculate for the following reasons:   The 2019 ASCVD risk score is only valid for ages 5940 to 179  I reviewed the patients updated PMH, FH, and SocHx.    Patient Active Problem List   Diagnosis Date Noted   Acquired hypothyroidism 02/14/2019    Priority: High   Chronic prescription benzodiazepine use 02/14/2019    Priority: High   Psychophysiological insomnia 03/26/2018    Priority: High   Essential hypertension 06/17/2015    Priority: High   History of Hashimoto thyroiditis 2017 05/03/2007    Priority: High   Chronic anxiety 05/03/2007    Priority: High   Osteopenia 08/19/2013    Priority: Medium    Atrophic vaginitis 02/14/2019    Priority: Low   Goiter     Priority: Low   Vitamin D deficiency 06/17/2014    Priority: Low   Diverticulosis of large intestine 05/04/2007    Priority: Low   GAD (generalized anxiety disorder) 07/21/2021   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 She has never used smokeless tobacco. She reports that she does not drink alcohol and does not use drugs.  Current Meds  Medication Sig   acetaminophen (TYLENOL) 325 MG tablet Take 650 mg by mouth every 6 (six) hours as needed.   Calcium Carb-Cholecalciferol 600-800 MG-UNIT TABS Take 1 tablet by mouth daily.   docusate sodium (COLACE) 100 MG capsule Take 100 mg by mouth daily as needed. As needed   estradiol (ESTRACE) 0.1 MG/GM vaginal cream Place 2 Applicatorfuls vaginally at bedtime as needed.   Multiple Vitamins-Minerals (PRESERVISION AREDS PO) Take 1 tablet by mouth daily.   nebivolol (BYSTOLIC) 2.5 MG tablet Take 1 tablet (2.5 mg total) by mouth daily.    sertraline (ZOLOFT) 25 MG tablet Take 1 tablet (25 mg total) by mouth daily.   SYNTHROID 88 MCG tablet TAKE 1 TABLET BY MOUTH  DAILY IN THE MORNING ON AN  EMPTY STOMACH   Vitamin D, Cholecalciferol, 50 MCG (2000 UT) CAPS Take 1 capsule by mouth daily.   Wheat Dextrin (BENEFIBER DRINK MIX PO) Take by mouth. As needed   [DISCONTINUED] LORazepam (ATIVAN) 1 MG tablet Take 1 tablet (1 mg total) by mouth at bedtime. TAKE 1 TABLET BY MOUTH FOR SLEEP    Review of Systems: Cardiovascular: negative for chest pain, palpitations, leg swelling, orthopnea Respiratory: negative for SOB,  wheezing or persistent cough Gastrointestinal: negative for abdominal pain Genitourinary: negative for dysuria or gross hematuria  Objective  Vitals: BP 138/82 Comment: by home readings this week  Pulse 68   Temp 98.3 F (36.8 C)   Ht 5\' 7"  (1.702 m)   Wt 134 lb 12.8 oz (61.1 kg)   SpO2 98%   BMI 21.11 kg/m  General: no acute distress  Psych:  Alert and oriented, normal mood and affect, anxious but happy HEENT:  Normocephalic, atraumatic, supple neck  Cardiovascular:  RRR without murmur. no edema Respiratory:  Good breath sounds bilaterally, CTAB with normal respiratory effort Skin:  Warm, no rashes Neurologic:   Mental status is normal Commons side effects, risks, benefits, and alternatives for medications and treatment plan prescribed today were discussed, and the patient expressed understanding of the given instructions. Patient is instructed to call or message via MyChart if he/she has any questions or concerns regarding our treatment plan. No barriers to understanding were identified. We discussed Red Flag symptoms and signs in detail. Patient expressed understanding regarding what to do in case of urgent or emergency type symptoms.  Medication list was reconciled, printed and provided to the patient in AVS. Patient instructions and summary information was reviewed with the patient as documented in the AVS. This  note was prepared with assistance of Dragon voice recognition software. Occasional wrong-word or sound-a-like substitutions may have occurred due to the inherent limitations of voice recognition software  This visit occurred during the SARS-CoV-2 public health emergency.  Safety protocols were in place, including screening questions prior to the visit, additional usage of staff PPE, and extensive cleaning of exam room while observing appropriate contact time as indicated for disinfecting solutions.

## 2021-08-10 ENCOUNTER — Encounter: Payer: Self-pay | Admitting: Family Medicine

## 2021-08-30 ENCOUNTER — Ambulatory Visit: Payer: Medicare Other | Admitting: Family Medicine

## 2021-08-30 ENCOUNTER — Encounter: Payer: Self-pay | Admitting: Family Medicine

## 2021-08-30 VITALS — BP 138/74 | HR 62 | Temp 98.7°F | Ht 67.0 in | Wt 134.6 lb

## 2021-08-30 DIAGNOSIS — H539 Unspecified visual disturbance: Secondary | ICD-10-CM

## 2021-08-30 DIAGNOSIS — R519 Headache, unspecified: Secondary | ICD-10-CM

## 2021-08-30 DIAGNOSIS — F411 Generalized anxiety disorder: Secondary | ICD-10-CM

## 2021-08-30 DIAGNOSIS — I10 Essential (primary) hypertension: Secondary | ICD-10-CM

## 2021-08-30 LAB — CBC WITH DIFFERENTIAL/PLATELET
Basophils Absolute: 0 10*3/uL (ref 0.0–0.1)
Basophils Relative: 0.7 % (ref 0.0–3.0)
Eosinophils Absolute: 0 10*3/uL (ref 0.0–0.7)
Eosinophils Relative: 0.7 % (ref 0.0–5.0)
HCT: 41.5 % (ref 36.0–46.0)
Hemoglobin: 13.8 g/dL (ref 12.0–15.0)
Lymphocytes Relative: 25.5 % (ref 12.0–46.0)
Lymphs Abs: 1.2 10*3/uL (ref 0.7–4.0)
MCHC: 33.4 g/dL (ref 30.0–36.0)
MCV: 96.5 fl (ref 78.0–100.0)
Monocytes Absolute: 0.4 10*3/uL (ref 0.1–1.0)
Monocytes Relative: 9 % (ref 3.0–12.0)
Neutro Abs: 3 10*3/uL (ref 1.4–7.7)
Neutrophils Relative %: 64.1 % (ref 43.0–77.0)
Platelets: 181 10*3/uL (ref 150.0–400.0)
RBC: 4.3 Mil/uL (ref 3.87–5.11)
RDW: 13.1 % (ref 11.5–15.5)
WBC: 4.7 10*3/uL (ref 4.0–10.5)

## 2021-08-30 LAB — SEDIMENTATION RATE: Sed Rate: 10 mm/hr (ref 0–30)

## 2021-08-30 LAB — C-REACTIVE PROTEIN: CRP: <1 mg/dL (ref 0.5–20.0)

## 2021-08-30 NOTE — Patient Instructions (Signed)
Please return in 3 months to recheck anxiety and blood pressure.   I have ordered a brain MRI: this is because of your worsening headaches associated with visual changes.  We will call to get you scheduled once we get your insurance to approve it.   Please take your sertraline at night before bedtime.  Please take your entire blood pressure pill daily. It is a very low dose.   If you have any questions or concerns, please don't hesitate to send me a message via MyChart or call the office at [phone]. Thank you for visiting with [place] today! It's our pleasure caring for you.

## 2021-08-30 NOTE — Progress Notes (Signed)
Subjective  CC:  Chief Complaint  Patient presents with   Hypertension    HPI: Ashley [name] is a 81 y.o. female who presents to the office today to address the problems listed above in the chief complaint. Hypertension f/u: home readings remain stable with some mild elevations. Has continued to take bystolic 1.25 bid. Tolerating well.  GAD: tolerating sertraline 25 but reports occ dizziness. Takes in am. Feels it may be helping however with increased anxiety in part due to eye problems. Using lorazepam which helps. Headaches: has tension headaches and anxiety related headaches but reports worsening headaches that are different over the last 2 weeks. C/o frontal severe headache that is associated with visual changes described as blinding. No eye pain. No pain with chewing. Dr. Dione Booze has evaluated her eyes and no cause of headache found. No temperature. No headache now. No neurologic changes except feels like has bilateral vision loss. (Vague historian).   Assessment  1. Essential hypertension   2. GAD (generalized anxiety disorder)   3. Intractable episodic headache, unspecified headache type   4. Temporary visual disturbance      Plan   Hypertension f/u: BP control is fairly well controlled. Will ask her to take the 2.5mg  pill daily (whole pill) to see if she can not tolerate it. Will recheck bp's in 3 months. Adjust dose up slowly if needed at that time.  GAD: change sertraline to 25mg  qhs to see if tolerates it. Would like to increase to 50mg  but will need to make changes slowly. Continue lorazepam as needed  Headaches with possible visual loss/changes and normal optic nerve per ophtho; needs brain MRI. Check labs for GCA  Education regarding management of these chronic disease states was given. Management strategies discussed on successive visits include dietary and exercise recommendations, goals of achieving and maintaining IBW, and lifestyle modifications aiming for adequate sleep  and minimizing stressors.   Follow up: 3 months for recheck GAD and HTN  Orders Placed This Encounter  Procedures   MR Brain W Wo Contrast   CBC with Differential/Platelet   Sedimentation rate   C-reactive protein   No orders of the defined types were placed in this encounter.     BP Readings from Last 3 Encounters:  08/30/21 138/74  07/21/21 138/82  06/22/21 (!) 156/80   Wt Readings from Last 3 Encounters:  08/30/21 134 lb 9.6 oz (61.1 kg)  07/21/21 134 lb 12.8 oz (61.1 kg)  06/22/21 134 lb (60.8 kg)    Lab Results  Component Value Date   CHOL 188 06/22/2021   CHOL 182 08/03/2020   CHOL 183 07/12/2019   Lab Results  Component Value Date   HDL 83.20 06/22/2021   HDL 83.70 08/03/2020   HDL 81.20 07/12/2019   Lab Results  Component Value Date   LDLCALC 78 06/22/2021   LDLCALC 76 08/03/2020   LDLCALC 87 07/12/2019   Lab Results  Component Value Date   TRIG 135.0 06/22/2021   TRIG 109.0 08/03/2020   TRIG 72.0 07/12/2019   Lab Results  Component Value Date   CHOLHDL 2 06/22/2021   CHOLHDL 2 08/03/2020   CHOLHDL 2 07/12/2019   Lab Results  Component Value Date   LDLDIRECT 97.1 05/31/2011   LDLDIRECT 107.3 05/27/2009   Lab Results  Component Value Date   CREATININE 0.71 06/22/2021   BUN 17 06/22/2021   NA 137 06/22/2021   K 4.3 06/22/2021   CL 102 06/22/2021   CO2 29 06/22/2021  The ASCVD Risk score (Arnett DK, et al., 2019) failed to calculate for the following reasons:   The 2019 ASCVD risk score is only valid for ages 46 to 44  I reviewed the patients updated PMH, FH, and SocHx.    Patient Active Problem List   Diagnosis Date Noted   Acquired hypothyroidism 02/14/2019    Priority: High   Chronic prescription benzodiazepine use 02/14/2019    Priority: High   Psychophysiological insomnia 03/26/2018    Priority: High   Essential hypertension 06/17/2015    Priority: High   History of Hashimoto thyroiditis 2017 05/03/2007    Priority:  High   Chronic anxiety 05/03/2007    Priority: High   Osteopenia 08/19/2013    Priority: Medium    Atrophic vaginitis 02/14/2019    Priority: Low   Goiter     Priority: Low   Vitamin D deficiency 06/17/2014    Priority: Low   Diverticulosis of large intestine 05/04/2007    Priority: Low   GAD (generalized anxiety disorder) 07/21/2021   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 She has never used smokeless tobacco. She reports that she does not drink alcohol and does not use drugs.  Current Meds  Medication Sig   acetaminophen (TYLENOL) 325 MG tablet Take 650 mg by mouth every 6 (six) hours as needed.   Calcium Carb-Cholecalciferol 600-800 MG-UNIT TABS Take 1 tablet by mouth daily.   docusate sodium (COLACE) 100 MG capsule Take 100 mg by mouth daily as needed. As needed   estradiol (ESTRACE) 0.1 MG/GM vaginal cream Place 2 Applicatorfuls vaginally at bedtime as needed.   LORazepam (ATIVAN) 1 MG tablet Take 0.5-1 tablets (0.5-1 mg total) by mouth 2 (two) times daily as needed for anxiety or sleep.   Multiple Vitamins-Minerals (PRESERVISION AREDS PO) Take 1 tablet by mouth daily.   nebivolol (BYSTOLIC) 2.5 MG tablet Take 1 tablet (2.5 mg total) by mouth daily.   sertraline (ZOLOFT) 25 MG tablet Take 1 tablet (25 mg total) by mouth daily.   SYNTHROID 88 MCG tablet TAKE 1 TABLET BY MOUTH  DAILY IN THE MORNING ON AN  EMPTY STOMACH   Vitamin D, Cholecalciferol, 50 MCG (2000 UT) CAPS Take 1 capsule by mouth daily.   Wheat Dextrin (BENEFIBER DRINK MIX PO) Take by mouth. As needed    Review of Systems: Cardiovascular: negative for chest pain, palpitations, leg swelling, orthopnea Respiratory: negative for SOB, wheezing or persistent  cough Gastrointestinal: negative for abdominal pain Genitourinary: negative for dysuria or gross hematuria  Objective  Vitals: BP 138/74 Comment: home readings  Pulse 62   Temp 98.7 F (37.1 C)   Ht 5\' 7"  (1.702 m)   Wt 134 lb 9.6 oz (61.1 kg)   SpO2 93%   BMI 21.08 kg/m  General: no acute distress  Psych:  Alert and oriented, normal mood and affect HEENT:  Normocephalic, atraumatic, supple neck  Cardiovascular:  RRR without murmur. no edema Respiratory:  Good breath sounds bilaterally, CTAB with normal respiratory effort  Commons side effects, risks, benefits, and alternatives for medications and treatment plan prescribed today were discussed, and the patient expressed understanding of the given instructions. Patient is instructed to call or message via MyChart if he/she has any questions or concerns regarding our treatment plan. No barriers to understanding were identified. We discussed  Red Flag symptoms and signs in detail. Patient expressed understanding regarding what to do in case of urgent or emergency type symptoms.  Medication list was reconciled, printed and provided to the patient in AVS. Patient instructions and summary information was reviewed with the patient as documented in the AVS. This note was prepared with assistance of Dragon voice recognition software. Occasional wrong-word or sound-a-like substitutions may have occurred due to the inherent limitations of voice recognition software  This visit occurred during the SARS-CoV-2 public health emergency.  Safety protocols were in place, including screening questions prior to the visit, additional usage of staff PPE, and extensive cleaning of exam room while observing appropriate contact time as indicated for disinfecting solutions.

## 2021-09-02 ENCOUNTER — Telehealth (INDEPENDENT_AMBULATORY_CARE_PROVIDER_SITE_OTHER): Payer: Medicare Other | Admitting: Family Medicine

## 2021-09-02 VITALS — Ht 67.0 in | Wt 134.0 lb

## 2021-09-02 DIAGNOSIS — F410 Panic disorder [episodic paroxysmal anxiety] without agoraphobia: Secondary | ICD-10-CM | POA: Diagnosis not present

## 2021-09-02 DIAGNOSIS — I10 Essential (primary) hypertension: Secondary | ICD-10-CM | POA: Diagnosis not present

## 2021-09-02 DIAGNOSIS — F411 Generalized anxiety disorder: Secondary | ICD-10-CM

## 2021-09-02 MED ORDER — NEBIVOLOL HCL 2.5 MG PO TABS: 2.5000 mg | ORAL_TABLET | Freq: Every day | ORAL | 3 refills | Status: DC

## 2021-09-02 NOTE — Progress Notes (Signed)
Subjective  CC:  Chief Complaint  Patient presents with   Anxiety    Pt stated that she has had 3 Anxiety attacks and the 3rd one was last night     Virtual Visit via Video Note  HPI:  I connected with Ashley [name] on 09/02/2021 at [facility] at [facility] by a video enabled telemedicine application and verified that I am speaking with the correct person using two identifiers. Location patient: Home Location provider: [employer], Office Persons participating in the virtual visit:Dosha Broshears Mardelle Matte, MD, Trudie Reed, CMA  I discussed the limitations of evaluation and management by telemedicine and the availability of in person appointments. The patient expressed understanding and agreed to proceed. HPI: Ashley [name] is a 81 y.o. female who presents to the office today to address the problems listed above in the chief complaint. Panic attack sxs at night: awoke feeling warm, anxious. Worried about MRI and medications. On sertraline and lorazepam.  Feeling better today.  HTN: taking the full bystolic pill 2.5 mg daily. Bp was high during panic attack but now down to normal.   Assessment  1. GAD (generalized anxiety disorder)   2. Panic attack   3. Essential hypertension      Plan  Anxiety/panic:  counseling done. Continue current medications. Triggers: medications and need for Brain MRI HTN: trying to see if she will tolerate the 2.5mg  dose.   Follow up: as scheduled  11/30/2021  No orders of the defined types were placed in this encounter.  No orders of the defined types were placed in this encounter.     I reviewed the patients updated PMH, FH, and SocHx.    Patient Active Problem List   Diagnosis Date Noted   Acquired hypothyroidism 02/14/2019    Priority: High   Chronic prescription benzodiazepine use 02/14/2019    Priority: High   Psychophysiological insomnia 03/26/2018    Priority: High   Essential hypertension 06/17/2015    Priority: High   History of  Hashimoto thyroiditis 2017 05/03/2007    Priority: High   Chronic anxiety 05/03/2007    Priority: High   Osteopenia 08/19/2013    Priority: Medium    Atrophic vaginitis 02/14/2019    Priority: Low   Goiter     Priority: Low   Vitamin D deficiency 06/17/2014    Priority: Low   Diverticulosis of large intestine 05/04/2007    Priority: Low   GAD (generalized anxiety disorder) 07/21/2021   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   Calcium Carb-Cholecalciferol 600-800 MG-UNIT TABS Take 1 tablet by mouth daily.   docusate sodium (COLACE) 100 MG capsule Take 100 mg by mouth daily as needed. As needed   estradiol (ESTRACE) 0.1 MG/GM vaginal cream Place 2 Applicatorfuls vaginally at bedtime as needed.   LORazepam (ATIVAN) 1 MG tablet Take 0.5-1 tablets (0.5-1 mg total) by mouth 2 (two) times daily as needed for anxiety or sleep.   Multiple Vitamins-Minerals (PRESERVISION AREDS PO) Take 1 tablet by mouth daily.   nebivolol (BYSTOLIC) 2.5 MG tablet Take 1 tablet (2.5 mg total) by mouth daily.   sertraline (ZOLOFT) 25 MG tablet Take 1 tablet (25 mg total) by mouth daily.   SYNTHROID 88 MCG tablet TAKE 1 TABLET BY MOUTH  DAILY IN THE MORNING ON AN  EMPTY STOMACH   Vitamin D, Cholecalciferol,  50 MCG (2000 UT) CAPS Take 1 capsule by mouth daily.   Wheat Dextrin (BENEFIBER DRINK MIX PO) Take by mouth. As needed    Allergies: Patient is allergic to macrobid [nitrofurantoin], alendronate sodium, penicillins, raloxifene, risedronate sodium, shellfish-derived products, citalopram, and latex. Family History: Patient family history includes Breast cancer in her maternal aunt and paternal aunt; Diabetes in an other family member; Ovarian cancer in her mother; Pancreatic cancer in her brother; Prostate cancer in her brother. Social History:  Patient   reports that she has never smoked. She has never used smokeless tobacco. She reports that she does not drink alcohol and does not use drugs.  Review of Systems: Constitutional: Negative for fever malaise or anorexia Cardiovascular: negative for chest pain Respiratory: negative for SOB or persistent cough Gastrointestinal: negative for abdominal pain  Objective  Vitals: Ht 5\' 7"  (1.702 m)   Wt 134 lb (60.8 kg)   BMI 20.99 kg/m  General: no acute distress , A&Ox3 Appears well   Commons side effects, risks, benefits, and alternatives for medications and treatment plan prescribed today were discussed, and the patient expressed understanding of the given instructions. Patient is instructed to call or message via MyChart if he/she has any questions or concerns regarding our treatment plan. No barriers to understanding were identified. We discussed Red Flag symptoms and signs in detail. Patient expressed understanding regarding what to do in case of urgent or emergency type symptoms.  Medication list was reconciled, printed and provided to the patient in AVS. Patient instructions and summary information was reviewed with the patient as documented in the AVS. This note was prepared with assistance of Dragon voice recognition software. Occasional wrong-word or sound-a-like substitutions may have occurred due to the inherent limitations of voice recognition software  This visit occurred during the SARS-CoV-2 public health emergency.  Safety protocols were in place, including screening questions prior to the visit, additional usage of staff PPE, and extensive cleaning of exam room while observing appropriate contact time as indicated for disinfecting solutions.

## 2021-09-13 ENCOUNTER — Other Ambulatory Visit: Payer: Medicare Other

## 2021-09-14 ENCOUNTER — Ambulatory Visit
Admission: RE | Admit: 2021-09-14 | Discharge: 2021-09-14 | Disposition: A | Discharge status: Home / Self Care | Payer: Medicare Other | Source: Ambulatory Visit | Attending: Family Medicine | Admitting: Family Medicine

## 2021-09-14 DIAGNOSIS — H539 Unspecified visual disturbance: Secondary | ICD-10-CM

## 2021-09-14 DIAGNOSIS — R519 Headache, unspecified: Secondary | ICD-10-CM

## 2021-09-14 MED ORDER — GADOBENATE DIMEGLUMINE 529 MG/ML IV SOLN
12.0000 mL | Freq: Once | INTRAVENOUS | Status: AC | PRN
  Administered 2021-09-14: 12 mL via INTRAVENOUS

## 2021-09-15 NOTE — Progress Notes (Signed)
Please call patient: I have reviewed Ashley [name] MRI report and it looks good. There is no concern for a brain problem causing Ashley [name] headaches; I believe they are related to stress and tension as we have discussed. There are signs of aging only.

## 2021-11-04 ENCOUNTER — Telehealth: Payer: Self-pay | Admitting: Family Medicine

## 2021-11-04 NOTE — Telephone Encounter (Signed)
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

## 2021-11-08 ENCOUNTER — Encounter: Payer: Self-pay | Admitting: *Deleted

## 2021-11-08 NOTE — Telephone Encounter (Signed)
Pt has an appt 11/30/2021 and will discuss at that visit.

## 2021-11-30 ENCOUNTER — Ambulatory Visit: Payer: Medicare Other | Admitting: Family Medicine

## 2021-11-30 ENCOUNTER — Encounter: Payer: Self-pay | Admitting: Family Medicine

## 2021-12-14 ENCOUNTER — Telehealth: Payer: Self-pay | Admitting: Family Medicine

## 2021-12-14 NOTE — Telephone Encounter (Signed)
Patient requests to be called at ph# [phone] asap re:  Patient requests to be advised if she can skip taking sertraline (ZOLOFT) 25 MG tablet tonight due to Patient has appointment 12/15/21 with Dr. Jonni Sanger and the above medication is causing Patient to have extremely dry mouth and throat, short of breath and feeling distressed (last night 12/13/21) making it hard for Patient to sleep.

## 2021-12-14 NOTE — Telephone Encounter (Signed)
Message sent to pt to by pass Zoloft for tonight until she can speak with Jonni Sanger at her upcoming appt regarding her symptoms

## 2021-12-15 ENCOUNTER — Ambulatory Visit: Payer: Medicare Other | Admitting: Family Medicine

## 2021-12-15 ENCOUNTER — Encounter: Payer: Self-pay | Admitting: Family Medicine

## 2021-12-15 VITALS — BP 110/64 | HR 69 | Temp 98.0°F | Ht 67.0 in | Wt 134.6 lb

## 2021-12-15 DIAGNOSIS — Z79899 Other long term (current) drug therapy: Secondary | ICD-10-CM

## 2021-12-15 DIAGNOSIS — F411 Generalized anxiety disorder: Secondary | ICD-10-CM

## 2021-12-15 DIAGNOSIS — I10 Essential (primary) hypertension: Secondary | ICD-10-CM

## 2021-12-15 DIAGNOSIS — Z23 Encounter for immunization: Secondary | ICD-10-CM

## 2021-12-15 DIAGNOSIS — R0602 Shortness of breath: Secondary | ICD-10-CM

## 2021-12-15 DIAGNOSIS — F419 Anxiety disorder, unspecified: Secondary | ICD-10-CM | POA: Diagnosis not present

## 2021-12-15 DIAGNOSIS — R682 Dry mouth, unspecified: Secondary | ICD-10-CM | POA: Diagnosis not present

## 2021-12-15 NOTE — Patient Instructions (Signed)
Please return in 6 months for your annual complete physical; please come fasting.     If you have any questions or concerns, please don't hesitate to send me a message via MyChart or call the office at [phone]. Thank you for visiting with [place] today! It's our pleasure caring for you.   Alpha-gal Syndrome Alpha-gal syndrome (AGS) is an allergic reaction to a type of sugar commonly called alpha-gal. It is found in the meat and organ meats of mammals, such as cows, pigs, and sheep. It may also be found in products that come from animals, such as gelatin, medicines, medicine capsules, some milk products, vaccines, and cosmetics. AGS causes an allergic reaction that can be immediate or delayed for several hours and can range from mild to severe. A mild reaction may cause nausea, vomiting, or an itchy rash (hives). A severe reaction can cause breathing difficulties or loss of consciousness (anaphylaxis). This can be life-threatening. What are the causes? This allergy is first triggered by a tick bite from a lone star or blackleg tick. These ticks bite animals, such as cows, pigs, or sheep, and pick up the alpha-gal sugar from their blood. If the same tick bites you, it may cause your body's defense system (immune system) to produce antibodies to alpha-gal and cause the allergic reaction. What increases the risk? People who live in areas of the [place] where the lone star tick is common are at highest risk, these areas may include: Southeastern. [place]. [place] into parts of [place]. People who are hunters and people who work in jobs that involve caring for trees (foresters) have an increased risk of this condition. What are the signs or symptoms? AGS may not cause an allergic reaction every time you eat red meat or come into contact with alpha-gal. If you do have a reaction, symptoms may include: Hives. Severe stomachache. Nausea or vomiting. Swelling of the lips, face, tongue,  or throat. Making a high-pitched whistling sound when you breathe, most often when you breathe out (wheezing). Sneezing and runny nose. Headache. Symptoms of anaphylaxis may include: Difficulty breathing. Difficulty swallowing. Dizziness. Fainting. This may happen due to a sudden drop in blood pressure. You may have AGS if you had anaphylaxis after eating something but do not have any known food allergies. Unlike other food allergies, the reaction does not start soon after the exposure to alpha-gal. There may be a delay of several hours. How is this diagnosed? This condition may be diagnosed based on signs and symptoms of the condition, especially if you have a history of tick bites and a delayed reaction to red meat. You may also have a blood test to check for antibodies to alpha-gal or a skin test to see if there is a reaction to alpha-gal. How is this treated? This condition may be treated by: Avoiding meat and organ meats that may contain alpha-gal. Avoiding medicines or other products that may contain alpha-gal. Using medicines to reduce an allergic reaction. Carrying an epinephrine auto-injector to use in case of a severe AGS reaction. AGS should be treated by an allergist or a health care provider who has experience with AGS. Follow these instructions at home:  Medicines Take over-the-counter and prescription medicines only as told by your health care provider. Follow instructions from your health care provider about when and how to use an epinephrine auto-injector. General instructions Avoid red meat and organ meat, and check food labels for meat-based ingredients in packaged foods such as soup, gravy, and  flavoring. Work with your allergist to find what other foods or products you may need to avoid, some people may benefit from avoiding dairy. Keep all follow-up visits. This is important. How is this prevented? Take steps to prevent tick bites as frequent tick bites may increase  the risk of an AGS reaction. These steps include: Avoiding woods and fields with high grass. Wearing clothing protected with the anti-tick chemical permethrin when outdoors in these areas or using a U.S. Environmental Protection Agency-approved insect repellent. Checking your clothing for ticks before you come back indoors. Checking your body for ticks when you take a shower. Checking your pets for ticks. Where to find more information Centers for Disease Control and Prevention: http://www.wolf.info/ National Institute of Allergy and Infectious Diseases: https://www.jennings-kim.com/ Contact a health care provider if: You have any signs or symptoms of AGS food allergy. You have a tick bite that causes a skin reaction. Get help right away if: You have a severe AGS reaction. You have trouble swallowing or breathing. These symptoms may represent a serious problem that is an emergency. Do not wait to see if the symptoms will go away. Get medical help right away. Call your local emergency services (911 in the [place] Do not drive yourself to the hospital. Summary AGS is a food allergy caused by a tick bite. Red meats, organ meats, and other products or medicines may trigger the alpha-gal reaction. AGS reactions can be mild or severe. If you have AGS, you should not eat red meat, organ meat, or products that may contain alpha-gal. Avoiding tick bites prevents AGS and more serious AGS reactions. This information is not intended to replace advice given to you by your health care provider. Make sure you discuss any questions you have with your health care provider. Document Revised: 05/19/2020 Document Reviewed: 05/19/2020 Elsevier Patient Education  [place].

## 2021-12-15 NOTE — Progress Notes (Signed)
Subjective  CC:  Chief Complaint  Patient presents with   Hypertension   Anxiety    HPI: Ashley [name] is a 81 y.o. female who presents to the office today to address the problems listed above in the chief complaint. 81 year old presents with her daughter today to discuss her continued concerns regarding some of her symptoms.  Overall we are treating her anxiety with Zoloft 25 mg daily.  She feels that this makes her mouth very dry.  However she does report that it does help her anxiety symptoms.  She is no longer having tension-like headaches.  She continues to awaken at night feeling short of breath.  She is concerned that this may be related to a tick bite she had 10 years ago since she has read about alpha gal syndrome.  She has no history of anaphylaxis or hives.  She complains of burning in the skin at times.  No rash.  She takes her Ativan nightly. Her hypertension is well controlled with Bystolic 2.5 mg daily.  She is tolerating this finally.  No longer having very high elevated readings at home.  She feels good about this  Assessment  1. SOB (shortness of breath)   2. Dry mouth   3. Essential hypertension   4. Chronic anxiety   5. Chronic prescription benzodiazepine use   6. GAD (generalized anxiety disorder)   7. Need for immunization against influenza      Plan  Shortness of breath and dry mouth: Given history of tick bite, we discussed that it is unlikely that she has alpha gal syndrome but she and her daughter would like it tested.  I did report that it is a slightly difficult diagnosis to make but we will start with the blood work. Anxiety: Doing well on sertraline 25 mg daily.  Would like to increase to 50 mg but will do this slowly and cautiously.  For now, recommended changing her a.m. medications to be taken together. Continue thyroid medicines daily Continue Bystolic 2.5 mg daily.  Blood pressure is looking much better.  She is very sensitive medications  Follow up:  As scheduled 04/25/2022  Orders Placed This Encounter  Procedures   Flu Vaccine QUAD High Dose(Fluad)   Alpha-Gal Panel   No orders of the defined types were placed in this encounter.     I reviewed the patients updated PMH, FH, and SocHx.    Patient Active Problem List   Diagnosis Date Noted   Acquired hypothyroidism 02/14/2019    Priority: High   Chronic prescription benzodiazepine use 02/14/2019    Priority: High   Psychophysiological insomnia 03/26/2018    Priority: High   Essential hypertension 06/17/2015    Priority: High   History of Hashimoto thyroiditis 2017 05/03/2007    Priority: High   Chronic anxiety 05/03/2007    Priority: High   Osteopenia 08/19/2013    Priority: Medium    Atrophic vaginitis 02/14/2019    Priority: Low   Goiter     Priority: Low   Vitamin D deficiency 06/17/2014    Priority: Low   Diverticulosis of large intestine 05/04/2007    Priority: Low   GAD (generalized anxiety disorder) 07/21/2021   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   Calcium Carb-Cholecalciferol 600-800 MG-UNIT TABS Take 1 tablet by mouth daily.   docusate  sodium (COLACE) 100 MG capsule Take 100 mg by mouth daily as needed. As needed   estradiol (ESTRACE) 0.1 MG/GM vaginal cream Place 2 Applicatorfuls vaginally at bedtime as needed.   LORazepam (ATIVAN) 1 MG tablet Take 0.5-1 tablets (0.5-1 mg total) by mouth 2 (two) times daily as needed for anxiety or sleep.   Multiple Vitamins-Minerals (PRESERVISION AREDS PO) Take 1 tablet by mouth daily.   nebivolol (BYSTOLIC) 2.5 MG tablet Take 1 tablet (2.5 mg total) by mouth daily.   sertraline (ZOLOFT) 25 MG tablet Take 1 tablet (25 mg total) by mouth daily.   SYNTHROID 88 MCG tablet TAKE 1 TABLET BY MOUTH  DAILY IN THE MORNING ON AN  EMPTY STOMACH   Vitamin D, Cholecalciferol,  50 MCG (2000 UT) CAPS Take 1 capsule by mouth daily.   Wheat Dextrin (BENEFIBER DRINK MIX PO) Take by mouth. As needed    Allergies: Patient is allergic to macrobid [nitrofurantoin], alendronate sodium, penicillins, raloxifene, risedronate sodium, shellfish-derived products, citalopram, and latex. Family History: Patient family history includes Breast cancer in her maternal aunt and paternal aunt; Diabetes in an other family member; Ovarian cancer in her mother; Pancreatic cancer in her brother; Prostate cancer in her brother. Social History:  Patient  reports that she has never smoked. She has never used smokeless tobacco. She reports that she does not drink alcohol and does not use drugs.  Review of Systems: Constitutional: Negative for fever malaise or anorexia Cardiovascular: negative for chest pain Respiratory: negative for SOB or persistent cough Gastrointestinal: negative for abdominal pain  Objective  Vitals: BP 110/64   Pulse 69   Temp 98 F (36.7 C)   Ht 5\' 7"  (1.702 m)   Wt 134 lb 9.6 oz (61.1 kg)   SpO2 98%   BMI 21.08 kg/m  General: no acute distress , A&Ox3 HEENT: PEERL, conjunctiva normal, neck is supple Cardiovascular:  RRR without murmur or gallop.  Respiratory:  Good breath sounds bilaterally, CTAB with normal respiratory effort Skin:  Warm, no rashes    Commons side effects, risks, benefits, and alternatives for medications and treatment plan prescribed today were discussed, and the patient expressed understanding of the given instructions. Patient is instructed to call or message via MyChart if he/she has any questions or concerns regarding our treatment plan. No barriers to understanding were identified. We discussed Red Flag symptoms and signs in detail. Patient expressed understanding regarding what to do in case of urgent or emergency type symptoms.  Medication list was reconciled, printed and provided to the patient in AVS. Patient instructions and summary  information was reviewed with the patient as documented in the AVS. This note was prepared with assistance of Dragon voice recognition software. Occasional wrong-word or sound-a-like substitutions may have occurred due to the inherent limitations of voice recognition software  This visit occurred during the SARS-CoV-2 public health emergency.  Safety protocols were in place, including screening questions prior to the visit, additional usage of staff PPE, and extensive cleaning of exam room while observing appropriate contact time as indicated for disinfecting solutions.

## 2022-02-10 ENCOUNTER — Telehealth: Payer: Self-pay | Admitting: Family Medicine

## 2022-02-10 NOTE — Telephone Encounter (Signed)
Patient states: -Took BP three times this morning and each reading got higher  7:30am: 168/95  9am: 175/83 (Took BP medication, Bystolic 2.5 mg )  9:37 am : 193/86, while on the phone with me - Her eyes and head feel heavy  - HR has been decreasing  Patient disconnected while on hold for triage. Triage informed me they will call patient back.

## 2022-02-10 NOTE — Telephone Encounter (Signed)
Patient Name: Ashley [name] Gender: Female DOB: 11/29/1940 Age: 81 Y 20 M Return Phone Number: [phone] (Primary) Address: City/ State/ Zip: [place]  [address] Client [facility] - [facility], [facility] Provider Asencion Partridge- MD Contact Type Call Who Is Calling Patient / Member / Family / Caregiver Call Type Triage / Clinical Relationship To Patient Self Return Phone Number [phone] (Primary) Chief Complaint Headache Reason for Call Symptomatic / Request for Health Information Initial Comment Caller has 193/86 elevated BP with heaviness in eyes and head. (Office is relaying pt info but caller is not longer on the line to hear end scripting.) Translation No Nurse Assessment Nurse: Scarlette Ar, RN, Herbert Seta Date/Time (Eastern Time): 02/10/2022 10:06:14 AM Confirm and document reason for call. If symptomatic, describe symptoms. ---Caller has 193/86 elevated BP with heaviness in eyes and head. Does the patient have any new or worsening symptoms? ---Yes Will a triage be completed? ---Yes Related visit to physician within the last 2 weeks? ---No Does the PT have any chronic conditions? (i.e. diabetes, asthma, this includes High risk factors for pregnancy, etc.) ---Yes List chronic conditions. ---thyroid, HTN Is this a behavioral health or substance abuse call? ---No Guidelines Guideline Title Affirmed Question Affirmed Notes Nurse Date/Time (Eastern Time) Blood Pressure - High [1] Systolic BP >= 130 OR Diastolic >= 80 AND [2] taking BP medications Standifer, RN, Heather 02/10/2022 10:11:32 AM Disp. Time Lamount Cohen Time) Disposition Final User 02/10/2022 10:18:59 AM See PCP within 2 Weeks Yes Standifer, RN, Herbert Seta PLEASE NOTE: All timestamps contained within this report are represented as [place] Standard Time. CONFIDENTIALTY NOTICE: This fax transmission is intended only for the  addressee. It contains information that is legally privileged, confidential or otherwise protected from use or disclosure. If you are not the intended recipient, you are strictly prohibited from reviewing, disclosing, copying using or disseminating any of this information or taking any action in reliance on or regarding this information. If you have received this fax in error, please notify us immediately by telephone so that we can arrange for its return to [place]. Phone: [phone], Toll-Free: [phone], Fax: [phone] Page: 2 of 2 Call Id: [id number] Final Disposition 02/10/2022 10:18:59 AM See PCP within 2 Weeks Yes Standifer, RN, Sibyl Parr Disagree/Comply Comply Caller Understands Yes PreDisposition Call Doctor Care Advice Given Per Guideline SEE PCP WITHIN 2 WEEKS: * You need to be seen for this ongoing problem within the next 2 weeks. * You become worse CALL BACK IF: * Weakness or numbness of the face, arm or leg on one side of the body occurs * Difficulty walking, difficulty talking, or severe headache occurs * Chest pain or difficulty breathing occurs * Your blood pressure is over 160/100 CARE ADVICE given per High Blood Pressure (Adult) guideline.

## 2022-02-16 ENCOUNTER — Encounter: Payer: Self-pay | Admitting: Family Medicine

## 2022-02-16 ENCOUNTER — Ambulatory Visit: Payer: Medicare Other | Admitting: Family Medicine

## 2022-02-16 VITALS — BP 170/80 | HR 61 | Temp 98.0°F | Ht 67.0 in | Wt 134.8 lb

## 2022-02-16 DIAGNOSIS — I10 Essential (primary) hypertension: Secondary | ICD-10-CM

## 2022-02-16 DIAGNOSIS — Z91018 Allergy to other foods: Secondary | ICD-10-CM

## 2022-02-16 DIAGNOSIS — F419 Anxiety disorder, unspecified: Secondary | ICD-10-CM | POA: Diagnosis not present

## 2022-02-16 NOTE — Progress Notes (Signed)
Subjective  CC:  Chief Complaint  Patient presents with   Hypertension    This was when pt first called in on 02/10/2022 193/86 elevated BP with heaviness in eyes and head.     HPI: Ashley [name] is a 82 y.o. female who presents to the office today to address the problems listed above in the chief complaint. Hypertension f/u: 82 year old with history of orthostatic symptoms and autonomic dysfunction with hypertension, on Bystolic 2.5 mg daily.  Typically runs in the high normal range at home but has had some elevated readings.  This occurred December 28, she had been enjoying holidays food, otherwise was feeling well but described a heaviness in her head.  Not a true headache.  No neurologic symptoms.  Blood pressure stayed up for several hours but then returned closer to normal.  Headache only lasted an hour.  No chest pain shortness of breath palpitations lower extremity edema.  She feels normal today.  She denies stressors Food allergies: See last note.  Would like to be tested for alpha gal syndrome.  Also wonders if she is allergic to beef and seafood.  She does describe history of rash after seafood.  Occasionally will get shortness of breath after beef.  No history of anaphylaxis  Assessment  1. Essential hypertension   2. Food allergy   3. Chronic anxiety      Plan   Hypertension f/u: BP control is fairly well controlled.  Blood pressure with systolic mildly elevated today.  No red flag symptoms given her history of orthostatic symptoms and intolerance to many medications, her goal blood pressure is 140-160/80-100.  She will continue to monitor at home intermittently.  She does have an anxiety response.  If blood pressures do not normalize, will increase Bystolic to 5 mg daily.  Patient agrees Check for food allergies and alpha gal Continue chronic anxiety medicines, sertraline 25 mg daily.  Nighttime benzodiazepine  Education regarding management of these chronic disease states  was given. Management strategies discussed on successive visits include dietary and exercise recommendations, goals of achieving and maintaining IBW, and lifestyle modifications aiming for adequate sleep and minimizing stressors.   Follow up: 2 to 3 months for recheck  Orders Placed This Encounter  Procedures   Food Allergy Profile   Alpha-Gal Panel   No orders of the defined types were placed in this encounter.     BP Readings from Last 3 Encounters:  02/16/22 (!) 170/80  12/15/21 110/64  08/30/21 138/74   Wt Readings from Last 3 Encounters:  02/16/22 134 lb 12.8 oz (61.1 kg)  12/15/21 134 lb 9.6 oz (61.1 kg)  09/02/21 134 lb (60.8 kg)    Lab Results  Component Value Date   CHOL 188 06/22/2021   CHOL 182 08/03/2020   CHOL 183 07/12/2019   Lab Results  Component Value Date   HDL 83.20 06/22/2021   HDL 83.70 08/03/2020   HDL 81.20 07/12/2019   Lab Results  Component Value Date   LDLCALC 78 06/22/2021   LDLCALC 76 08/03/2020   LDLCALC 87 07/12/2019   Lab Results  Component Value Date   TRIG 135.0 06/22/2021   TRIG 109.0 08/03/2020   TRIG 72.0 07/12/2019   Lab Results  Component Value Date   CHOLHDL 2 06/22/2021   CHOLHDL 2 08/03/2020   CHOLHDL 2 07/12/2019   Lab Results  Component Value Date   LDLDIRECT 97.1 05/31/2011   LDLDIRECT 107.3 05/27/2009   Lab Results  Component Value  Date   CREATININE 0.71 06/22/2021   BUN 17 06/22/2021   NA 137 06/22/2021   K 4.3 06/22/2021   CL 102 06/22/2021   CO2 29 06/22/2021    The ASCVD Risk score (Arnett DK, et al., 2019) failed to calculate for the following reasons:   The 2019 ASCVD risk score is only valid for ages 74 to 108  I reviewed the patients updated PMH, FH, and SocHx.    Patient Active Problem List   Diagnosis Date Noted   Acquired hypothyroidism 02/14/2019    Priority: High   Chronic prescription benzodiazepine use 02/14/2019    Priority: High   Psychophysiological insomnia 03/26/2018     Priority: High   Essential hypertension 06/17/2015    Priority: High   History of Hashimoto thyroiditis 2017 05/03/2007    Priority: High   Chronic anxiety 05/03/2007    Priority: High   Osteopenia 08/19/2013    Priority: Medium    Atrophic vaginitis 02/14/2019    Priority: Low   Goiter     Priority: Low   Vitamin D deficiency 06/17/2014    Priority: Low   Diverticulosis of large intestine 05/04/2007    Priority: Low   GAD (generalized anxiety disorder) 07/21/2021   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 She has never used smokeless tobacco. She reports that she does not drink alcohol and does not use drugs.  Current Meds  Medication Sig   acetaminophen (TYLENOL) 325 MG tablet Take 650 mg by mouth every 6 (six) hours as needed.   Calcium Carb-Cholecalciferol 600-800 MG-UNIT TABS Take 1 tablet by mouth daily.   docusate sodium (COLACE) 100 MG capsule Take 100 mg by mouth daily as needed. As needed   estradiol (ESTRACE) 0.1 MG/GM vaginal cream Place 2 Applicatorfuls vaginally at bedtime as needed.   LORazepam (ATIVAN) 1 MG tablet Take 0.5-1 tablets (0.5-1 mg total) by mouth 2 (two) times daily as needed for anxiety or sleep.   Multiple Vitamins-Minerals (PRESERVISION AREDS PO) Take 1 tablet by mouth daily.   nebivolol (BYSTOLIC) 2.5 MG tablet Take 1 tablet (2.5 mg total) by mouth daily.   sertraline (ZOLOFT) 25 MG tablet Take 1 tablet (25 mg total) by mouth daily.   SYNTHROID 88 MCG tablet TAKE 1 TABLET BY MOUTH  DAILY IN THE MORNING ON AN  EMPTY STOMACH   Vitamin D, Cholecalciferol, 50 MCG (2000 UT) CAPS Take 1 capsule by mouth daily.   Wheat Dextrin (BENEFIBER DRINK MIX PO) Take by mouth. As needed    Review of  Systems: Cardiovascular: negative for chest pain, palpitations, leg swelling, orthopnea Respiratory: negative for SOB, wheezing or persistent cough Gastrointestinal: negative for abdominal pain Genitourinary: negative for dysuria or gross hematuria  Objective  Vitals: BP (!) 170/80   Pulse 61   Temp 98 F (36.7 C)   Ht 5\' 7"  (1.702 m)   Wt 134 lb 12.8 oz (61.1 kg)   SpO2 95%   BMI 21.11 kg/m  General: no acute distress  Psych:  Alert and oriented, normal mood and affect HEENT:  Normocephalic, atraumatic, supple neck  Cardiovascular:  RRR without murmur. no edema Respiratory:  Good breath sounds bilaterally, CTAB with normal respiratory effort Skin:  Warm, no rashes Neurologic:   Mental status is normal Commons side effects, risks, benefits, and alternatives for medications  and treatment plan prescribed today were discussed, and the patient expressed understanding of the given instructions. Patient is instructed to call or message via MyChart if he/she has any questions or concerns regarding our treatment plan. No barriers to understanding were identified. We discussed Red Flag symptoms and signs in detail. Patient expressed understanding regarding what to do in case of urgent or emergency type symptoms.  Medication list was reconciled, printed and provided to the patient in AVS. Patient instructions and summary information was reviewed with the patient as documented in the AVS. This note was prepared with assistance of Dragon voice recognition software. Occasional wrong-word or sound-a-like substitutions may have occurred due to the inherent limitation

## 2022-02-16 NOTE — Patient Instructions (Signed)
Please return in 2-3 months for recheck.   I will release your lab results to you on your MyChart account with further instructions. You may see the results before I do, but when I review them I will send you a message with my report or have my assistant call you if things need to be discussed. Please reply to my message with any questions. Thank you!   If you have any questions or concerns, please don't hesitate to send me a message via MyChart or call the office at [phone]. Thank you for visiting with [place] today! It's our pleasure caring for you.

## 2022-02-19 LAB — FOOD ALLERGY PROFILE
Allergen, Salmon, f41: <0.1 kU/L
Almonds: <0.1 kU/L
CLASS: 0
CLASS: 0
CLASS: 0
CLASS: 0
CLASS: 0
CLASS: 0
CLASS: 0
CLASS: 0
CLASS: 0
CLASS: 0
CLASS: 0
Cashew IgE: <0.1 kU/L
Class: 0
Class: 0
Class: 0
Class: 0
Egg White IgE: <0.1 kU/L
Fish Cod: <0.1 kU/L
Hazelnut: <0.1 kU/L
Milk IgE: <0.1 kU/L
Peanut IgE: <0.1 kU/L
Scallop IgE: <0.1 kU/L
Sesame Seed f10: <0.1 kU/L
Shrimp IgE: <0.1 kU/L
Soybean IgE: <0.1 kU/L
Tuna IgE: <0.1 kU/L
Walnut: <0.1 kU/L
Wheat IgE: <0.1 kU/L

## 2022-02-19 LAB — ALPHA-GAL PANEL
Allergen, Mutton, f88: <0.1 kU/L
Allergen, Pork, f26: <0.1 kU/L
Beef: <0.1 kU/L
CLASS: 0
CLASS: 0
Class: 0
GALACTOSE-ALPHA-1,3-GALACTOSE IGE*: <0.1 kU/L (ref <0.10)

## 2022-02-19 LAB — INTERPRETATION:

## 2022-03-18 ENCOUNTER — Other Ambulatory Visit: Payer: Self-pay | Admitting: Family Medicine

## 2022-03-18 DIAGNOSIS — F411 Generalized anxiety disorder: Secondary | ICD-10-CM

## 2022-03-21 ENCOUNTER — Ambulatory Visit: Payer: Medicare Other | Admitting: Family Medicine

## 2022-03-25 ENCOUNTER — Other Ambulatory Visit: Payer: Self-pay | Admitting: Family Medicine

## 2022-04-11 ENCOUNTER — Encounter: Payer: Self-pay | Admitting: Family Medicine

## 2022-04-11 ENCOUNTER — Ambulatory Visit: Payer: Medicare Other | Admitting: Family Medicine

## 2022-04-11 VITALS — BP 170/88 | HR 69 | Temp 98.5°F | Ht 67.0 in | Wt 134.2 lb

## 2022-04-11 DIAGNOSIS — G8929 Other chronic pain: Secondary | ICD-10-CM

## 2022-04-11 DIAGNOSIS — I10 Essential (primary) hypertension: Secondary | ICD-10-CM

## 2022-04-11 DIAGNOSIS — F419 Anxiety disorder, unspecified: Secondary | ICD-10-CM

## 2022-04-11 DIAGNOSIS — R519 Headache, unspecified: Secondary | ICD-10-CM

## 2022-04-11 NOTE — Progress Notes (Signed)
Subjective  CC:  Chief Complaint  Patient presents with   Hypertension    HPI: Ashley [name] is a 82 y.o. female who presents to the office today to address the problems listed above in the chief complaint. Hypertension f/u: hasn't checked at home. Was doing well until last week; awakening with headaches again. Chronic complaint. Brain MRI 09/2021 done for headaches was negative for abnormalities (sm vessel disease and atrophy only). HA lasts about 15 minutes. Says responds to bp medications and lying down.  Alpha gal testing and food allergy panel negative. Pt reports fast heart beat if eats meat or has dairy. Anxiety: wonders if sertraline is causing side effects but feels it helps anxiety. Says she was doing well up until last week.   Assessment  1. Essential hypertension   2. Chronic nonintractable headache, unspecified headache type   3. Chronic anxiety      Plan   Hypertension f/u: BP control is fairly well controlled. Do to h/o orthostatic hypotension/autonomic and sensitivity to meds: allow 140-160/80-100. Discussed trial of bystolic '5mg'$  daily. Pt feels it made her headaches worse in the past. She may try it again.  Continue sertraline.  No red flag sxs to headaches present.   Education regarding management of these chronic disease states was given. Management strategies discussed on successive visits include dietary and exercise recommendations, goals of achieving and maintaining IBW, and lifestyle modifications aiming for adequate sleep and minimizing stressors.   Follow up: No follow-ups on file.  No orders of the defined types were placed in this encounter.  No orders of the defined types were placed in this encounter.     BP Readings from Last 3 Encounters:  04/11/22 (!) 170/88  02/16/22 (!) 170/80  12/15/21 110/64   Wt Readings from Last 3 Encounters:  04/11/22 134 lb 3.2 oz (60.9 kg)  02/16/22 134 lb 12.8 oz (61.1 kg)  12/15/21 134 lb 9.6 oz (61.1 kg)     Lab Results  Component Value Date   CHOL 188 06/22/2021   CHOL 182 08/03/2020   CHOL 183 07/12/2019   Lab Results  Component Value Date   HDL 83.20 06/22/2021   HDL 83.70 08/03/2020   HDL 81.20 07/12/2019   Lab Results  Component Value Date   LDLCALC 78 06/22/2021   LDLCALC 76 08/03/2020   LDLCALC 87 07/12/2019   Lab Results  Component Value Date   TRIG 135.0 06/22/2021   TRIG 109.0 08/03/2020   TRIG 72.0 07/12/2019   Lab Results  Component Value Date   CHOLHDL 2 06/22/2021   CHOLHDL 2 08/03/2020   CHOLHDL 2 07/12/2019   Lab Results  Component Value Date   LDLDIRECT 97.1 05/31/2011   LDLDIRECT 107.3 05/27/2009   Lab Results  Component Value Date   CREATININE 0.71 06/22/2021   BUN 17 06/22/2021   NA 137 06/22/2021   K 4.3 06/22/2021   CL 102 06/22/2021   CO2 29 06/22/2021    The ASCVD Risk score (Arnett DK, et al., 2019) failed to calculate for the following reasons:   The 2019 ASCVD risk score is only valid for ages 38 to 21  I reviewed the patients updated PMH, FH, and SocHx.    Patient Active Problem List   Diagnosis Date Noted   Acquired hypothyroidism 02/14/2019    Priority: High   Chronic prescription benzodiazepine use 02/14/2019    Priority: High   Psychophysiological insomnia 03/26/2018    Priority: High   Essential hypertension  06/17/2015    Priority: High   History of Hashimoto thyroiditis 2017 05/03/2007    Priority: High   Chronic anxiety 05/03/2007    Priority: High   Osteopenia 08/19/2013    Priority: Medium    Atrophic vaginitis 02/14/2019    Priority: Low   Goiter     Priority: Low   Vitamin D deficiency 06/17/2014    Priority: Low   Diverticulosis of large intestine 05/04/2007    Priority: Low   Chronic nonintractable headache 04/11/2022   GAD (generalized anxiety disorder) 07/21/2021   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 She has never used smokeless tobacco. She reports that she does not drink alcohol and does not use drugs.  Current Meds  Medication Sig   acetaminophen (TYLENOL) 325 MG tablet Take 650 mg by mouth every 6 (six) hours as needed.   Calcium Carb-Cholecalciferol 600-800 MG-UNIT TABS Take 1 tablet by mouth daily.   docusate sodium (COLACE) 100 MG capsule Take 100 mg by mouth daily as needed. As needed   estradiol (ESTRACE) 0.1 MG/GM vaginal cream Place 2 Applicatorfuls vaginally at bedtime as needed.   LORazepam (ATIVAN) 1 MG tablet TAKE 1/2 TO 1 TABLET(0.5 TO 1 MG) BY MOUTH TWICE DAILY AS NEEDED FOR ANXIETY OR SLEEP   Multiple Vitamins-Minerals (PRESERVISION AREDS PO) Take 1 tablet by mouth daily.   nebivolol (BYSTOLIC) 2.5 MG tablet Take 1 tablet (2.5 mg total) by mouth daily.   sertraline (ZOLOFT) 25 MG tablet Take 1 tablet (25 mg total) by mouth daily.   SYNTHROID 88 MCG tablet TAKE 1 TABLET BY MOUTH DAILY IN  THE MORNING ON AN EMPTY STOMACH   Vitamin D, Cholecalciferol, 50 MCG (2000 UT) CAPS Take 1 capsule by mouth daily.   Wheat Dextrin (BENEFIBER DRINK MIX PO) Take by mouth. As needed    Review of Systems: Cardiovascular: negative for chest pain, palpitations, leg swelling, orthopnea Respiratory: negative for SOB, wheezing or persistent cough Gastrointestinal: negative for abdominal pain Genitourinary: negative for dysuria or gross hematuria  Objective  Vitals: BP (!) 170/88 Comment: right arm, lying down  Pulse 69   Temp 98.5 F (36.9 C)   Ht '5\' 7"'$  (1.702 m)   Wt 134 lb 3.2 oz (60.9 kg)   SpO2 98%   BMI 21.02 kg/m  General: no acute distress  Psych:  Alert and oriented, normal mood and affect HEENT:  Normocephalic, atraumatic, supple neck  Cardiovascular:  RRR without murmur. no edema Respiratory:  Good breath sounds  bilaterally, CTAB with normal respiratory effort  Commons side effects, risks, benefits, and alternatives for medications and treatment plan prescribed today were discussed, and the patient expressed understanding of the given instructions. Patient is instructed to call or message via MyChart if he/she has any questions or concerns regarding our treatment plan. No barriers to understanding were identified. We discussed Red Flag symptoms and signs in detail. Patient expressed understanding regarding what to do in case of urgent or emergency type symptoms.  Medication list was reconciled, printed and provided to the patient in AVS. Patient instructions and summary information was reviewed with the patient as documented in the AVS. This note was prepared with assistance of Dragon voice recognition software. Occasional wrong-word or sound-a-like substitutions may have occurred due to the inherent limitation

## 2022-04-11 NOTE — Patient Instructions (Signed)
Please return in May 2024 for your physical.  If you have any questions or concerns, please don't hesitate to send me a message via MyChart or call the office at [phone]. Thank you for visiting with [place] today! It's our pleasure caring for you.

## 2022-04-12 LAB — HM MAMMOGRAPHY

## 2022-04-12 LAB — HM DEXA SCAN

## 2022-04-26 ENCOUNTER — Ambulatory Visit (INDEPENDENT_AMBULATORY_CARE_PROVIDER_SITE_OTHER): Payer: Medicare Other

## 2022-04-26 ENCOUNTER — Encounter: Payer: Self-pay | Admitting: Family Medicine

## 2022-04-26 VITALS — BP 118/82 | HR 76 | Temp 97.7°F | Wt 134.8 lb

## 2022-04-26 DIAGNOSIS — Z0000 Encounter for general adult medical examination without abnormal findings: Secondary | ICD-10-CM | POA: Diagnosis not present

## 2022-04-26 NOTE — Progress Notes (Signed)
Subjective:   Ashley [name] is a 82 y.o. female who presents for Medicare Annual (Subsequent) preventive examination.  Review of Systems     Cardiac Risk Factors include: advanced age (>57mn, >>103women);hypertension     Objective:    Today's Vitals   04/26/22 1120  BP: 118/82  Pulse: 76  Temp: 97.7 F (36.5 C)  SpO2: 92%  Weight: 134 lb 12.8 oz (61.1 kg)   Body mass index is 21.11 kg/m.     04/26/2022   11:48 AM 04/13/2021   10:23 AM 12/16/2020   11:43 AM 04/30/2019   12:43 PM 12/28/2017    9:53 AM 12/26/2016    8:41 AM 12/26/2016    8:36 AM  Advanced Directives  Does Patient Have a Medical Advance Directive? Yes Yes No Yes Yes  Yes  Type of [place]    Does patient want to make changes to medical advance directive?    No - Patient declined  Yes (MAU/Ambulatory/Procedural Areas - Information given) No - Patient declined  Copy of [place]? No - copy requested No - copy requested  No - copy requested     Would patient like information on creating a medical advance directive?   No - Patient declined        Current Medications (verified) Outpatient Encounter Medications as of 04/26/2022  Medication Sig   acetaminophen (TYLENOL) 325 MG tablet Take 650 mg by mouth every 6 (six) hours as needed.   Calcium Carb-Cholecalciferol 600-800 MG-UNIT TABS Take 1 tablet by mouth daily.   docusate sodium (COLACE) 100 MG capsule Take 100 mg by mouth daily as needed. As needed   estradiol (ESTRACE) 0.1 MG/GM vaginal cream Place 2 Applicatorfuls vaginally at bedtime as needed.   LORazepam (ATIVAN) 1 MG tablet TAKE 1/2 TO 1 TABLET(0.5 TO 1 MG) BY MOUTH TWICE DAILY AS NEEDED FOR ANXIETY OR SLEEP   Multiple Vitamins-Minerals (PRESERVISION AREDS PO) Take 1 tablet by mouth daily.   nebivolol (BYSTOLIC) 2.5 MG tablet Take 1 tablet (2.5 mg total) by  mouth daily.   sertraline (ZOLOFT) 25 MG tablet Take 1 tablet (25 mg total) by mouth daily.   SYNTHROID 88 MCG tablet TAKE 1 TABLET BY MOUTH DAILY IN  THE MORNING ON AN EMPTY STOMACH   Vitamin D, Cholecalciferol, 50 MCG (2000 UT) CAPS Take 1 capsule by mouth daily.   Wheat Dextrin (BENEFIBER DRINK MIX PO) Take by mouth. As needed   No facility-administered encounter medications on file as of 04/26/2022.    Allergies (verified) Macrobid [nitrofurantoin], Alendronate sodium, Penicillins, Raloxifene, Risedronate sodium, Shellfish-derived products, Citalopram, and Latex   History: Past Medical History:  Diagnosis Date   Diverticulosis of colon (without mention of hemorrhage)    Hypertension    Osteoporosis, unspecified    Personal history of other malignant neoplasm of skin    Unspecified hypothyroidism    Past Surgical History:  Procedure Laterality Date   COLONOSCOPY     uterine polyp removal     Family History  Problem Relation Age of Onset   Ovarian cancer Mother    Pancreatic cancer Brother    Diabetes Other    Prostate cancer Brother    Breast cancer Maternal Aunt    Breast cancer Paternal Aunt    Colon cancer Neg Hx    Esophageal cancer Neg Hx    Stomach cancer Neg  Hx    Liver disease Neg Hx    Social History   Socioeconomic History   Marital status: Married    Spouse name: Herbie [place]   Number of children: 2   Years of education: Not on file   Highest education level: Not on file  Occupational History   Occupation: Retired  Tobacco Use   Smoking status: Never   Smokeless tobacco: Never  Vaping Use   Vaping Use: Never used  Substance and Sexual Activity   Alcohol use: No   Drug use: No   Sexual activity: Yes  Other Topics Concern   Not on file  Social History Narrative   Not on file   Social Determinants of Health   Financial Resource Strain: Low Risk  (04/26/2022)   Overall Financial Resource Strain (CARDIA)    Difficulty of Paying Living Expenses: Not  hard at all  Food Insecurity: No Food Insecurity (04/26/2022)   Hunger Vital Sign    Worried About Running Out of Food in the Last Year: Never true    [place] in the Last Year: Never true  Transportation Needs: No Transportation Needs (04/26/2022)   PRAPARE - Hydrologist (Medical): No    Lack of Transportation (Non-Medical): No  Physical Activity: Insufficiently Active (04/26/2022)   Exercise Vital Sign    Days of Exercise per Week: 2 days    Minutes of Exercise per Session: 30 min  Stress: No Stress Concern Present (04/26/2022)   [place]    Feeling of Stress : Not at all  Social Connections: Moderately Integrated (04/26/2022)   Social Connection and Isolation Panel [NHANES]    Frequency of Communication with Friends and Family: More than three times a week    Frequency of Social Gatherings with Friends and Family: More than three times a week    Attends Religious Services: More than 4 times per year    Active Member of [employer] or Organizations: No    Attends Music therapist: Never    Marital Status: Married    Tobacco Counseling Counseling given: Not Answered   Clinical Intake:  Pre-visit preparation completed: Yes  Pain : No/denies pain     BMI - recorded: 21.11 Nutritional Status: BMI of 19-24  Normal Nutritional Risks: None Diabetes: No  How often do you need to have someone help you when you read instructions, pamphlets, or other written materials from your doctor or pharmacy?: 1 - Never  Diabetic?no  Interpreter Needed?: No  Information entered by :: Charlott Rakes, LPN   Activities of Daily Living    04/26/2022   11:49 AM  In your present state of health, do you have any difficulty performing the following activities:  Hearing? 0  Vision? 0  Difficulty concentrating or making decisions? 0  Walking or climbing stairs? 0  Dressing or  bathing? 0  Doing errands, shopping? 0  Preparing Food and eating ? N  Using the Toilet? N  In the past six months, have you accidently leaked urine? N  Do you have problems with loss of bowel control? N  Managing your Medications? N  Managing your Finances? N  Housekeeping or managing your Housekeeping? N    Patient Care Team: Leamon Arnt, MD as PCP - General (Family Medicine) Delrae Rend, MD as Consulting Physician (Endocrinology) [facility] as Consulting Physician (Dermatology) Luretha Rued as Consulting Physician (Ophthalmology) Rozetta Nunnery, MD (  Inactive) as Consulting Physician (Otolaryngology) Maisie Fus, MD (Inactive) as Consulting Physician (Obstetrics and Gynecology) Marolyn Hammock, DMD as Consulting Physician (Dentistry) Macarthur Critchley, OD as Consulting Physician (Optometry) Armbruster, Carlota Raspberry, MD as Consulting Physician (Gastroenterology)  Indicate any recent Medical Services you may have received from other than [facility] providers in the past year (date may be approximate).     Assessment:   This is a routine wellness examination for [place].  Hearing/Vision screen Hearing Screening - Comments:: Pt denies any hearing issues  Vision Screening - Comments:: Pt follows up with Dr Katy Fitch offices for annual eye exams   Dietary issues and exercise activities discussed: Current Exercise Habits: Home exercise routine, Type of exercise: walking, Time (Minutes): 30, Frequency (Times/Week): 2, Weekly Exercise (Minutes/Week): 60   Goals Addressed             This Visit's Progress    Patient Stated       Continue exercising a little more        Depression Screen    04/26/2022   11:46 AM 04/11/2022   11:30 AM 02/16/2022    9:11 AM 12/15/2021    2:32 PM 08/30/2021   10:45 AM 07/21/2021   10:24 AM 06/22/2021   10:03 AM  PHQ 2/9 Scores  PHQ - 2 Score 0 0 0 0 0 0 0    Fall Risk    04/26/2022   11:48 AM 04/11/2022   11:29 AM 02/16/2022    9:11 AM  12/15/2021    2:32 PM 08/30/2021   10:42 AM  [place] in the past year? 0 0 0 0 0  Number falls in past yr: 0 0 0 0 0  Injury with Fall? 0 0 0 0 0  Risk for fall due to : Impaired vision No Fall Risks No Fall Risks No Fall Risks No Fall Risks  Follow up Falls prevention discussed Falls evaluation completed Falls evaluation completed Falls evaluation completed Falls evaluation completed    [place]:  Any stairs in or around the home? Yes  If so, are there any without handrails? No  Home free of loose throw rugs in walkways, pet beds, electrical cords, etc? Yes  Adequate lighting in your home to reduce risk of falls? Yes   ASSISTIVE DEVICES UTILIZED TO PREVENT FALLS:  Life alert? No  Use of a cane, walker or w/c? No  Grab bars in the bathroom? Yes  Shower chair or bench in shower? Yes  Elevated toilet seat or a handicapped toilet? No   TIMED UP AND GO:  Was the test performed? Yes .  Length of time to ambulate 10 feet: 10 sec.   Gait steady and fast without use of assistive device  Cognitive Function:        04/26/2022   11:50 AM 04/13/2021   10:29 AM 04/30/2019   12:50 PM  6CIT Screen  What Year? 0 points 0 points 0 points  What month? 0 points 0 points 0 points  What time? 0 points 0 points 0 points  Count back from 20 0 points 0 points 0 points  Months in reverse 0 points 0 points 0 points  Repeat phrase 0 points 0 points 0 points  Total Score 0 points 0 points 0 points    Immunizations Immunization History  Administered Date(s) Administered   Fluad Quad(high Dose 65+) 11/15/2019, 10/29/2020, 12/15/2021   H1N1 02/07/2008   Influenza Inj Mdck Quad  Pf 11/17/2017   Influenza Split 12/04/2010, 11/16/2011   Influenza Whole 11/14/2008, 11/14/2009, 11/15/2017   Influenza, High Dose Seasonal PF 11/15/2013, 11/20/2015, 11/25/2016, 11/08/2018   Influenza,inj,Quad PF,6+ Mos 11/15/2014   Influenza-Unspecified 10/15/2012,  11/16/2013   PFIZER(Purple Top)SARS-COV-2 Vaccination 03/13/2019, 04/03/2019, 01/21/2020   Pfizer Covid-19 Vaccine Bivalent Booster 31yr & up 11/20/2020   Pneumococcal Conjugate-13 06/17/2014   Pneumococcal Polysaccharide-23 05/16/2007   Tdap 11/14/2004, 07/06/2019    TDAP status: Up to date  Flu Vaccine status: Up to date  Pneumococcal vaccine status: Up to date  Covid-19 vaccine status: Completed vaccines  Qualifies for Shingles Vaccine? No    Screening Tests Health Maintenance  Topic Date Due   COVID-19 Vaccine (5 - 2023-24 season) 04/27/2022 (Originally 10/15/2021)   Medicare Annual Wellness (AWV)  04/26/2023   DTaP/Tdap/Td (3 - Td or Tdap) 07/05/2029   Pneumonia Vaccine 82 Years old  Completed   INFLUENZA VACCINE  Completed   HPV VACCINES  Aged Out   Zoster Vaccines- Shingrix  Discontinued    Health Maintenance  There are no preventive care reminders to display for this patient.   Colorectal cancer screening: No longer required.   Mammogram status: Completed 04/12/22. Repeat every year     Additional Screening:   Vision Screening: Recommended annual ophthalmology exams for early detection of glaucoma and other disorders of the eye. Is the patient up to date with their annual eye exam?  Yes  Who is the provider or what is the name of the office in which the patient attends annual eye exams? Dr [facility]  If pt is not established with a provider, would they like to be referred to a provider to establish care? No .   Dental Screening: Recommended annual dental exams for proper oral hygiene  Community Resource Referral / Chronic Care Management: CRR required this visit?  No   CCM required this visit?  No      Plan:     I have personally reviewed and noted the following in the patient's chart:   Medical and social history Use of alcohol, tobacco or illicit drugs  Current medications and supplements including opioid prescriptions. Patient is not  currently taking opioid prescriptions. Functional ability and status Nutritional status Physical activity Advanced directives List of other physicians Hospitalizations, surgeries, and ER visits in previous 12 months Vitals Screenings to include cognitive, depression, and falls Referrals and appointments  In addition, I have reviewed and discussed with patient certain preventive protocols, quality metrics, and best practice recommendations. A written personalized care plan for preventive services as well as general preventive health recommendations were provided to patient.     TWillette Brace LPN   [id number]  Nurse Notes: none

## 2022-04-26 NOTE — Patient Instructions (Signed)
[name] , Thank you for taking time to come for your Medicare Wellness Visit. I appreciate your ongoing commitment to your health goals. Please review the following plan we discussed and let me know if I can assist you in the future.   These are the goals we discussed:  Goals      Patient Stated     To address any thing in the future  Will get back to walking again. May add on slow      Patient Stated     Continue working on improving symptoms for thyroid         This is a list of the screening recommended for you and due dates:  Health Maintenance  Topic Date Due   COVID-19 Vaccine (5 - 2023-24 season) 04/27/2022*   Medicare Annual Wellness Visit  04/26/2023   DTaP/Tdap/Td vaccine (3 - Td or Tdap) 07/05/2029   Pneumonia Vaccine  Completed   Flu Shot  Completed   HPV Vaccine  Aged Out   Zoster (Shingles) Vaccine  Discontinued  *Topic was postponed. The date shown is not the original due date.    Advanced directives: Please bring a copy of your health care power of attorney and living will to the office at your convenience.  Conditions/risks identified: exercise more   Next appointment: Follow up in one year for your annual wellness visit    Preventive Care 65 Years and Older, Female Preventive care refers to lifestyle choices and visits with your health care provider that can promote health and wellness. What does preventive care include? A yearly physical exam. This is also called an annual well check. Dental exams once or twice a year. Routine eye exams. Ask your health care provider how often you should have your eyes checked. Personal lifestyle choices, including: Daily care of your teeth and gums. Regular physical activity. Eating a healthy diet. Avoiding tobacco and drug use. Limiting alcohol use. Practicing safe sex. Taking low-dose aspirin every day. Taking vitamin and mineral supplements as recommended by your health care provider. What happens during an  annual well check? The services and screenings done by your health care provider during your annual well check will depend on your age, overall health, lifestyle risk factors, and family history of disease. Counseling  Your health care provider may ask you questions about your: Alcohol use. Tobacco use. Drug use. Emotional well-being. Home and relationship well-being. Sexual activity. Eating habits. History of falls. Memory and ability to understand (cognition). Work and work Statistician. Reproductive health. Screening  You may have the following tests or measurements: Height, weight, and BMI. Blood pressure. Lipid and cholesterol levels. These may be checked every 5 years, or more frequently if you are over 73 years old. Skin check. Lung cancer screening. You may have this screening every year starting at age 42 if you have a 30-pack-year history of smoking and currently smoke or have quit within the past 15 years. Fecal occult blood test (FOBT) of the stool. You may have this test every year starting at age 22. Flexible sigmoidoscopy or colonoscopy. You may have a sigmoidoscopy every 5 years or a colonoscopy every 10 years starting at age 72. Hepatitis C blood test. Hepatitis B blood test. Sexually transmitted disease (STD) testing. Diabetes screening. This is done by checking your blood sugar (glucose) after you have not eaten for a while (fasting). You may have this done every 1-3 years. Bone density scan. This is done to screen for osteoporosis. You may  have this done starting at age 21. Mammogram. This may be done every 1-2 years. Talk to your health care provider about how often you should have regular mammograms. Talk with your health care provider about your test results, treatment options, and if necessary, the need for more tests. Vaccines  Your health care provider may recommend certain vaccines, such as: Influenza vaccine. This is recommended every year. Tetanus,  diphtheria, and acellular pertussis (Tdap, Td) vaccine. You may need a Td booster every 10 years. Zoster vaccine. You may need this after age 87. Pneumococcal 13-valent conjugate (PCV13) vaccine. One dose is recommended after age 31. Pneumococcal polysaccharide (PPSV23) vaccine. One dose is recommended after age 31. Talk to your health care provider about which screenings and vaccines you need and how often you need them. This information is not intended to replace advice given to you by your health care provider. Make sure you discuss any questions you have with your health care provider. Document Released: 02/27/2015 Document Revised: 10/21/2015 Document Reviewed: 12/02/2014 Elsevier Interactive Patient Education  2017 [employer] Prevention in the Home Falls can cause injuries. They can happen to people of all ages. There are many things you can do to make your home safe and to help prevent falls. What can I do on the outside of my home? Regularly fix the edges of walkways and driveways and fix any cracks. Remove anything that might make you trip as you walk through a door, such as a raised step or threshold. Trim any bushes or trees on the path to your home. Use bright outdoor lighting. Clear any walking paths of anything that might make someone trip, such as rocks or tools. Regularly check to see if handrails are loose or broken. Make sure that both sides of any steps have handrails. Any raised decks and porches should have guardrails on the edges. Have any leaves, snow, or ice cleared regularly. Use sand or salt on walking paths during winter. Clean up any spills in your garage right away. This includes oil or grease spills. What can I do in the bathroom? Use night lights. Install grab bars by the toilet and in the tub and shower. Do not use towel bars as grab bars. Use non-skid mats or decals in the tub or shower. If you need to sit down in the shower, use a plastic,  non-slip stool. Keep the floor dry. Clean up any water that spills on the floor as soon as it happens. Remove soap buildup in the tub or shower regularly. Attach bath mats securely with double-sided non-slip rug tape. Do not have throw rugs and other things on the floor that can make you trip. What can I do in the bedroom? Use night lights. Make sure that you have a light by your bed that is easy to reach. Do not use any sheets or blankets that are too big for your bed. They should not hang down onto the floor. Have a firm chair that has side arms. You can use this for support while you get dressed. Do not have throw rugs and other things on the floor that can make you trip. What can I do in the kitchen? Clean up any spills right away. Avoid walking on wet floors. Keep items that you use a lot in easy-to-reach places. If you need to reach something above you, use a strong step stool that has a grab bar. Keep electrical cords out of the way. Do not use floor polish  or wax that makes floors slippery. If you must use wax, use non-skid floor wax. Do not have throw rugs and other things on the floor that can make you trip. What can I do with my stairs? Do not leave any items on the stairs. Make sure that there are handrails on both sides of the stairs and use them. Fix handrails that are broken or loose. Make sure that handrails are as long as the stairways. Check any carpeting to make sure that it is firmly attached to the stairs. Fix any carpet that is loose or worn. Avoid having throw rugs at the top or bottom of the stairs. If you do have throw rugs, attach them to the floor with carpet tape. Make sure that you have a light switch at the top of the stairs and the bottom of the stairs. If you do not have them, ask someone to add them for you. What else can I do to help prevent falls? Wear shoes that: Do not have high heels. Have rubber bottoms. Are comfortable and fit you well. Are closed  at the toe. Do not wear sandals. If you use a stepladder: Make sure that it is fully opened. Do not climb a closed stepladder. Make sure that both sides of the stepladder are locked into place. Ask someone to hold it for you, if possible. Clearly mark and make sure that you can see: Any grab bars or handrails. First and last steps. Where the edge of each step is. Use tools that help you move around (mobility aids) if they are needed. These include: Canes. Walkers. Scooters. Crutches. Turn on the lights when you go into a dark area. Replace any light bulbs as soon as they burn out. Set up your furniture so you have a clear path. Avoid moving your furniture around. If any of your floors are uneven, fix them. If there are any pets around you, be aware of where they are. Review your medicines with your doctor. Some medicines can make you feel dizzy. This can increase your chance of falling. Ask your doctor what other things that you can do to help prevent falls. This information is not intended to replace advice given to you by your health care provider. Make sure you discuss any questions you have with your health care provider. Document Released: 11/27/2008 Document Revised: 07/09/2015 Document Reviewed: 03/07/2014 Elsevier Interactive Patient Education  2017 [employer].

## 2022-06-27 ENCOUNTER — Other Ambulatory Visit: Payer: Self-pay | Admitting: Family Medicine

## 2022-06-28 ENCOUNTER — Encounter: Payer: Medicare Other | Admitting: Family Medicine

## 2022-07-06 ENCOUNTER — Encounter: Payer: Medicare Other | Admitting: Family Medicine

## 2022-07-08 ENCOUNTER — Encounter: Payer: Self-pay | Admitting: Family Medicine

## 2022-07-08 ENCOUNTER — Ambulatory Visit (INDEPENDENT_AMBULATORY_CARE_PROVIDER_SITE_OTHER): Payer: Medicare Other | Admitting: Family Medicine

## 2022-07-08 VITALS — BP 130/80 | HR 64 | Temp 98.0°F | Ht 67.0 in | Wt 131.4 lb

## 2022-07-08 DIAGNOSIS — Z0000 Encounter for general adult medical examination without abnormal findings: Secondary | ICD-10-CM | POA: Diagnosis not present

## 2022-07-08 DIAGNOSIS — I10 Essential (primary) hypertension: Secondary | ICD-10-CM | POA: Diagnosis not present

## 2022-07-08 DIAGNOSIS — F5104 Psychophysiologic insomnia: Secondary | ICD-10-CM

## 2022-07-08 DIAGNOSIS — F419 Anxiety disorder, unspecified: Secondary | ICD-10-CM

## 2022-07-08 DIAGNOSIS — E039 Hypothyroidism, unspecified: Secondary | ICD-10-CM | POA: Diagnosis not present

## 2022-07-08 DIAGNOSIS — Z79899 Other long term (current) drug therapy: Secondary | ICD-10-CM

## 2022-07-08 DIAGNOSIS — M8589 Other specified disorders of bone density and structure, multiple sites: Secondary | ICD-10-CM

## 2022-07-08 LAB — CBC WITH DIFFERENTIAL/PLATELET
Basophils Absolute: 0 10*3/uL (ref 0.0–0.1)
Basophils Relative: 1.1 % (ref 0.0–3.0)
Eosinophils Absolute: 0 10*3/uL (ref 0.0–0.7)
Eosinophils Relative: 1.2 % (ref 0.0–5.0)
HCT: 40.4 % (ref 36.0–46.0)
Hemoglobin: 13.6 g/dL (ref 12.0–15.0)
Lymphocytes Relative: 30.8 % (ref 12.0–46.0)
Lymphs Abs: 1.2 10*3/uL (ref 0.7–4.0)
MCHC: 33.6 g/dL (ref 30.0–36.0)
MCV: 95.5 fl (ref 78.0–100.0)
Monocytes Absolute: 0.3 10*3/uL (ref 0.1–1.0)
Monocytes Relative: 9 % (ref 3.0–12.0)
Neutro Abs: 2.2 10*3/uL (ref 1.4–7.7)
Neutrophils Relative %: 57.9 % (ref 43.0–77.0)
Platelets: 182 10*3/uL (ref 150.0–400.0)
RBC: 4.23 Mil/uL (ref 3.87–5.11)
RDW: 13 % (ref 11.5–15.5)
WBC: 3.8 10*3/uL — ABNORMAL LOW (ref 4.0–10.5)

## 2022-07-08 LAB — COMPREHENSIVE METABOLIC PANEL
ALT: 12 U/L (ref 0–35)
AST: 14 U/L (ref 0–37)
Albumin: 4 g/dL (ref 3.5–5.2)
Alkaline Phosphatase: 56 U/L (ref 39–117)
BUN: 13 mg/dL (ref 6–23)
CO2: 30 mEq/L (ref 19–32)
Calcium: 9.4 mg/dL (ref 8.4–10.5)
Chloride: 102 mEq/L (ref 96–112)
Creatinine, Ser: 0.7 mg/dL (ref 0.40–1.20)
GFR: 80.13 mL/min (ref >60.00)
Glucose, Bld: 103 mg/dL — ABNORMAL HIGH (ref 70–99)
Potassium: 4.2 mEq/L (ref 3.5–5.1)
Sodium: 139 mEq/L (ref 135–145)
Total Bilirubin: 0.7 mg/dL (ref 0.2–1.2)
Total Protein: 6.4 g/dL (ref 6.0–8.3)

## 2022-07-08 LAB — LIPID PANEL
Cholesterol: 175 mg/dL (ref 0–200)
HDL: 76 mg/dL (ref >39.00)
LDL Cholesterol: 81 mg/dL (ref 0–99)
NonHDL: 98.71
Total CHOL/HDL Ratio: 2
Triglycerides: 88 mg/dL (ref 0.0–149.0)
VLDL: 17.6 mg/dL (ref 0.0–40.0)

## 2022-07-08 LAB — TSH: TSH: 0.94 u[IU]/mL (ref 0.35–5.50)

## 2022-07-08 NOTE — Patient Instructions (Signed)

## 2022-07-08 NOTE — Progress Notes (Signed)
Subjective  Chief Complaint  Patient presents with   Annual Exam    Pt here for Annual Exam and is currently fasting     HPI: Ashley [name] is a 82 y.o. female who presents to [facility] today for a Female Wellness Visit. She also has the concerns and/or needs as listed above in the chief complaint. These will be addressed in addition to the Health Maintenance Visit.   Wellness Visit: annual visit with health maintenance review and exam without Pap  HM: sees Dr. Renaldo Fiddler. Had nl mammo and I reviewed dexa: osteopenia is stable. She reports she is feeling very well.  Her grandson recently got married and she enjoyed those festivities.  Her anxiety is down.  Less headaches.  Appetite is good.  No mood concerns.  Her PHQ 2 is 0 today.  No falls. Chronic disease f/u and/or acute problem visit: (deemed necessary to be done in addition to the wellness visit): Hypothyroidism on levothyroxine 88 mcg daily.  Had TSH of 0.33 in March with her gynecologist.  Admits to occasional shakiness but overall no symptoms of hyperthyroidism.  She is compliant with medication. As above, chronic anxiety is well-controlled.  Continues on chronic lorazepam 1 mg nightly.  Does not use it during the day currently.  No panic attacks. Hypertension: She brings me a log with weekly blood pressures.  All are at goal.  She is happy about this.  Tolerating Bystolic 2.5 mg daily. Sleep is fair.  Assessment  1. Annual physical exam   2. Acquired hypothyroidism   3. Chronic anxiety   4. Chronic prescription benzodiazepine use   5. Essential hypertension   6. Psychophysiological insomnia   7. Osteopenia of multiple sites      Plan  Female Wellness Visit: Age appropriate Health Maintenance and Prevention measures were discussed with patient. Included topics are cancer screening recommendations, ways to keep healthy (see AVS) including dietary and exercise recommendations, regular eye and dental  care, use of seat belts, and avoidance of moderate alcohol use and tobacco use.  Up-to-date  BMI: discussed patient's BMI and encouraged positive lifestyle modifications to help get to or maintain a target BMI. HM needs and immunizations were addressed and ordered. See below for orders. See HM and immunization section for updates. Routine labs and screening tests ordered including cmp, cbc and lipids where appropriate. Discussed recommendations regarding Vit D and calcium supplementation (see AVS)  Chronic disease management visit and/or acute problem visit: Hypothyroidism: Recheck TSH on levothyroxine 88 mcg daily.  Will adjust dose down if TSH remains below goal.  Education given.  She is asymptomatic. Chronic anxiety on nightly benzos as well controlled.  Continue Ativan 1 mg at night.  Happy that she is doing better.  Again reinforced symptoms of her anxiety that include headaches, teeth clenching, palpitations and hypertension. Hypertension looks good.  Even good today in the office.  Continue Bystolic 2.5 mg daily.  Allow permissive hypertension to 160 over 90s given history of autonomic dysfunction.  Patient understands these goals. Osteopenia: Continue calcium and vitamin D.  Will recheck in 2 years  Follow up: 6 months to recheck blood pressure Orders Placed This Encounter  Procedures   CBC with Differential/Platelet   Comprehensive metabolic panel   Lipid panel   TSH   No orders of the defined types were placed in this encounter.     Body mass index is 20.58 kg/m. Wt Readings from Last 3 Encounters:  07/08/22 131 lb 6.4 oz (59.6 kg)  04/26/22 134 lb 12.8 oz (61.1 kg)  04/11/22 134 lb 3.2 oz (60.9 kg)     Patient Active Problem List   Diagnosis Date Noted   Acquired hypothyroidism 02/14/2019    Priority: High    Since age 63    Chronic prescription benzodiazepine use 02/14/2019    Priority: High    Ativan for years from prior pcps; uses for sleep     Psychophysiological insomnia 03/26/2018    Priority: High   Essential hypertension 06/17/2015    Priority: High   History of Hashimoto thyroiditis 2017 05/03/2007    Priority: High   Chronic anxiety 05/03/2007    Priority: High    Has failed SSRIs    Osteopenia 08/19/2013    Priority: Medium     Per GYN; has failed evista and biphosphonates; declines further screens, declines prolia.  2024 lowest T = -2.0; osteopenia. Per Dr. 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   Dysthymia 06/17/2015   Health Maintenance  Topic Date Due   COVID-19 Vaccine (5 - 2023-24 season) 07/24/2022 (Originally 10/15/2021)   INFLUENZA VACCINE  09/15/2022   Medicare Annual Wellness (AWV)  04/26/2023   DTaP/Tdap/Td (3 - Td or Tdap) 07/05/2029   Pneumonia Vaccine 65+ Years old  Completed   HPV VACCINES  Aged Out   Zoster Vaccines- Shingrix  Discontinued   Immunization History  Administered Date(s) Administered   Fluad Quad(high Dose 65+) 11/15/2019, 10/29/2020, 12/15/2021   H1N1 02/07/2008   Influenza Inj Mdck Quad Pf 11/17/2017   Influenza Split 12/04/2010, 11/16/2011   Influenza Whole 11/14/2008, 11/14/2009, 11/15/2017   Influenza, High Dose Seasonal PF 11/15/2013, 11/20/2015, 11/25/2016, 11/08/2018   Influenza,inj,Quad PF,6+ Mos 11/15/2014   Influenza-Unspecified 10/15/2012, 11/16/2013   PFIZER(Purple Top)SARS-COV-2 Vaccination 03/13/2019, 04/03/2019, 01/21/2020   Pfizer Covid-19 Vaccine Bivalent Booster 78yrs & up 11/20/2020   Pneumococcal Conjugate-13 06/17/2014   Pneumococcal Polysaccharide-23 05/16/2007   Tdap 11/14/2004, 07/06/2019   We updated and reviewed the patient's past history in  detail and it is documented below. Allergies: Patient is allergic to macrobid [nitrofurantoin], alendronate sodium, penicillins, raloxifene, risedronate sodium, shellfish-derived products, citalopram, and latex. Past Medical History Patient  has a past medical history of Diverticulosis of colon (without mention of hemorrhage), Hypertension, Osteoporosis, unspecified, Personal history of other malignant neoplasm of skin, and Unspecified hypothyroidism. Past Surgical History Patient  has a past surgical history that includes Colonoscopy and uterine polyp removal. Family History: Patient family history includes Breast cancer in her maternal aunt and paternal aunt; Diabetes in an other family member; Ovarian cancer in her mother; Pancreatic cancer in her brother; Prostate cancer in her brother. Social History:  Patient  reports that she has never smoked. She has never used smokeless tobacco. She reports that she does not drink alcohol and does not use drugs.  Review of Systems: Constitutional: negative for fever or malaise Ophthalmic: negative for photophobia, double vision or loss of vision Cardiovascular: negative for chest pain, dyspnea on exertion, or new LE swelling Respiratory: negative for SOB or persistent cough Gastrointestinal: negative for abdominal pain, change in bowel habits or melena Genitourinary: negative for dysuria or gross  hematuria, no abnormal uterine bleeding or disharge Musculoskeletal: negative for new gait disturbance or muscular weakness Integumentary: negative for new or persistent rashes, no breast lumps Neurological: negative for TIA or stroke symptoms Psychiatric: negative for SI or delusions Allergic/Immunologic: negative for hives  Patient Care Team    Relationship Specialty Notifications Start End  Willow Ora, MD PCP - General Family Medicine  02/14/19   Talmage Coin, MD Consulting Physician Endocrinology  09/14/17   [facility] Consulting  Physician Dermatology  12/25/17   Ronney Asters Consulting Physician Ophthalmology  12/25/17   Drema Halon, MD (Inactive) Consulting Physician Otolaryngology  12/25/17   Freddy Finner, MD (Inactive) Consulting Physician Obstetrics and Gynecology  12/25/17   Nicola Police, DMD Consulting Physician Dentistry  12/25/17   Fredrich Birks, OD Consulting Physician Optometry  04/30/19   Armbruster, Willaim Rayas, MD Consulting Physician Gastroenterology  04/30/19     Objective  Vitals: BP 130/80   Pulse 64   Temp 98 F (36.7 C)   Ht 5\' 7"  (1.702 m)   Wt 131 lb 6.4 oz (59.6 kg)   SpO2 98%   BMI 20.58 kg/m  General:  Well developed, well nourished, no acute distress  Psych:  Alert and orientedx3,normal mood and affect, very happy today HEENT:  Normocephalic, atraumatic, non-icteric sclera,  supple neck without adenopathy, mass or thyromegaly Cardiovascular:  Normal S1, S2, RRR without gallop, rub or murmur Respiratory:  Good breath sounds bilaterally, CTAB with normal respiratory effort Gastrointestinal: normal bowel sounds, soft, non-tender, no noted masses. No HSM MSK: extremities without edema, joints without erythema or swelling Neurologic:    Mental status is normal.  Gross motor and sensory exams are normal.  No tremor  Commons side effects, risks, benefits, and alternatives for medications and treatment plan prescribed today were discussed, and the patient expressed understanding of the given instructions. Patient is instructed to call or message via MyChart if he/she has any questions or concerns regarding our treatment plan. No barriers to understanding were identified. We discussed Red Flag symptoms and signs in detail. Patient expressed understanding regarding what to do in case of urgent or emergency type symptoms.  Medication list was reconciled, printed and provided to the patient in AVS. Patient instructions and summary information was reviewed with the patient as documented in  the AVS. This note was prepared with assistance of Dragon voice recognition software. Occasional wrong-word or sound-a-like substitutions may have occurred due to the inherent limitations of voice recognition software

## 2022-07-12 NOTE — Progress Notes (Signed)
See my chart note.

## 2022-07-14 ENCOUNTER — Telehealth: Payer: Self-pay | Admitting: Family Medicine

## 2022-07-14 NOTE — Telephone Encounter (Signed)
Patient would like Dr Mardelle Matte to give her a call regarding a question/issue she is having with pain in her eye and she is concerned it may be from her thyroid. She requested to be called if possible after 3:00 pm on [phone].  Thank you,  Ashley [name] [facility] AWV TEAM Direct Dial [phone]

## 2022-07-14 NOTE — Telephone Encounter (Signed)
Spoke with pt regarding lab results. 

## 2022-07-24 ENCOUNTER — Other Ambulatory Visit: Payer: Self-pay | Admitting: Family Medicine

## 2022-09-05 ENCOUNTER — Other Ambulatory Visit: Payer: Self-pay | Admitting: Family Medicine

## 2022-09-05 DIAGNOSIS — F411 Generalized anxiety disorder: Secondary | ICD-10-CM

## 2022-09-05 NOTE — Telephone Encounter (Signed)
LOV: 07/08/2022  Last Fill Date: 03/21/2022  Qty: 60  Refills: 5

## 2022-10-12 ENCOUNTER — Other Ambulatory Visit: Payer: Self-pay | Admitting: Family Medicine

## 2022-10-12 DIAGNOSIS — F411 Generalized anxiety disorder: Secondary | ICD-10-CM

## 2022-11-01 ENCOUNTER — Ambulatory Visit (INDEPENDENT_AMBULATORY_CARE_PROVIDER_SITE_OTHER): Payer: Medicare Other

## 2022-11-01 ENCOUNTER — Other Ambulatory Visit: Payer: Self-pay

## 2022-11-01 DIAGNOSIS — Z23 Encounter for immunization: Secondary | ICD-10-CM

## 2022-12-05 ENCOUNTER — Telehealth: Payer: Self-pay | Admitting: Family Medicine

## 2022-12-05 NOTE — Telephone Encounter (Signed)
Patient wants to know if pcp thinks she should get the COVID booster.  Please Advise.

## 2023-03-09 ENCOUNTER — Other Ambulatory Visit: Payer: Self-pay | Admitting: Family Medicine

## 2023-03-31 ENCOUNTER — Other Ambulatory Visit: Payer: Self-pay | Admitting: Family Medicine

## 2023-03-31 DIAGNOSIS — F411 Generalized anxiety disorder: Secondary | ICD-10-CM

## 2023-03-31 NOTE — Telephone Encounter (Signed)
Copied from CRM [id number]. Topic: Clinical - Medication Refill >> Mar 31, 2023  4:26 PM Ashley [name] wrote: Most Recent Primary Care Visit:  Provider: Trudie Reed [name]  Department: [facility]  Visit Type: NURSE VISIT  Date: 11/01/2022  Medication: LORazepam (ATIVAN) 1 MG tablet  Has the patient contacted their pharmacy? Yes (Agent: If no, request that the patient contact the pharmacy for the refill. If patient does not wish to contact the pharmacy document the reason why and proceed with request.) (Agent: If yes, when and what did the pharmacy advise?)  Is this the correct pharmacy for this prescription? Yes If no, delete pharmacy and type the correct one.  This is the patient's preferred pharmacy:  [id number] [place], [place] - [address] AT [facility] [address] [facility] [address] [place] [address] Phone: [phone] Fax: [phone]  Has the prescription been filled recently? Yes  Is the patient out of the medication? No  Has the patient been seen for an appointment in the last year OR does the patient have an upcoming appointment? Yes  Can we respond through MyChart? No  Agent: Please be advised that Rx refills may take up to 3 business days. We ask that you follow-up with your pharmacy.

## 2023-03-31 NOTE — Telephone Encounter (Signed)
Last Fill: 10/12/22  Last OV: 07/08/22 Next OV: 08/03/23 AWV  Routing to provider for review/authorization.

## 2023-04-03 ENCOUNTER — Other Ambulatory Visit: Payer: Self-pay | Admitting: Family Medicine

## 2023-04-03 ENCOUNTER — Telehealth: Payer: Self-pay

## 2023-04-03 DIAGNOSIS — F411 Generalized anxiety disorder: Secondary | ICD-10-CM

## 2023-04-03 NOTE — Telephone Encounter (Signed)
Copied from CRM [id number]. Topic: Clinical - Medication Refill >> Apr 03, 2023  4:14 PM Alcus Dad wrote: Most Recent Primary Care Visit:  Provider: Trudie Reed A  Department: [facility]  Visit Type: NURSE VISIT  Date: 11/01/2022  Medication: LORazepam (ATIVAN) 1 MG tablet  Has the patient contacted their pharmacy? Yes (Agent: If no, request that the patient contact the pharmacy for the refill. If patient does not wish to contact the pharmacy document the reason why and proceed with request.) (Agent: If yes, when and what did the pharmacy advise?)  Is this the correct pharmacy for this prescription? Yes If no, delete pharmacy and type the correct one.  This is the patient's preferred pharmacy:  [id number] [place], [place] - [address] AT [facility] [address] & [facility] [address] [place] [address] Phone: [phone] Fax: [phone]  [place] - [facility] [address] [place] [address] Phone: [phone] Fax: [phone]  [facility]) - [place], [place] - [address] [facility] [address] [place] [address] [place] [address] Phone: [phone] Fax: [phone]  [place] - [facility] [address] [place] [address] Phone: [phone] Fax: [phone]  [facility] - [place], [place] - [address] [place] [address] Phone: [phone] Fax: [phone]  [facility] [address] [place], [place] - [address] [address] Phone: [phone] Fax: [phone]   Has the prescription been filled recently? No  Is the patient out of the medication? No  Has the patient been seen for an appointment in the last year OR does the patient have an upcoming appointment? Yes  Can we respond through MyChart? Yes  Agent: Please be advised that Rx refills  may take up to 3 business days. We ask that you follow-up with your pharmacy.

## 2023-04-03 NOTE — Telephone Encounter (Signed)
Copied from CRM [id number]. Topic: Clinical - Prescription Issue >> Apr 03, 2023  9:36 AM Adele Barthel wrote: Reason for CRM:   Patient calling regarding refill for lorazepam. Advised she had 2 refills remaining for medication. She has spoken with pharmacy and they advised they are showing no refills. Patient mentioned her husband takes lorazepam as well and when looking at his medications, he is showing 0 remaining refills left. Advised the pharmacy may be looking at her husband's account when advising she would need a refill request sent from provider.   Patient wants to confirm she has remaining refills and is requesting call back from clinic.  CB# [phone]  Please Advise. I do not tink she has any refills, but ur input would be helpful.  Message has been sent to the provider to address

## 2023-04-03 NOTE — Telephone Encounter (Signed)
07/08/2022 LOV  10/12/2022 Fill Date  60/5 refills

## 2023-04-04 MED ORDER — LORAZEPAM 1 MG PO TABS
0.5000 mg | ORAL_TABLET | Freq: Two times a day (BID) | ORAL | 2 refills | Status: DC | PRN
Start: 2023-04-04 — End: 2023-08-23

## 2023-04-04 NOTE — Addendum Note (Signed)
Addended by: Asencion Partridge on: 04/04/2023 08:58 AM   Modules accepted: Orders

## 2023-04-06 ENCOUNTER — Encounter: Payer: Self-pay | Admitting: Family Medicine

## 2023-06-19 ENCOUNTER — Other Ambulatory Visit: Payer: Self-pay | Admitting: Family Medicine

## 2023-06-19 MED ORDER — SERTRALINE HCL 25 MG PO TABS: 25.0000 mg | ORAL_TABLET | Freq: Every day | ORAL | 1 refills | Status: DC

## 2023-06-19 NOTE — Telephone Encounter (Signed)
 Last Fill: 06/27/22  Last OV: 07/08/22 Next OV: 08/03/23 AWV  Routing to provider for review/authorization.

## 2023-06-19 NOTE — Telephone Encounter (Signed)
 Copied from CRM [id number]. Topic: Clinical - Medication Refill >> Jun 19, 2023 10:04 AM Adonis Hoot wrote: Most Recent Primary Care Visit:  Provider: Shelton Dibbles A  Department: [facility]  Visit Type: NURSE VISIT  Date: 11/01/2022  Medication: sertraline  (ZOLOFT ) 25 MG tablet  Has the patient contacted their pharmacy? Yes (Agent: If no, request that the patient contact the pharmacy for the refill. If patient does not wish to contact the pharmacy document the reason why and proceed with request.) (Agent: If yes, when and what did the pharmacy advise?)  Is this the correct pharmacy for this prescription?  If no, delete pharmacy and type the correct one.  This is the patient's preferred pharmacy:  [facility] #[id number] [place], [place] - [address] AT [facility] OF [facility] [address] & [facility] [address] [place] [address] Phone: [phone] Fax: [phone]    Has the prescription been filled recently? Yes  Is the patient out of the medication? Yno(7 pills left)  Has the patient been seen for an appointment in the last year OR does the patient have an upcoming appointment? Yes  Can we respond through MyChart? Yes  Agent: Please be advised that Rx refills may take up to 3 business days. We ask that you follow-up with your pharmacy.

## 2023-07-11 ENCOUNTER — Other Ambulatory Visit: Payer: Self-pay | Admitting: Family Medicine

## 2023-07-11 NOTE — Telephone Encounter (Unsigned)
 Copied from CRM [id number]. Topic: Clinical - Medication Refill >> Jul 11, 2023  2:37 PM Shereese L wrote: Medication: nebivolol  (BYSTOLIC ) 2.5 MG tablet  Has the patient contacted their pharmacy? Yes (Agent: If no, request that the patient contact the pharmacy for the refill. If patient does not wish to contact the pharmacy document the reason why and proceed with request.) (Agent: If yes, when and what did the pharmacy advise?)  This is the patient's preferred pharmacy:  [facility]ORE #[id number] [place], [place] - [address] AT [facility] OF [facility] [address] & [facility] [address] [place] [address] Phone: [phone] Fax: [phone]   Is this the correct pharmacy for this prescription? Yes If no, delete pharmacy and type the correct one.   Has the prescription been filled recently? No  Is the patient out of the medication? Yes  Has the patient been seen for an appointment in the last year OR does the patient have an upcoming appointment? Yes  Can we respond through MyChart? No  Agent: Please be advised that Rx refills may take up to 3 business days. We ask that you follow-up with your pharmacy.

## 2023-07-13 ENCOUNTER — Other Ambulatory Visit: Payer: Self-pay

## 2023-07-13 ENCOUNTER — Encounter: Payer: Self-pay | Admitting: Family Medicine

## 2023-07-13 MED ORDER — NEBIVOLOL HCL 2.5 MG PO TABS: 2.5000 mg | ORAL_TABLET | Freq: Every day | ORAL | 3 refills | Status: DC

## 2023-07-13 NOTE — Telephone Encounter (Signed)
 Patient is calling back in regarding her blood pressure medication she is needing this medication filled today she is almost out of her medication she would like a call back regarding this she also want's to know why does it take as long has it does for her medication to get refilled

## 2023-07-13 NOTE — Telephone Encounter (Signed)
 patient

## 2023-08-01 ENCOUNTER — Other Ambulatory Visit: Payer: Self-pay | Admitting: Family Medicine

## 2023-08-03 ENCOUNTER — Ambulatory Visit: Payer: Medicare Other

## 2023-08-22 ENCOUNTER — Telehealth: Payer: Self-pay | Admitting: Family Medicine

## 2023-08-22 NOTE — Telephone Encounter (Unsigned)
 Copied from CRM [id number]. Topic: Clinical - Medication Refill >> Aug 22, 2023 12:14 PM Rosina BIRCH wrote: Medication: sertraline  (ZOLOFT ) 25 MG tablet  Has the patient contacted their pharmacy? No (Agent: If no, request that the patient contact the pharmacy for the refill. If patient does not wish to contact the pharmacy document the reason why and proceed with request.) (Agent: If yes, when and what did the pharmacy advise?)  This is the patient's preferred pharmacy:  [facility] #[id number] [place], [place] - [address] AT [facility] [address] [facility] [address] & [facility] [address] [place] [address] Phone: [phone] Fax: [phone]  Is this the correct pharmacy for this prescription? Yes If no, delete pharmacy and type the correct one.   Has the prescription been filled recently? {yes/no:20286}  Is the patient out of the medication? No  Has the patient been seen for an appointment in the last year OR does the patient have an upcoming appointment? {yes/no:20286}  Can we respond through MyChart? Yes  Agent: Please be advised that Rx refills may take up to 3 business days. We ask that you follow-up with your pharmacy.

## 2023-08-23 ENCOUNTER — Other Ambulatory Visit: Payer: Self-pay | Admitting: Family Medicine

## 2023-08-23 DIAGNOSIS — F411 Generalized anxiety disorder: Secondary | ICD-10-CM

## 2023-08-23 NOTE — Telephone Encounter (Unsigned)
 Copied from CRM [id number]. Topic: Clinical - Medication Refill >> Aug 23, 2023  3:41 PM Suzen RAMAN wrote: Medication: LORazepam  (ATIVAN ) 1 MG tablet  Has the patient contacted their pharmacy? Yes   This is the patient's preferred pharmacy:  [facility] #[id number] [place], [place] - [address] AT [facility] [address] [facility] [address] & [facility] [address] [place] [address] Phone: [phone] Fax: [phone]   Is this the correct pharmacy for this prescription? Yes If no, delete pharmacy and type the correct one.   Has the prescription been filled recently? No  Is the patient out of the medication? No  Has the patient been seen for an appointment in the last year OR does the patient have an upcoming appointment? Yes  Can we respond through MyChart? Yes  Agent: Please be advised that Rx refills may take up to 3 business days. We ask that you follow-up with your pharmacy.

## 2023-08-24 ENCOUNTER — Telehealth: Payer: Self-pay

## 2023-08-24 ENCOUNTER — Other Ambulatory Visit: Payer: Self-pay

## 2023-08-24 MED ORDER — LORAZEPAM 1 MG PO TABS: 0.5000 mg | ORAL_TABLET | Freq: Two times a day (BID) | ORAL | 5 refills | Status: DC | PRN

## 2023-08-24 MED ORDER — SERTRALINE HCL 25 MG PO TABS: 25.0000 mg | ORAL_TABLET | Freq: Every day | ORAL | 0 refills | Status: DC

## 2023-08-24 NOTE — Telephone Encounter (Signed)
 07/08/2022 LOV  04/04/2023 fill date  60/5 refills

## 2023-08-24 NOTE — Telephone Encounter (Signed)
 Copied from CRM [id number]. Topic: Clinical - Prescription Issue >> Aug 24, 2023  3:10 PM Robinson H wrote: Reason for CRM: Patients medication refill for the sertraline  (ZOLOFT ) 25 MG tablet is pending approval due to patient needing an appointment. Patient has a medication refill appointment scheduled for 8/7, please reach out patient has a couple more pills left.  Terrin [phone]

## 2023-09-21 ENCOUNTER — Ambulatory Visit: Admitting: Family Medicine

## 2023-09-22 ENCOUNTER — Encounter: Payer: Self-pay | Admitting: Family Medicine

## 2023-09-22 ENCOUNTER — Ambulatory Visit: Admitting: Family Medicine

## 2023-09-22 VITALS — BP 177/76 | HR 57 | Temp 97.9°F | Ht 67.0 in | Wt 135.0 lb

## 2023-09-22 DIAGNOSIS — Z0001 Encounter for general adult medical examination with abnormal findings: Secondary | ICD-10-CM

## 2023-09-22 DIAGNOSIS — I10 Essential (primary) hypertension: Secondary | ICD-10-CM | POA: Diagnosis not present

## 2023-09-22 DIAGNOSIS — E559 Vitamin D deficiency, unspecified: Secondary | ICD-10-CM

## 2023-09-22 DIAGNOSIS — E039 Hypothyroidism, unspecified: Secondary | ICD-10-CM

## 2023-09-22 DIAGNOSIS — I951 Orthostatic hypotension: Secondary | ICD-10-CM

## 2023-09-22 DIAGNOSIS — F5104 Psychophysiologic insomnia: Secondary | ICD-10-CM

## 2023-09-22 DIAGNOSIS — F411 Generalized anxiety disorder: Secondary | ICD-10-CM

## 2023-09-22 DIAGNOSIS — Z79899 Other long term (current) drug therapy: Secondary | ICD-10-CM | POA: Diagnosis not present

## 2023-09-22 LAB — COMPREHENSIVE METABOLIC PANEL WITH GFR
ALT: 13 U/L (ref 0–35)
AST: 16 U/L (ref 0–37)
Albumin: 4.2 g/dL (ref 3.5–5.2)
Alkaline Phosphatase: 55 U/L (ref 39–117)
BUN: 15 mg/dL (ref 6–23)
CO2: 31 meq/L (ref 19–32)
Calcium: 9.8 mg/dL (ref 8.4–10.5)
Chloride: 99 meq/L (ref 96–112)
Creatinine, Ser: 0.66 mg/dL (ref 0.40–1.20)
GFR: 80.58 mL/min (ref >60.00)
Glucose, Bld: 95 mg/dL (ref 70–99)
Potassium: 4 meq/L (ref 3.5–5.1)
Sodium: 138 meq/L (ref 135–145)
Total Bilirubin: 0.6 mg/dL (ref 0.2–1.2)
Total Protein: 6.7 g/dL (ref 6.0–8.3)

## 2023-09-22 LAB — CBC WITH DIFFERENTIAL/PLATELET
Basophils Absolute: 0 K/uL (ref 0.0–0.1)
Basophils Relative: 0.9 % (ref 0.0–3.0)
Eosinophils Absolute: 0 K/uL (ref 0.0–0.7)
Eosinophils Relative: 1 % (ref 0.0–5.0)
HCT: 41.1 % (ref 36.0–46.0)
Hemoglobin: 13.7 g/dL (ref 12.0–15.0)
Lymphocytes Relative: 31.5 % (ref 12.0–46.0)
Lymphs Abs: 1.1 K/uL (ref 0.7–4.0)
MCHC: 33.3 g/dL (ref 30.0–36.0)
MCV: 94.2 fl (ref 78.0–100.0)
Monocytes Absolute: 0.3 K/uL (ref 0.1–1.0)
Monocytes Relative: 10.2 % (ref 3.0–12.0)
Neutro Abs: 1.9 K/uL (ref 1.4–7.7)
Neutrophils Relative %: 56.4 % (ref 43.0–77.0)
Platelets: 179 K/uL (ref 150.0–400.0)
RBC: 4.36 Mil/uL (ref 3.87–5.11)
RDW: 13.1 % (ref 11.5–15.5)
WBC: 3.4 K/uL — ABNORMAL LOW (ref 4.0–10.5)

## 2023-09-22 LAB — LIPID PANEL
Cholesterol: 177 mg/dL (ref 0–200)
HDL: 79.9 mg/dL (ref >39.00)
LDL Cholesterol: 75 mg/dL (ref 0–99)
NonHDL: 96.66
Total CHOL/HDL Ratio: 2
Triglycerides: 108 mg/dL (ref 0.0–149.0)
VLDL: 21.6 mg/dL (ref 0.0–40.0)

## 2023-09-22 LAB — TSH: TSH: 0.75 u[IU]/mL (ref 0.35–5.50)

## 2023-09-22 MED ORDER — NEBIVOLOL HCL 2.5 MG PO TABS: 2.5000 mg | ORAL_TABLET | Freq: Every day | ORAL | 3 refills | Status: AC

## 2023-09-22 MED ORDER — SERTRALINE HCL 25 MG PO TABS: 25.0000 mg | ORAL_TABLET | Freq: Every day | ORAL | 3 refills | Status: AC

## 2023-09-22 NOTE — Patient Instructions (Signed)
 Please return in 6 months for hypertension follow up. For follow up on chronic medical conditions   I will release your lab results to you on your MyChart account with further instructions. You may see the results before I do, but when I review them I will send you a message with my report or have my assistant call you if things need to be discussed. Please reply to my message with any questions. Thank you!   If you have any questions or concerns, please don't hesitate to send me a message via MyChart or call the office at [phone]. Thank you for visiting with us  today! It's our pleasure caring for you.   Check your blood pressue at home to make sure it is stable: ideally always < 160/100, but a little lower is better. I have refilled your medications.

## 2023-09-22 NOTE — Progress Notes (Signed)
 Subjective  Chief Complaint  Patient presents with   Hypertension   Anxiety    HPI: Ashley [name] is a 83 y.o. female who presents to [facility] today for a Female Wellness Visit. She also has the concerns and/or needs as listed above in the chief complaint. These will be addressed in addition to the Health Maintenance Visit.   Wellness Visit: annual visit with health maintenance review and exam  HM: dexa last year with [facility], lowest T equals -2.0 and stable.  No other screenings indicated at this time.  Feeling well.  Eating well.  No concerns eye exam current.  No cognitive concerns Chronic disease f/u and/or acute problem visit: (deemed necessary to be done in addition to the wellness visit): Due for follow-up of chronic problems including hypothyroidism, hypertension, insomnia and anxiety.  Fortunately she is doing well.  Compliant with her thyroid  medications.  Energy is good.  Sleep is good.  No chest pain or shortness of breath.  Has not been checking her home blood pressures.  Mildly elevated today but unlikely completely accurate.  She does have autonomic dysfunction with history of orthostatic hypotension so blood pressure goals are 140s to 160s over 90s to 100.  She takes her low-dose beta-blocker without adverse effects.  Anxiety is well-controlled on sertraline .  Due for lab work  Assessment  1. Acquired hypothyroidism   2. Essential hypertension   3. Chronic prescription benzodiazepine use   4. Psychophysiological insomnia   5. Vitamin D  deficiency   6. GAD (generalized anxiety disorder)      Plan  Female Wellness Visit: Age appropriate Health Maintenance and Prevention measures were discussed with patient. Included topics are cancer screening recommendations, ways to keep healthy (see AVS) including dietary and exercise recommendations, regular eye and dental care, use of seat belts, and avoidance of moderate alcohol use and tobacco  use.  BMI: discussed patient's BMI and encouraged positive lifestyle modifications to help get to or maintain a target BMI. HM needs and immunizations were addressed and ordered. See below for orders. See HM and immunization section for updates. Routine labs and screening tests ordered including cmp, cbc and lipids where appropriate. Discussed recommendations regarding Vit D and calcium supplementation (see AVS)  Chronic disease management visit and/or acute problem visit: Hypertension: She will start monitoring at home again to ensure that blood pressure is at goal.  Continue Bystolic  2.5 mg daily.  Check renal function electrolytes. Due for thyroid  check.  Clinically stable. Refilled sertraline .  Mood and anxiety levels are well-controlled.  Continues on chronic benzos.  No adverse effects.  Understands risk versus benefits.  Follow up: 6 months for blood pressure check and follow-up No orders of the defined types were placed in this encounter.  No orders of the defined types were placed in this encounter.     Body mass index is 21.14 kg/m. Wt Readings from Last 3 Encounters:  09/22/23 135 lb (61.2 kg)  07/08/22 131 lb 6.4 oz (59.6 kg)  04/26/22 134 lb 12.8 oz (61.1 kg)     Patient Active Problem List   Diagnosis Date Noted   Acquired hypothyroidism 02/14/2019    Priority: High    Since age 74    Chronic prescription benzodiazepine use 02/14/2019    Priority: High    Ativan  for years from prior pcps; uses for sleep    Psychophysiological insomnia 03/26/2018    Priority: High   Essential hypertension 06/17/2015  Priority: High   History of Hashimoto thyroiditis 2017 05/03/2007    Priority: High   Chronic anxiety 05/03/2007    Priority: High    Has failed SSRIs    Osteopenia 08/19/2013    Priority: Medium     Per GYN; has failed evista and biphosphonates; declines further screens, declines prolia.  2024 lowest T = -2.0; osteopenia. Per Dr. 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   Dysthymia 06/17/2015   Health Maintenance  Topic Date Due   Medicare Annual Wellness (AWV)  04/26/2023   INFLUENZA VACCINE  09/15/2023   COVID-19 Vaccine (5 - 2024-25 season) 10/08/2023 (Originally 10/16/2022)   DTaP/Tdap/Td (3 - Td or Tdap) 07/05/2029   Pneumococcal Vaccine: 50+ Years  Completed   Hepatitis B Vaccines  Aged Out   HPV VACCINES  Aged Out   Meningococcal B Vaccine  Aged Out   Zoster Vaccines- Shingrix   Discontinued   Immunization History  Administered Date(s) Administered   Fluad Quad(high Dose 65+) 11/15/2019, 10/29/2020, 12/15/2021   Fluad Trivalent(High Dose 65+) 11/01/2022   H1N1 02/07/2008   Influenza Inj Mdck Quad Pf 11/17/2017   Influenza Split 12/04/2010, 11/16/2011   Influenza Whole 11/14/2008, 11/14/2009, 11/15/2017   Influenza, High Dose Seasonal PF 11/15/2013, 11/20/2015, 11/25/2016, 11/08/2018   Influenza,inj,Quad PF,6+ Mos 11/15/2014   Influenza-Unspecified 10/15/2012, 11/16/2013   PFIZER(Purple Top)SARS-COV-2 Vaccination 03/13/2019, 04/03/2019, 01/21/2020   Pfizer Covid-19 Vaccine Bivalent Booster 94yrs & up 11/20/2020   Pneumococcal Conjugate-13 06/17/2014   Pneumococcal Polysaccharide-23 05/16/2007   Tdap 11/14/2004, 07/06/2019   We updated and reviewed the patient's past history in detail and it is documented below. Allergies: Patient is allergic to macrobid  [nitrofurantoin ], alendronate sodium, penicillins, raloxifene, risedronate sodium, shellfish-derived products, citalopram , and latex. Past Medical History Patient  has a past medical history of Diverticulosis of colon (without mention of hemorrhage), Hypertension,  Osteoporosis, unspecified, Personal history of other malignant neoplasm of skin, and Unspecified hypothyroidism. Past Surgical History Patient  has a past surgical history that includes Colonoscopy and uterine polyp removal. Family History: Patient family history includes Breast cancer in her maternal aunt and paternal aunt; Diabetes in an other family member; Ovarian cancer in her mother; Pancreatic cancer in her brother; Prostate cancer in her brother. Social History:  Patient  reports that she has never smoked. She has never used smokeless tobacco. She reports that she does not drink alcohol and does not use drugs.  Review of Systems: Constitutional: negative for fever or malaise Ophthalmic: negative for photophobia, double vision or loss of vision Cardiovascular: negative for chest pain, dyspnea on exertion, or new LE swelling Respiratory: negative for SOB or persistent cough Gastrointestinal: negative for abdominal pain, change in bowel habits or melena Genitourinary: negative for dysuria or gross hematuria, no abnormal uterine bleeding or disharge Musculoskeletal: negative for new gait disturbance or muscular weakness Integumentary: negative for new or persistent rashes, no breast lumps Neurological: negative for TIA or stroke symptoms Psychiatric: negative for SI or delusions Allergic/Immunologic: negative for hives  Patient Care Team    Relationship Specialty Notifications Start End  Jodie Lavern CROME, MD PCP - General Family Medicine  02/14/19   Faythe Purchase, MD Consulting Physician  Endocrinology  09/14/17   [facility] Consulting Physician Dermatology  12/25/17   Norleen Hamilton Consulting Physician Ophthalmology  12/25/17   Ethyl Lonni BRAVO, MD (Inactive) Consulting Physician Otolaryngology  12/25/17   Rosalynn LELON Ingle, MD (Inactive) Consulting Physician Obstetrics and Gynecology  12/25/17   Encarnacion Norleen Lenis, DMD Consulting Physician Dentistry  12/25/17   Hamilton Simmonds, OD  Consulting Physician Optometry  04/30/19   Armbruster, Elspeth SQUIBB, MD Consulting Physician Gastroenterology  04/30/19     Objective  Vitals: BP (!) 177/76   Pulse (!) 57   Temp 97.9 F (36.6 C)   Ht 5' 7 (1.702 m)   Wt 135 lb (61.2 kg)   SpO2 97%   BMI 21.14 kg/m  General:  Well developed, well nourished, no acute distress, looks great Psych:  Alert and orientedx3,normal mood and affect HEENT:  Normocephalic, atraumatic, non-icteric sclera,  supple neck without adenopathy, mass or thyromegaly Cardiovascular:  Normal S1, S2, RRR without gallop, rub or murmur Respiratory:  Good breath sounds bilaterally, CTAB with normal respiratory effort Gastrointestinal: normal bowel sounds, soft, non-tender, no noted masses. No HSM MSK: extremities without edema, joints without erythema or swelling Neurologic:    Mental status is normal.  Gross motor and sensory exams are normal.  No tremor  Commons side effects, risks, benefits, and alternatives for medications and treatment plan prescribed today were discussed, and the patient expressed understanding of the given instructions. Patient is instructed to call or message via MyChart if he/she has any questions or concerns regarding our treatment plan. No barriers to understanding were identified. We discussed Red Flag symptoms and signs in detail. Patient expressed understanding regarding what to do in case of urgent or emergency type symptoms.  Medication list was reconciled, printed and provided to the patient in AVS. Patient instructions and summary information was reviewed with the patient as documented in the AVS. This note was prepared with assistance of Dragon voice recognition software. Occasional wrong-word or sound-a-like substitutions may have occurred due to the inherent limitations of voice recognition software

## 2023-09-26 ENCOUNTER — Ambulatory Visit

## 2023-09-26 VITALS — BP 147/79 | Temp 96.0°F | Ht 67.0 in | Wt 135.0 lb

## 2023-09-26 DIAGNOSIS — Z0000 Encounter for general adult medical examination without abnormal findings: Secondary | ICD-10-CM

## 2023-09-26 NOTE — Patient Instructions (Signed)
 Ms. Bilger , Thank you for taking time out of your busy schedule to complete your Annual Wellness Visit with me. I enjoyed our conversation and look forward to speaking with you again next year. I, as well as your care team,  appreciate your ongoing commitment to your health goals. Please review the following plan we discussed and let me know if I can assist you in the future. Your Game plan/ To Do List    Referrals: If you haven't heard from the office you've been referred to, please reach out to them at the phone provided.   Follow up Visits: We will see or speak with you next year for your Next Medicare AWV with our clinical staff Have you seen your provider in the last 6 months (3 months if uncontrolled diabetes)? Yes  Clinician Recommendations:  Each day, aim for 6 glasses of water, plenty of protein in your diet and try to get up and walk/ stretch every hour for 5-10 minutes at a time.        This is a list of the screenings recommended for you:  Health Maintenance  Topic Date Due   Medicare Annual Wellness Visit  04/26/2023   Flu Shot  09/15/2023   COVID-19 Vaccine (5 - 2024-25 season) 10/08/2023*   DTaP/Tdap/Td vaccine (3 - Td or Tdap) 07/05/2029   Pneumococcal Vaccine for age over 34  Completed   Hepatitis B Vaccine  Aged Out   HPV Vaccine  Aged Out   Meningitis B Vaccine  Aged Out   Zoster (Shingles) Vaccine  Discontinued  *Topic was postponed. The date shown is not the original due date.    Advanced directives: (Copy Requested) Please bring a copy of your health care power of attorney and living will to the office to be added to your chart at your convenience. You can mail to [facility] [address] [place], [place] [address] or email to [email or web address] Advance Care Planning is important because it:  [x]  Makes sure you receive the medical care that is consistent with your values, goals, and preferences  [x]  It provides guidance to your family  and loved ones and reduces their decisional burden about whether or not they are making the right decisions based on your wishes.  Follow the link provided in your after visit summary or read over the paperwork we have mailed to you to help you started getting your Advance Directives in place. If you need assistance in completing these, please reach out to us  so that we can help you!  See attachments for Preventive Care and Fall Prevention Tips.

## 2023-09-26 NOTE — Progress Notes (Signed)
 Subjective:   Ashley [name] is a 83 y.o. who presents for a Medicare Wellness preventive visit.  As a reminder, Annual Wellness Visits don't include a physical exam, and some assessments may be limited, especially if this visit is performed virtually. We may recommend an in-person follow-up visit with your provider if needed.  Visit Complete: Virtual I connected with  Ashley [name] on 09/26/23 by a audio enabled telemedicine application and verified that I am speaking with the correct person using two identifiers.  Patient Location: Home  Provider Location: Office/Clinic  I discussed the limitations of evaluation and management by telemedicine. The patient expressed understanding and agreed to proceed.  Vital Signs: Because this visit was a virtual/telehealth visit, some criteria may be missing or patient reported. Any vitals not documented were not able to be obtained and vitals that have been documented are patient reported.  VideoDeclined- This patient declined Librarian, academic. Therefore the visit was completed with audio only.  Persons Participating in Visit: Patient.  AWV Questionnaire: No: Patient Medicare AWV questionnaire was not completed prior to this visit.  Cardiac Risk Factors include: advanced age (>66men, >16 women);hypertension     Objective:    Today's Vitals   09/26/23 1042  Weight: 135 lb (61.2 kg)  Height: 5' 7 (1.702 m)   Body mass index is 21.14 kg/m.     09/26/2023   10:52 AM 04/26/2022   11:48 AM 04/13/2021   10:23 AM 12/16/2020   11:43 AM 04/30/2019   12:43 PM 12/28/2017    9:53 AM 12/26/2016    8:41 AM  Advanced Directives  Does Patient Have a Medical Advance Directive? Yes Yes Yes No Yes Yes    Type of Estate agent of [place];Living will Healthcare Power of [place];Living will Healthcare Power of Attorney  Living will;Healthcare Power of Attorney    Does patient want to make changes to  medical advance directive?     No - Patient declined  Yes (MAU/Ambulatory/Procedural Areas - Information given)   Copy of Healthcare Power of Attorney in Chart? No - copy requested No - copy requested No - copy requested  No - copy requested    Would patient like information on creating a medical advance directive?    No - Patient declined        Data saved with a previous flowsheet row definition    Current Medications (verified) Outpatient Encounter Medications as of 09/26/2023  Medication Sig   acetaminophen (TYLENOL) 325 MG tablet Take 650 mg by mouth every 6 (six) hours as needed.   Calcium Carb-Cholecalciferol 600-800 MG-UNIT TABS Take 1 tablet by mouth daily.   docusate sodium (COLACE) 100 MG capsule Take 100 mg by mouth daily as needed. As needed   estradiol (ESTRACE) 0.1 MG/GM vaginal cream Place 2 Applicatorfuls vaginally at bedtime as needed.   LORazepam  (ATIVAN ) 1 MG tablet Take 0.5-1 tablets (0.5-1 mg total) by mouth 2 (two) times daily as needed for anxiety or sleep.   Multiple Vitamins-Minerals (PRESERVISION AREDS PO) Take 1 tablet by mouth daily.   nebivolol  (BYSTOLIC ) 2.5 MG tablet Take 1 tablet (2.5 mg total) by mouth daily.   polyethylene glycol (MIRALAX / GLYCOLAX) 17 g packet Take 17 g by mouth daily.   PREVIDENT 5000 DRY MOUTH 1.1 % GEL dental gel SMARTSIG:sparingly   sertraline  (ZOLOFT ) 25 MG tablet Take 1 tablet (25 mg total) by mouth daily.   SYNTHROID  88 MCG tablet TAKE 1 TABLET BY MOUTH  DAILY IN  THE MORNING ON AN EMPTY STOMACH   Vitamin D , Cholecalciferol, 50 MCG (2000 UT) CAPS Take 1 capsule by mouth daily.   Wheat Dextrin (BENEFIBER DRINK MIX PO) Take by mouth. As needed   No facility-administered encounter medications on file as of 09/26/2023.    Allergies (verified) Macrobid  [nitrofurantoin ], Alendronate sodium, Penicillins, Raloxifene, Risedronate sodium, Shellfish-derived products, Citalopram , and Latex   History: Past Medical History:  Diagnosis Date    Diverticulosis of colon (without mention of hemorrhage)    Hypertension    Osteoporosis, unspecified    Personal history of other malignant neoplasm of skin    Unspecified hypothyroidism    Past Surgical History:  Procedure Laterality Date   COLONOSCOPY     uterine polyp removal     Family History  Problem Relation Age of Onset   Ovarian cancer Mother    Pancreatic cancer Brother    Diabetes Other    Prostate cancer Brother    Breast cancer Maternal Aunt    Breast cancer Paternal Aunt    Colon cancer Neg Hx    Esophageal cancer Neg Hx    Stomach cancer Neg Hx    Liver disease Neg Hx    Social History   Socioeconomic History   Marital status: Married    Spouse name: Lamar   Number of children: 2   Years of education: Not on file   Highest education level: Not on file  Occupational History   Occupation: Retired  Tobacco Use   Smoking status: Never   Smokeless tobacco: Never  Vaping Use   Vaping status: Never Used  Substance and Sexual Activity   Alcohol use: No   Drug use: No   Sexual activity: Yes  Other Topics Concern   Not on file  Social History Narrative   Not on file   Social Drivers of Health   Financial Resource Strain: Low Risk  (09/26/2023)   Overall Financial Resource Strain (CARDIA)    Difficulty of Paying Living Expenses: Not hard at all  Food Insecurity: No Food Insecurity (09/26/2023)   Hunger Vital Sign    Worried About Running Out of Food in the Last Year: Never true    Ran Out of Food in the Last Year: Never true  Transportation Needs: No Transportation Needs (09/26/2023)   PRAPARE - Administrator, Civil Service (Medical): No    Lack of Transportation (Non-Medical): No  Physical Activity: Inactive (09/26/2023)   Exercise Vital Sign    Days of Exercise per Week: 0 days    Minutes of Exercise per Session: 0 min  Stress: No Stress Concern Present (09/26/2023)   [employer] of Occupational Health - Occupational Stress  Questionnaire    Feeling of Stress: Not at all  Social Connections: Moderately Integrated (09/26/2023)   Social Connection and Isolation Panel    Frequency of Communication with Friends and Family: More than three times a week    Frequency of Social Gatherings with Friends and Family: Three times a week    Attends Religious Services: 1 to 4 times per year    Active Member of Clubs or Organizations: No    Attends Banker Meetings: Never    Marital Status: Married    Tobacco Counseling Counseling given: Not Answered    Clinical Intake:  Pre-visit preparation completed: Yes  Pain : No/denies pain     BMI - recorded: 21.14 Nutritional Status: BMI of 19-24  Normal Nutritional Risks: None Diabetes:  No  Lab Results  Component Value Date   HGBA1C 5.2 12/26/2016   HGBA1C 5.8 04/29/2008   HGBA1C 5.7 05/04/2007     How often do you need to have someone help you when you read instructions, pamphlets, or other written materials from your doctor or pharmacy?: 1 - Never  Interpreter Needed?: No  Information entered by :: Ellouise Haws, LPN   Activities of Daily Living     09/26/2023   10:46 AM  In your present state of health, do you have any difficulty performing the following activities:  Hearing? 0  Vision? 0  Difficulty concentrating or making decisions? 0  Walking or climbing stairs? 0  Dressing or bathing? 0  Doing errands, shopping? 0  Preparing Food and eating ? N  Using the Toilet? N  In the past six months, have you accidently leaked urine? N  Do you have problems with loss of bowel control? N  Managing your Medications? N  Managing your Finances? N  Housekeeping or managing your Housekeeping? N    Patient Care Team: Jodie Lavern CROME, MD as PCP - General (Family Medicine) Faythe Purchase, MD as Consulting Physician (Endocrinology) [facility] as Consulting Physician (Dermatology) Norleen Hamilton as Consulting Physician  (Ophthalmology) Ethyl Lonni BRAVO, MD (Inactive) as Consulting Physician (Otolaryngology) Rosalynn LELON Ingle, MD (Inactive) as Consulting Physician (Obstetrics and Gynecology) Encarnacion Norleen Lenis, DMD as Consulting Physician (Dentistry) Hamilton Simmonds, OD as Consulting Physician (Optometry) Armbruster, Elspeth SQUIBB, MD as Consulting Physician (Gastroenterology)  I have updated your Care Teams any recent Medical Services you may have received from other providers in the past year.     Assessment:   This is a routine wellness examination for [place].  Hearing/Vision screen Hearing Screening - Comments:: Pt denies any hearing issues  Vision Screening - Comments:: Wears rx glasses - up to date with routine eye exams with Dr Octavia    Goals Addressed             This Visit's Progress    Patient Stated       Pursue to be more active        Depression Screen     09/26/2023   10:49 AM 09/22/2023   10:51 AM 07/08/2022   10:00 AM 04/26/2022   11:46 AM 04/11/2022   11:30 AM 02/16/2022    9:11 AM 12/15/2021    2:32 PM  PHQ 2/9 Scores  PHQ - 2 Score 0 0 0 0 0 0 0    Fall Risk     09/26/2023   10:51 AM 09/22/2023   10:51 AM 07/08/2022   10:00 AM 04/26/2022   11:48 AM 04/11/2022   11:29 AM  Fall Risk   Falls in the past year? 0 0 0 0 0  Number falls in past yr: 0 0 0 0 0  Injury with Fall? 0 0 0 0 0  Risk for fall due to : No Fall Risks No Fall Risks No Fall Risks Impaired vision No Fall Risks  Follow up Falls prevention discussed Falls evaluation completed Falls evaluation completed Falls prevention discussed Falls evaluation completed    MEDICARE RISK AT HOME:  Medicare Risk at Home Any stairs in or around the home?: No If so, are there any without handrails?: No Home free of loose throw rugs in walkways, pet beds, electrical cords, etc?: Yes Adequate lighting in your home to reduce risk of falls?: Yes Life alert?: Yes (i phone) Use of a cane, walker or  w/c?: No Grab bars in the bathroom?:  Yes Shower chair or bench in shower?: Yes Elevated toilet seat or a handicapped toilet?: Yes  TIMED UP AND GO:  Was the test performed?  No  Cognitive Function: 6CIT completed    12/28/2017    9:54 AM  MMSE - Mini Mental State Exam  Not completed: --        09/26/2023   10:54 AM 04/26/2022   11:50 AM 04/13/2021   10:29 AM 04/30/2019   12:50 PM  6CIT Screen  What Year? 0 points 0 points 0 points 0 points  What month? 0 points 0 points 0 points 0 points  What time? 0 points 0 points 0 points 0 points  Count back from 20 0 points 0 points 0 points 0 points  Months in reverse 0 points 0 points 0 points 0 points  Repeat phrase 0 points 0 points 0 points 0 points  Total Score 0 points 0 points 0 points 0 points    Immunizations Immunization History  Administered Date(s) Administered   Fluad Quad(high Dose 65+) 11/15/2019, 10/29/2020, 12/15/2021   Fluad Trivalent(High Dose 65+) 11/01/2022   H1N1 02/07/2008   Influenza Inj Mdck Quad Pf 11/17/2017   Influenza Split 12/04/2010, 11/16/2011   Influenza Whole 11/14/2008, 11/14/2009, 11/15/2017   Influenza, High Dose Seasonal PF 11/15/2013, 11/20/2015, 11/25/2016, 11/08/2018   Influenza,inj,Quad PF,6+ Mos 11/15/2014   Influenza-Unspecified 10/15/2012, 11/16/2013   PFIZER(Purple Top)SARS-COV-2 Vaccination 03/13/2019, 04/03/2019, 01/21/2020   Pfizer Covid-19 Vaccine Bivalent Booster 21yrs & up 11/20/2020   Pneumococcal Conjugate-13 06/17/2014   Pneumococcal Polysaccharide-23 05/16/2007   Tdap 11/14/2004, 07/06/2019    Screening Tests Health Maintenance  Topic Date Due   INFLUENZA VACCINE  09/15/2023   COVID-19 Vaccine (5 - 2024-25 season) 10/08/2023 (Originally 10/16/2022)   Medicare Annual Wellness (AWV)  09/25/2024   DTaP/Tdap/Td (3 - Td or Tdap) 07/05/2029   Pneumococcal Vaccine: 50+ Years  Completed   Hepatitis B Vaccines  Aged Out   HPV VACCINES  Aged Out   Meningococcal B Vaccine  Aged Out   Zoster Vaccines- Shingrix    Discontinued    Health Maintenance  Health Maintenance Due  Topic Date Due   INFLUENZA VACCINE  09/15/2023   Health Maintenance Items Addressed: See Nurse Notes at the end of this note  Additional Screening:  Vision Screening: Recommended annual ophthalmology exams for early detection of glaucoma and other disorders of the eye. Would you like a referral to an eye doctor? No    Dental Screening: Recommended annual dental exams for proper oral hygiene  Community Resource Referral / Chronic Care Management: CRR required this visit?  No   CCM required this visit?  No   Plan:    I have personally reviewed and noted the following in the patient's chart:   Medical and social history Use of alcohol, tobacco or illicit drugs  Current medications and supplements including opioid prescriptions. Patient is not currently taking opioid prescriptions. Functional ability and status Nutritional status Physical activity Advanced directives List of other physicians Hospitalizations, surgeries, and ER visits in previous 12 months Vitals Screenings to include cognitive, depression, and falls Referrals and appointments  In addition, I have reviewed and discussed with patient certain preventive protocols, quality metrics, and best practice recommendations. A written personalized care plan for preventive services as well as general preventive health recommendations were provided to patient.   Ellouise VEAR Haws, LPN   [id number]   After Visit Summary: (MyChart) Due to this being a  telephonic visit, the after visit summary with patients personalized plan was offered to patient via MyChart   Notes: Nothing significant to report at this time.

## 2023-10-01 ENCOUNTER — Ambulatory Visit: Payer: Self-pay | Admitting: Family Medicine

## 2023-10-01 NOTE — Progress Notes (Signed)
 See mychart note

## 2023-11-06 ENCOUNTER — Encounter: Admitting: Family Medicine

## 2023-11-06 ENCOUNTER — Ambulatory Visit (INDEPENDENT_AMBULATORY_CARE_PROVIDER_SITE_OTHER)

## 2023-11-06 DIAGNOSIS — Z23 Encounter for immunization: Secondary | ICD-10-CM | POA: Diagnosis not present

## 2024-03-13 ENCOUNTER — Other Ambulatory Visit: Payer: Self-pay | Admitting: Family Medicine

## 2024-03-13 DIAGNOSIS — F411 Generalized anxiety disorder: Secondary | ICD-10-CM

## 2024-03-13 NOTE — Telephone Encounter (Signed)
 09/22/2023 LOV  08/24/2023 fill date  60/5 refills

## 2024-03-26 ENCOUNTER — Ambulatory Visit: Admitting: Family Medicine

## 2024-05-08 ENCOUNTER — Encounter: Admitting: Family Medicine
